# Patient Record
Sex: Female | Born: 1935 | Race: White | Hispanic: No | Smoking: Former smoker
Health system: Southern US, Community
[De-identification: ages and names within clinical notes are randomized; demographics above are authoritative.]

## PROBLEM LIST (undated history)

## (undated) DIAGNOSIS — F324 Major depressive disorder, single episode, in partial remission: Secondary | ICD-10-CM

## (undated) DIAGNOSIS — E785 Hyperlipidemia, unspecified: Secondary | ICD-10-CM

## (undated) DIAGNOSIS — F419 Anxiety disorder, unspecified: Secondary | ICD-10-CM

## (undated) DIAGNOSIS — E559 Vitamin D deficiency, unspecified: Secondary | ICD-10-CM

## (undated) DIAGNOSIS — A419 Sepsis, unspecified organism: Secondary | ICD-10-CM

## (undated) DIAGNOSIS — E1100 Type 2 diabetes mellitus with hyperosmolarity without nonketotic hyperglycemic-hyperosmolar coma (NKHHC): Secondary | ICD-10-CM

## (undated) DIAGNOSIS — J3089 Other allergic rhinitis: Secondary | ICD-10-CM

## (undated) DIAGNOSIS — B029 Zoster without complications: Secondary | ICD-10-CM

## (undated) DIAGNOSIS — L409 Psoriasis, unspecified: Secondary | ICD-10-CM

## (undated) DIAGNOSIS — T7840XA Allergy, unspecified, initial encounter: Secondary | ICD-10-CM

## (undated) HISTORY — DX: Other allergic rhinitis: J30.89

## (undated) HISTORY — PX: ABDOMINAL HYSTERECTOMY: SHX81

## (undated) HISTORY — PX: PARTIAL HYSTERECTOMY: SHX80

## (undated) HISTORY — DX: Vitamin D deficiency, unspecified: E55.9

## (undated) HISTORY — PX: EYE SURGERY: SHX253

## (undated) HISTORY — PX: BREAST BIOPSY: SHX20

## (undated) HISTORY — DX: Major depressive disorder, single episode, in partial remission: F32.4

## (undated) HISTORY — DX: Anxiety disorder, unspecified: F41.9

## (undated) HISTORY — DX: Hyperlipidemia, unspecified: E78.5

## (undated) HISTORY — PX: BREAST CYST EXCISION: SHX579

## (undated) HISTORY — DX: Type 2 diabetes mellitus with hyperosmolarity without nonketotic hyperglycemic-hyperosmolar coma (NKHHC): E11.00

## (undated) HISTORY — DX: Allergy, unspecified, initial encounter: T78.40XA

## (undated) HISTORY — DX: Psoriasis, unspecified: L40.9

## (undated) NOTE — *Deleted (*Deleted)
05/19/2020 5:00 PM   Jordan Montoya 23-Jan-1934 [id number]  Referring provider: Reubin Milan, MD [address] [place],  [place] [address] No chief complaint on file.   HPI: Jordan Montoya is a 29 y.o. female who returns for a 2 month follow up of UTI.   She was diagnosed with E. coli UTIs in March and June 2021 that presented with symptoms of dysuria, pelvic pain, urgency, and incontinence.   The patient last saw Dr. Richardo Hanks on 03/18/2020. Most recently she was started on Premarin cream, cranberry tablet prophylaxis, and Myrbetriq.  She reported 4 to 5 days of worsening urinary symptoms including urgency, incontinence, and dysuria consistent with prior UTI symptoms. Urinalysis was concerning for infection with 0 squamous cells, greater than 50 WBCs, 6-10 RBCs, no bacteria, small leukocytes, nitrite negative.  Sent for culture. He recommended Cipro 500 mg twice daily x5 days for suspected UTI.  Today, ***  PMH: Past Medical History:  Diagnosis Date  . Allergy   . Anxiety   . Avitaminosis D 11/25/2014  . Environmental and seasonal allergies 05/30/2015  . HLD (hyperlipidemia) 08/20/2011   Overview:  LDL goal <100 given DM2   . 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 Psoriasis 11/25/2014   followed by Dermatology   . Shingles 11/2018  . Type 2 diabetes mellitus with hyperosmolarity without coma, without long-term current use of insulin (HCC) 09/30/2015  . Unspecified septicemia(038.9) (HCC) 02/28/2019    Surgical History: Past Surgical History:  Procedure Laterality Date  . BREAST BIOPSY Left    neg-bx/clip  . BREAST CYST EXCISION Left    neg  . EYE SURGERY Bilateral    cataracts  . PARTIAL HYSTERECTOMY     one ovary remains    Home Medications:  Allergies as of 05/19/2020      Reactions   Metformin Nausea Only      Medication List       Accurate as of May 18, 2020  5:00 PM. If you have  any questions, ask your nurse or doctor.        acetaminophen 650 MG CR tablet Commonly known as: TYLENOL Take 650 mg by mouth every 8 (eight) hours as needed for pain.   ALPRAZolam 0.25 MG tablet Commonly known as: XANAX Take 1 tablet (0.25 mg total) by mouth daily as needed. for anxiety   aspirin 325 MG EC tablet Take 325 mg by mouth daily. As needed   Cosentyx Sensoready (300 MG) 150 MG/ML Soaj Generic drug: Secukinumab (300 MG Dose)   CRANBERRY PO Take 2 tablets by mouth daily.   docusate sodium 100 MG capsule Commonly known as: COLACE Take 200 mg by mouth 2 (two) times daily as needed for mild constipation.   ibuprofen 200 MG tablet Commonly known as: ADVIL Take 400 mg by mouth every 8 (eight) hours as needed.   Januvia 100 MG tablet Generic drug: sitaGLIPtin TAKE 1 TABLET (100 MG TOTAL) BY MOUTH DAILY.   metoprolol tartrate 25 MG tablet Commonly known as: LOPRESSOR Take 0.5 tablets by mouth daily.   mirabegron ER 25 MG Tb24 tablet Commonly known as: MYRBETRIQ Take 1 tablet (25 mg total) by mouth daily.   naproxen sodium 220 MG tablet Commonly known as: ALEVE Take 220 mg by mouth.   nitrofurantoin 50 MG capsule Commonly known as: MACRODANTIN Take 1 capsule (50 mg total) by mouth daily. Take daily for UTI prevention after completing course of Cipro  pregabalin 100 MG capsule Commonly known as: LYRICA Take 1 capsule (100 mg total) by mouth 2 (two) times daily.   Premarin vaginal cream Generic drug: conjugated estrogens Apply 0.5mg  (pea-sized amount)  just inside the vaginal introitus with a finger-tip on  Monday, Wednesday and Friday nights.   PROBIOTIC-10 PO Take by mouth.   traMADol-acetaminophen 37.5-325 MG tablet Commonly known as: ULTRACET Take 1 tablet by mouth 2 (two) times daily as needed.       Allergies:  Allergies  Allergen Reactions  . Metformin Nausea Only    Family History: Family History  Problem Relation Age of Onset  .  Cancer Mother   . Heart disease Father   . Diabetes Son   . Breast cancer Neg Hx     Social History:  reports that she quit smoking about 40 years ago. Her smoking use included cigarettes. She has a 5.00 pack-year smoking history. She has never used smokeless tobacco. She reports that she does not drink alcohol and does not use drugs.   Physical Exam: There were no vitals taken for this visit.  Constitutional:  Alert and oriented, No acute distress. HEENT: [place] AT, moist mucus membranes.  Trachea midline, no masses. Cardiovascular: No clubbing, cyanosis, or edema. Respiratory: Normal respiratory effort, no increased work of breathing. GI: Abdomen is soft, nontender, nondistended, no abdominal masses GU: No CVA tenderness Lymph: No cervical or inguinal lymphadenopathy. Skin: No rashes, bruises or suspicious lesions. Neurologic: Grossly intact, no focal deficits, moving all 4 extremities. Psychiatric: Normal mood and affect.  Laboratory Data:  Lab Results  Component Value Date   CREATININE 0.90 04/14/2020    No results found for: PSA  No results found for: TESTOSTERONE  Lab Results  Component Value Date   HGBA1C 6.5 (H) 04/02/2020    Urinalysis   Pertinent Imaging: *** No results found for this or any previous visit.  Results for orders placed during the hospital encounter of 02/28/19  US Venous Img Lower Bilateral  Narrative CLINICAL DATA:  Bilateral leg edema  EXAM: BILATERAL LOWER EXTREMITY VENOUS DOPPLER ULTRASOUND  TECHNIQUE: Gray-scale sonography with graded compression, as well as color Doppler and duplex ultrasound were performed to evaluate the lower extremity deep venous systems from the level of the common femoral vein and including the common femoral, femoral, profunda femoral, popliteal and calf veins including the posterior tibial, peroneal and gastrocnemius veins when visible. The superficial great saphenous vein was also interrogated. Spectral  Doppler was utilized to evaluate flow at rest and with distal augmentation maneuvers in the common femoral, femoral and popliteal veins.  COMPARISON:  None.  FINDINGS: RIGHT LOWER EXTREMITY  Common Femoral Vein: No evidence of thrombus. Normal compressibility, respiratory phasicity and response to augmentation.  Saphenofemoral Junction: No evidence of thrombus. Normal compressibility and flow on color Doppler imaging.  Profunda Femoral Vein: No evidence of thrombus. Normal compressibility and flow on color Doppler imaging.  Femoral Vein: No evidence of thrombus. Normal compressibility, respiratory phasicity and response to augmentation.  Popliteal Vein: No evidence of thrombus. Normal compressibility, respiratory phasicity and response to augmentation.  Calf Veins: No evidence of thrombus. Normal compressibility and flow on color Doppler imaging.  LEFT LOWER EXTREMITY  Common Femoral Vein: No evidence of thrombus. Normal compressibility, respiratory phasicity and response to augmentation.  Saphenofemoral Junction: No evidence of thrombus. Normal compressibility and flow on color Doppler imaging.  Profunda Femoral Vein: No evidence of thrombus. Normal compressibility and flow on color Doppler imaging.  Femoral Vein: No  evidence of thrombus. Normal compressibility, respiratory phasicity and response to augmentation.  Popliteal Vein: No evidence of thrombus. Normal compressibility, respiratory phasicity and response to augmentation.  Calf Veins: No evidence of thrombus. Normal compressibility and flow on color Doppler imaging.  IMPRESSION: No evidence of deep venous thrombosis in either lower extremity.   Electronically Signed By: Jasmine Pang M.D. On: 02/28/2019 03:27  No results found for this or any previous visit.  No results found for this or any previous visit.  No results found for this or any previous visit.  No results found for this or any previous  visit.  No results found for this or any previous visit.  No results found for this or any previous visit.   Assessment & Plan:     No follow-ups on file.  [facility] [address], [address] [place], [place] [address] [phone]  I, Theador Hawthorne, am acting as a Neurosurgeon for [employer],  {Add [employer] Statement}

---
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
## 2002-08-02 LAB — HM COLONOSCOPY

## 2010-08-02 LAB — HM DEXA SCAN: HM DEXA SCAN: NORMAL

## 2011-08-20 DIAGNOSIS — E1169 Type 2 diabetes mellitus with other specified complication: Secondary | ICD-10-CM | POA: Insufficient documentation

## 2011-08-20 DIAGNOSIS — E785 Hyperlipidemia, unspecified: Secondary | ICD-10-CM

## 2011-08-20 HISTORY — DX: Hyperlipidemia, unspecified: E78.5

## 2012-01-04 ENCOUNTER — Ambulatory Visit: Payer: Self-pay | Admitting: Family Medicine

## 2012-04-12 ENCOUNTER — Ambulatory Visit: Payer: Self-pay | Admitting: Ophthalmology

## 2012-05-29 ENCOUNTER — Ambulatory Visit: Payer: Self-pay | Admitting: Medical

## 2013-01-25 ENCOUNTER — Ambulatory Visit: Payer: Self-pay | Admitting: Internal Medicine

## 2013-12-31 LAB — HM MAMMOGRAPHY: HM MAMMO: NORMAL

## 2014-01-28 ENCOUNTER — Ambulatory Visit: Payer: Self-pay | Admitting: Internal Medicine

## 2014-01-29 ENCOUNTER — Ambulatory Visit: Payer: Self-pay | Admitting: Internal Medicine

## 2014-04-29 ENCOUNTER — Ambulatory Visit: Payer: Self-pay | Admitting: Anesthesiology

## 2014-04-29 LAB — CBC WITH DIFFERENTIAL/PLATELET
Basophil #: 0 10*3/uL (ref 0.0–0.1)
Basophil %: 0.6 %
Eosinophil #: 0.1 10*3/uL (ref 0.0–0.7)
Eosinophil %: 2.4 %
HCT: 36.3 % (ref 35.0–47.0)
HGB: 11.3 g/dL — AB (ref 12.0–16.0)
LYMPHS ABS: 2 10*3/uL (ref 1.0–3.6)
LYMPHS PCT: 32 %
MCH: 28.1 pg (ref 26.0–34.0)
MCHC: 31.2 g/dL — AB (ref 32.0–36.0)
MCV: 90 fL (ref 80–100)
MONO ABS: 0.6 x10 3/mm (ref 0.2–0.9)
MONOS PCT: 9.1 %
Neutrophil #: 3.5 10*3/uL (ref 1.4–6.5)
Neutrophil %: 55.9 %
Platelet: 245 10*3/uL (ref 150–440)
RBC: 4.03 10*6/uL (ref 3.80–5.20)
RDW: 13.9 % (ref 11.5–14.5)
WBC: 6.2 10*3/uL (ref 3.6–11.0)

## 2014-04-29 LAB — BASIC METABOLIC PANEL
ANION GAP: 10 (ref 7–16)
BUN: 15 mg/dL (ref 7–18)
CO2: 25 mmol/L (ref 21–32)
Calcium, Total: 8.9 mg/dL (ref 8.5–10.1)
Chloride: 106 mmol/L (ref 98–107)
Creatinine: 0.91 mg/dL (ref 0.60–1.30)
EGFR (Non-African Amer.): >60
GLUCOSE: 163 mg/dL — AB (ref 65–99)
Osmolality: 286 (ref 275–301)
POTASSIUM: 4.2 mmol/L (ref 3.5–5.1)
Sodium: 141 mmol/L (ref 136–145)

## 2014-05-02 ENCOUNTER — Ambulatory Visit: Payer: Self-pay | Admitting: Surgery

## 2014-05-03 LAB — PATHOLOGY REPORT

## 2014-05-07 ENCOUNTER — Ambulatory Visit: Payer: Self-pay | Admitting: Family Medicine

## 2014-10-04 LAB — HEMOGLOBIN A1C: HEMOGLOBIN A1C: 7.3 % — AB (ref 4.0–6.0)

## 2014-11-15 ENCOUNTER — Other Ambulatory Visit: Payer: Self-pay | Admitting: Internal Medicine

## 2014-11-15 DIAGNOSIS — Z1231 Encounter for screening mammogram for malignant neoplasm of breast: Secondary | ICD-10-CM

## 2014-11-23 NOTE — Op Note (Signed)
PATIENT NAME:  Jordan Montoya, Jordan Montoya MR#:  [id number] DATE OF BIRTH:  January 30, 1934  DATE OF PROCEDURE:  05/02/2014  PREOPERATIVE DIAGNOSIS: Left breast mass.   POSTOPERATIVE DIAGNOSIS: Left breast mass.   OPERATION: Excision of left breast mass.   ANESTHESIA: General.   SURGEON: Tiney Rougealph Ely III, MD.   OPERATIVE PROCEDURE: With the patient in the supine position after induction of appropriate general anesthesia the patient's left breast was prepped with ChloraPrep and draped with sterile towels. An elliptical incision was made around the palpable mass. The incision was carried down through the subcutaneous tissue with Bovie electrocautery down to the chest wall. The lesion was swept off the chest without difficulty. It was marked with margin markers. It was closed with a combination of 3 and 4-0 nylon. Sterile dressings were applied. The patient was returned to the recovery room, having tolerated the procedure well. Sponge and instrument counts were correct x 2 in the operating room.     ____________________________ Carmie Endalph L. Ely III, MD rle:bu D: 05/02/2014 10:31:00 ET T: 05/02/2014 12:58:21 ET JOB#: [id number]  cc: Bari EdwardLaura Berglund, MD Quentin Orealph L. Ely III, MD, <Dictator>  Quentin OreALPH L ELY MD ELECTRONICALLY SIGNED 05/03/2014 17:52

## 2014-11-25 ENCOUNTER — Encounter: Payer: Self-pay | Admitting: Internal Medicine

## 2014-11-25 DIAGNOSIS — L409 Psoriasis, unspecified: Secondary | ICD-10-CM

## 2014-11-25 DIAGNOSIS — E119 Type 2 diabetes mellitus without complications: Secondary | ICD-10-CM | POA: Insufficient documentation

## 2014-11-25 DIAGNOSIS — E559 Vitamin D deficiency, unspecified: Secondary | ICD-10-CM

## 2014-11-25 DIAGNOSIS — F324 Major depressive disorder, single episode, in partial remission: Secondary | ICD-10-CM

## 2014-11-25 DIAGNOSIS — Z1239 Encounter for other screening for malignant neoplasm of breast: Secondary | ICD-10-CM | POA: Insufficient documentation

## 2014-11-25 HISTORY — DX: Major depressive disorder, single episode, in partial remission: F32.4

## 2014-11-25 HISTORY — DX: Vitamin D deficiency, unspecified: E55.9

## 2014-11-25 HISTORY — DX: Psoriasis, unspecified: L40.9

## 2015-01-13 DIAGNOSIS — L400 Psoriasis vulgaris: Secondary | ICD-10-CM | POA: Diagnosis not present

## 2015-01-16 DIAGNOSIS — L400 Psoriasis vulgaris: Secondary | ICD-10-CM | POA: Diagnosis not present

## 2015-01-24 ENCOUNTER — Other Ambulatory Visit: Payer: Self-pay | Admitting: Internal Medicine

## 2015-01-27 ENCOUNTER — Other Ambulatory Visit: Payer: Self-pay | Admitting: Internal Medicine

## 2015-01-27 ENCOUNTER — Encounter: Payer: Self-pay | Admitting: Internal Medicine

## 2015-01-27 ENCOUNTER — Ambulatory Visit (INDEPENDENT_AMBULATORY_CARE_PROVIDER_SITE_OTHER): Payer: Medicare PPO | Admitting: Internal Medicine

## 2015-01-27 ENCOUNTER — Encounter (INDEPENDENT_AMBULATORY_CARE_PROVIDER_SITE_OTHER): Payer: Self-pay

## 2015-01-27 VITALS — BP 140/60 | HR 84 | Ht 66.5 in | Wt 165.2 lb

## 2015-01-27 DIAGNOSIS — F419 Anxiety disorder, unspecified: Secondary | ICD-10-CM

## 2015-01-27 DIAGNOSIS — M25551 Pain in right hip: Secondary | ICD-10-CM

## 2015-01-27 DIAGNOSIS — E119 Type 2 diabetes mellitus without complications: Secondary | ICD-10-CM

## 2015-01-27 DIAGNOSIS — F329 Major depressive disorder, single episode, unspecified: Secondary | ICD-10-CM

## 2015-01-27 DIAGNOSIS — G8929 Other chronic pain: Secondary | ICD-10-CM | POA: Insufficient documentation

## 2015-01-27 DIAGNOSIS — F32A Depression, unspecified: Secondary | ICD-10-CM

## 2015-01-27 DIAGNOSIS — Z79899 Other long term (current) drug therapy: Secondary | ICD-10-CM | POA: Diagnosis not present

## 2015-01-27 DIAGNOSIS — E559 Vitamin D deficiency, unspecified: Secondary | ICD-10-CM | POA: Diagnosis not present

## 2015-01-27 DIAGNOSIS — E785 Hyperlipidemia, unspecified: Secondary | ICD-10-CM

## 2015-01-27 DIAGNOSIS — M25559 Pain in unspecified hip: Secondary | ICD-10-CM

## 2015-01-27 LAB — POCT URINALYSIS DIPSTICK
BILIRUBIN UA: NEGATIVE
GLUCOSE UA: NEGATIVE
Ketones, UA: NEGATIVE
LEUKOCYTES UA: NEGATIVE
Nitrite, UA: NEGATIVE
Protein, UA: NEGATIVE
RBC UA: NEGATIVE
Spec Grav, UA: 1.02
UROBILINOGEN UA: 0.2
pH, UA: 5

## 2015-01-27 MED ORDER — CLORAZEPATE DIPOTASSIUM 3.75 MG PO TABS: 3.7500 mg | ORAL_TABLET | Freq: Two times a day (BID) | ORAL | Status: DC | PRN

## 2015-01-27 NOTE — Progress Notes (Signed)
Patient: Jordan Montoya, Female    DOB: 1934-01-04, 81 y.o.   MRN: [id number] Visit Date: 01/27/2015  Today's Provider: Bari Edward, MD   Chief Complaint  Patient presents with  . Medicare Wellness   Subjective:    Annual wellness visit Jordan Montoya is a 81 y.o. female who presents today for her Subsequent Annual Wellness Visit. She feels fairly well. She reports exercising -none due to low energy. She reports she is sleeping fairly well.   ----------------------------------------------------------- Diabetes She presents for her follow-up diabetic visit. She has type 2 diabetes mellitus. Her disease course has been stable. Hypoglycemia symptoms include nervousness/anxiousness. Pertinent negatives for hypoglycemia include no headaches or speech difficulty. Associated symptoms include fatigue. Pertinent negatives for diabetes include no blurred vision, no chest pain, no foot paresthesias, no polydipsia and no polyuria. Symptoms are stable. Current diabetic treatment includes oral agent (monotherapy). She is compliant with treatment all of the time. She is following a generally healthy, diabetic and low fat/cholesterol diet. She never participates in exercise. There is no change in her home blood glucose trend. Her breakfast blood glucose range is generally 110-130 mg/dl. An ACE inhibitor/angiotensin II receptor blocker is not being taken. Eye exam is not current.  Anxiety Presents for follow-up visit. The problem has been waxing and waning. Symptoms include nervous/anxious behavior. Patient reports no chest pain, decreased concentration, palpitations or shortness of breath. Symptoms occur most days. The severity of symptoms is moderate. The symptoms are aggravated by family issues and social activities. The quality of sleep is fair.   Her past medical history is significant for depression. There is no history of anxiety/panic attacks. Past treatments include benzodiazephines (took  medication for a while when her husband was very sick - she is not sure what is was.).  Hip Pain  There was no injury mechanism. The pain is present in the right hip. The quality of the pain is described as aching and cramping. The pain is moderate. The pain has been fluctuating since onset. Associated symptoms include a loss of motion. Pertinent negatives include no muscle weakness or numbness. The symptoms are aggravated by movement. She has tried NSAIDs for the symptoms. The treatment provided moderate relief.  Psoriasis She is now back on Enbrel.  Rash is widespread but improving.  She also complains of some low back pain and right hip pain.  She has not been evaluated but believes that she has some psoriatic arthritis.  She uses Advil as needed.  Tylenol is of no benefit.  Review of Systems  Constitutional: Positive for fatigue. Negative for fever, appetite change and unexpected weight change.  HENT: Negative for hearing loss, trouble swallowing and voice change.   Eyes: Negative for blurred vision and visual disturbance.  Respiratory: Negative for cough, shortness of breath and stridor.   Cardiovascular: Negative for chest pain, palpitations and leg swelling.  Gastrointestinal: Negative for abdominal pain.  Endocrine: Negative for polydipsia and polyuria.  Genitourinary: Negative for dysuria and pelvic pain.  Musculoskeletal: Positive for arthralgias (right hip). Negative for myalgias, back pain and joint swelling.  Skin: Positive for rash.  Neurological: Negative for speech difficulty, light-headedness, numbness and headaches.  Psychiatric/Behavioral: Negative for hallucinations, sleep disturbance and decreased concentration. The patient is nervous/anxious.     History   Social History  . Marital Status: Widowed    Spouse Name: N/A  . Number of Children: N/A  . Years of Education: N/A   Occupational History  . Not on file.  Social History Main Topics  . Smoking status: Never  Smoker   . Smokeless tobacco: Not on file  . Alcohol Use: No  . Drug Use: No  . Sexual Activity: Not on file   Other Topics Concern  . Not on file   Social History Narrative    Patient Active Problem List   Diagnosis Date Noted  . Hip pain, chronic 01/27/2015  . Breast screening 11/25/2014  . Clinical depression 11/25/2014  . Avitaminosis D 11/25/2014  . Psoriasis 11/25/2014  . Diabetes mellitus, type 2 11/25/2014  . 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 HLD (hyperlipidemia) 08/20/2011    Past Surgical History  Procedure Laterality Date  . Partial hysterectomy      one ovary remains  . Eye surgery Bilateral     cataracts    Her family history includes Cancer in her mother; Diabetes in her son; Heart disease in her father.    Previous Medications   ASPIRIN 81 MG CHEWABLE TABLET    Chew 1 tablet by mouth daily.   CHOLECALCIFEROL (VITAMIN D) 1000 UNITS TABLET    Take 1 tablet by mouth daily.   CLOBETASOL PROPIONATE EMULSION 0.05 % TOPICAL FOAM    Apply 1 application topically daily.   CYANOCOBALAMIN 1000 MCG TABLET    Take 1 tablet by mouth daily.   ENBREL 50 MG/ML INJECTION       MECLIZINE (ANTIVERT) 25 MG TABLET    Take 1 tablet by mouth 3 (three) times daily as needed.   PAROXETINE (PAXIL) 30 MG TABLET    Take 0.5 tablets by mouth daily.    SITAGLIPTIN (JANUVIA) 100 MG TABLET    Take 1 tablet by mouth daily.    Patient Care Team: Reubin Milan, MD as PCP - General (Family Medicine)     Objective:   Vitals: BP 140/60 mmHg  Pulse 84  Ht 5' 6.5" (1.689 m)  Wt 165 lb 3.2 oz (74.934 kg)  BMI 26.27 kg/m2  Physical Exam  Constitutional: She is oriented to person, place, and time. She appears well-developed. No distress.  HENT:  Head: Normocephalic and atraumatic.  Eyes: Conjunctivae are normal. Right eye exhibits no discharge. Left eye exhibits no discharge. No scleral icterus.  Neck: Normal range of motion. Neck supple. No thyromegaly  present.  Cardiovascular: Normal rate, regular rhythm and normal heart sounds.   Pulmonary/Chest: Effort normal. No respiratory distress. She has no wheezes.  Abdominal: Soft. Bowel sounds are normal. She exhibits no mass. There is no tenderness. There is no guarding.  Genitourinary: No breast swelling, tenderness, discharge or bleeding.  Musculoskeletal:       Right hip: She exhibits decreased range of motion and tenderness. She exhibits no swelling and no crepitus.  Lymphadenopathy:    She has no cervical adenopathy.    She has no axillary adenopathy.  Neurological: She is alert and oriented to person, place, and time. She has normal strength. No sensory deficit.  Reflex Scores:      Patellar reflexes are 2+ on the right side and 2+ on the left side. Skin: Skin is warm and dry. No rash noted.  Scattered scaly patches over back and legs c/w psoriasis  Psychiatric: Her speech is normal and behavior is normal. Thought content normal. Her mood appears anxious. Cognition and memory are normal. She does not exhibit a depressed mood.    Activities of Daily Living In your present state of health, do you have any difficulty performing the following activities:  01/27/2015  Hearing? N  Vision? N  Difficulty concentrating or making decisions? N  Walking or climbing stairs? N  Dressing or bathing? N  Doing errands, shopping? N    Fall Risk Assessment Fall Risk  01/27/2015  Falls in the past year? No     Patient reports there are safety devices in place in shower at home.   Depression Screen PHQ 2/9 Scores 01/27/2015  PHQ - 2 Score 0    Cognitive Testing - 6-CIT   Correct? Score   What year is it? yes 0 Yes = 0    No = 4  What month is it? yes 0 Yes = 0    No = 3  Remember:     [place]     What time is it? yes 0 Yes = 0    No = 3  Count backwards from 20 to 1 yes 0 Correct = 0    1 error = 2   More than 1 error = 4  Say the months of the year in reverse.  yes 0 Correct = 0    1 error = 2   More than 1 error = 4  What address did I ask you to remember? yes 2 Correct = 0  1 error = 2    2 error = 4    3 error = 6    4 error = 8    All wrong = 10       TOTAL SCORE  2/28   Interpretation:  Normal  Normal (0-7) Abnormal (8-28)        Assessment & Plan:     Annual Wellness Visit  Reviewed patient's Family Medical History Reviewed and updated list of patient's medical providers Assessment of cognitive impairment was done Assessed patient's functional ability Established a written schedule for health screening services Health Risk Assessent Completed and Reviewed  Exercise Activities and Dietary recommendations Goals    None      Immunization History  Administered Date(s) Administered  . Pneumococcal Conjugate-13 06/06/2014  . Tdap 08/03/2011    Health Maintenance  Topic Date Due  . FOOT EXAM  09/25/1945  . URINE MICROALBUMIN  09/25/1945  . ZOSTAVAX  09/26/1995  . COLONOSCOPY  08/02/2012  . OPHTHALMOLOGY EXAM  11/30/2013  . INFLUENZA VACCINE  03/03/2015  . HEMOGLOBIN A1C  04/06/2015  . PNA vac Low Risk Adult (2 of 2 - PPSV23) 06/07/2015  . TETANUS/TDAP  08/02/2021  . DEXA SCAN  Completed     Discussed health benefits of physical activity, and encouraged her to engage in regular exercise appropriate for her age and condition.    ------------------------------------------------------------------------------------------------------------  1. Type 2 diabetes mellitus without complication - Comprehensive metabolic panel - Hemoglobin A1c - TSH - POCT urinalysis dipstick - Microalbumin / creatinine urine ratio Pt encouraged to schedule Eye exam  2. Clinical depression Continue paxil  3. Avitaminosis D Continue vitamin D supplement - Vitamin D 1,25 dihydroxy  4. HLD (hyperlipidemia) Continue low fat diet - Lipid panel  5. Acute anxiety Will begin tranxene bid prn - clorazepate (TRANXENE) 3.75 MG tablet; Take 1  tablet (3.75 mg total) by mouth 2 (two) times daily as needed for anxiety.  Dispense: 60 tablet; Refill: 5  6. Long term use of drug Enbrel for psoriasis  Right hip pain may be OA or psoriatic - continue Advil 200 mg bid prn - avoid high doses daily - CBC with Differential/Platelet  Bari EdwardLaura Husam Hohn, MD [facility]  01/27/2015

## 2015-01-27 NOTE — Patient Instructions (Addendum)
Health Maintenance  Topic Date Due  . FOOT EXAM  Done today  . URINE MICROALBUMIN  Done today  . ZOSTAVAX  09/26/1995  . COLONOSCOPY  08/02/2012  . OPHTHALMOLOGY EXAM  due  . INFLUENZA VACCINE  03/03/2015  . HEMOGLOBIN A1C  04/06/2015  . PNA vac Low Risk Adult (2 of 2 - PPSV23) done  . TETANUS/TDAP  08/02/2021  . DEXA SCAN  Completed     For hip/leg pain - take advil 200 mg up to twice per day.  Avoid taking high doses regularly.

## 2015-01-28 LAB — LIPID PANEL
CHOLESTEROL TOTAL: 209 mg/dL — AB (ref 100–199)
Chol/HDL Ratio: 3 ratio units (ref 0.0–4.4)
HDL: 70 mg/dL (ref >39)
LDL CALC: 112 mg/dL — AB (ref 0–99)
Triglycerides: 136 mg/dL (ref 0–149)
VLDL CHOLESTEROL CAL: 27 mg/dL (ref 5–40)

## 2015-01-28 LAB — COMPREHENSIVE METABOLIC PANEL
ALBUMIN: 4.5 g/dL (ref 3.5–4.8)
ALT: 14 IU/L (ref 0–32)
AST: 17 IU/L (ref 0–40)
Albumin/Globulin Ratio: 1.6 (ref 1.1–2.5)
Alkaline Phosphatase: 77 IU/L (ref 39–117)
BUN/Creatinine Ratio: 28 — ABNORMAL HIGH (ref 11–26)
BUN: 19 mg/dL (ref 8–27)
Bilirubin Total: 0.4 mg/dL (ref 0.0–1.2)
CHLORIDE: 103 mmol/L (ref 97–108)
CO2: 24 mmol/L (ref 18–29)
Calcium: 9.3 mg/dL (ref 8.7–10.3)
Creatinine, Ser: 0.68 mg/dL (ref 0.57–1.00)
GFR calc Af Amer: 96 mL/min/{1.73_m2} (ref >59)
GFR, EST NON AFRICAN AMERICAN: 83 mL/min/{1.73_m2} (ref >59)
GLOBULIN, TOTAL: 2.9 g/dL (ref 1.5–4.5)
GLUCOSE: 136 mg/dL — AB (ref 65–99)
Potassium: 4.6 mmol/L (ref 3.5–5.2)
Sodium: 144 mmol/L (ref 134–144)
TOTAL PROTEIN: 7.4 g/dL (ref 6.0–8.5)

## 2015-01-28 LAB — CBC WITH DIFFERENTIAL/PLATELET
BASOS: 0 %
Basophils Absolute: 0 10*3/uL (ref 0.0–0.2)
EOS (ABSOLUTE): 0.1 10*3/uL (ref 0.0–0.4)
Eos: 1 %
HEMOGLOBIN: 12.3 g/dL (ref 11.1–15.9)
Hematocrit: 37.9 % (ref 34.0–46.6)
IMMATURE GRANS (ABS): 0 10*3/uL (ref 0.0–0.1)
Immature Granulocytes: 0 %
Lymphocytes Absolute: 2 10*3/uL (ref 0.7–3.1)
Lymphs: 41 %
MCH: 29 pg (ref 26.6–33.0)
MCHC: 32.5 g/dL (ref 31.5–35.7)
MCV: 89 fL (ref 79–97)
MONOS ABS: 0.4 10*3/uL (ref 0.1–0.9)
Monocytes: 9 %
NEUTROS ABS: 2.4 10*3/uL (ref 1.4–7.0)
Neutrophils: 49 %
Platelets: 258 10*3/uL (ref 150–379)
RBC: 4.24 x10E6/uL (ref 3.77–5.28)
RDW: 14.7 % (ref 12.3–15.4)
WBC: 4.9 10*3/uL (ref 3.4–10.8)

## 2015-01-28 LAB — HEMOGLOBIN A1C
Est. average glucose Bld gHb Est-mCnc: 157 mg/dL
HEMOGLOBIN A1C: 7.1 % — AB (ref 4.8–5.6)

## 2015-01-28 LAB — MICROALBUMIN / CREATININE URINE RATIO
Creatinine, Urine: 198.8 mg/dL
MICROALB/CREAT RATIO: 7.1 mg/g creat (ref 0.0–30.0)
MICROALBUM., U, RANDOM: 14.2 ug/mL

## 2015-01-28 LAB — TSH: TSH: 2.94 u[IU]/mL (ref 0.450–4.500)

## 2015-01-29 ENCOUNTER — Other Ambulatory Visit: Payer: Self-pay | Admitting: Internal Medicine

## 2015-02-01 LAB — VITAMIN D 1,25 DIHYDROXY
VITAMIN D3 1, 25 (OH): 61 pg/mL
Vitamin D 1, 25 (OH)2 Total: 63 pg/mL
Vitamin D2 1, 25 (OH)2: <10 pg/mL

## 2015-02-04 ENCOUNTER — Ambulatory Visit
Admission: RE | Admit: 2015-02-04 | Discharge: 2015-02-04 | Disposition: A | Discharge status: Home / Self Care | Payer: Medicare PPO | Source: Ambulatory Visit | Attending: Internal Medicine | Admitting: Internal Medicine

## 2015-02-04 DIAGNOSIS — Z1231 Encounter for screening mammogram for malignant neoplasm of breast: Secondary | ICD-10-CM | POA: Insufficient documentation

## 2015-03-07 DIAGNOSIS — E119 Type 2 diabetes mellitus without complications: Secondary | ICD-10-CM | POA: Diagnosis not present

## 2015-03-10 LAB — HM DIABETES EYE EXAM

## 2015-04-03 DIAGNOSIS — Z79899 Other long term (current) drug therapy: Secondary | ICD-10-CM | POA: Diagnosis not present

## 2015-04-03 DIAGNOSIS — L400 Psoriasis vulgaris: Secondary | ICD-10-CM | POA: Diagnosis not present

## 2015-05-01 ENCOUNTER — Other Ambulatory Visit: Payer: Self-pay | Admitting: Internal Medicine

## 2015-05-01 ENCOUNTER — Other Ambulatory Visit: Payer: Self-pay

## 2015-05-01 MED ORDER — SITAGLIPTIN PHOSPHATE 100 MG PO TABS: 100.0000 mg | ORAL_TABLET | Freq: Every day | ORAL | Status: DC

## 2015-05-01 MED ORDER — PAROXETINE HCL 30 MG PO TABS: 15.0000 mg | ORAL_TABLET | Freq: Every day | ORAL | Status: DC

## 2015-05-30 ENCOUNTER — Encounter: Payer: Self-pay | Admitting: Internal Medicine

## 2015-05-30 ENCOUNTER — Ambulatory Visit (INDEPENDENT_AMBULATORY_CARE_PROVIDER_SITE_OTHER): Payer: Medicare PPO | Admitting: Internal Medicine

## 2015-05-30 VITALS — BP 140/62 | HR 76 | Ht 66.0 in | Wt 169.0 lb

## 2015-05-30 DIAGNOSIS — F32A Depression, unspecified: Secondary | ICD-10-CM

## 2015-05-30 DIAGNOSIS — J3089 Other allergic rhinitis: Secondary | ICD-10-CM | POA: Diagnosis not present

## 2015-05-30 DIAGNOSIS — F329 Major depressive disorder, single episode, unspecified: Secondary | ICD-10-CM

## 2015-05-30 DIAGNOSIS — E119 Type 2 diabetes mellitus without complications: Secondary | ICD-10-CM | POA: Diagnosis not present

## 2015-05-30 HISTORY — DX: Other allergic rhinitis: J30.89

## 2015-05-30 MED ORDER — FLUTICASONE PROPIONATE 50 MCG/ACT NA SUSP: 2.0000 | Freq: Every day | NASAL | Status: DC

## 2015-05-30 NOTE — Progress Notes (Signed)
Date:  05/30/2015   Name:  Jordan Montoya   DOB:  01-Jul-1934   MRN:  [id number]   Chief Complaint: Diabetes and Depression Diabetes She presents for her follow-up diabetic visit. She has type 2 diabetes mellitus. Her disease course has been stable (He feels that her blood sugars are slightly higher sometimes up to 220 with no change in her diet). Hypoglycemia symptoms include dizziness. Pertinent negatives for hypoglycemia include no headaches. Pertinent negatives for diabetes include no chest pain, no fatigue, no polydipsia and no polyuria. Symptoms are stable. Current diabetic treatment includes oral agent (monotherapy). She is compliant with treatment most of the time. Her breakfast blood glucose is taken between 7-8 am. Her breakfast blood glucose range is generally 140-180 mg/dl. (Testing twice a day)  Depression        This is a chronic problem.  The current episode started more than 1 year ago.   The problem occurs rarely.  Associated symptoms include no fatigue, does not have insomnia, no decreased interest, no appetite change and no headaches.  Past treatments include SSRIs - Selective serotonin reuptake inhibitors and TCAs - Tricyclic antidepressants.  Compliance with treatment is good.  sinus congestion - Patient complains of ongoing sinus congestion and clear postnasal drip. She also has intermittent vertigo. The vertigo has been fully investigated by ENT.  She uses meclizine as needed.  She is wondering if her congestion might be contributing to her symptoms.     Review of Systems  Constitutional: Negative for chills, appetite change and fatigue.  HENT: Positive for congestion, postnasal drip and rhinorrhea. Negative for ear pain, sore throat, trouble swallowing and voice change.   Eyes: Negative for visual disturbance.  Respiratory: Negative for cough and shortness of breath.   Cardiovascular: Negative for chest pain, palpitations and leg swelling.  Endocrine: Negative for  polydipsia and polyuria.  Genitourinary: Negative for urgency.  Skin: Negative for rash.  Neurological: Positive for dizziness. Negative for syncope, numbness and headaches.  Psychiatric/Behavioral: Positive for depression. The patient does not have insomnia.     Patient Active Problem List   Diagnosis Date Noted  . Hip pain, chronic 01/27/2015  . Breast screening 11/25/2014  . Clinical depression 11/25/2014  . Avitaminosis D 11/25/2014  . Psoriasis 11/25/2014  . Diabetes mellitus, type 2 (HCC) 11/25/2014  . 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 HLD (hyperlipidemia) 08/20/2011    Prior to Admission medications   Medication Sig Start Date End Date Taking? Authorizing Provider  aspirin 81 MG chewable tablet Chew 1 tablet by mouth daily.   Yes Historical Provider, MD  clorazepate (TRANXENE) 3.75 MG tablet Take 1 tablet (3.75 mg total) by mouth 2 (two) times daily as needed for anxiety. 01/27/15  Yes Reubin Milan, MD  ENBREL 50 MG/ML injection  01/21/15  Yes Historical Provider, MD  meclizine (ANTIVERT) 25 MG tablet Take 1 tablet by mouth 3 (three) times daily as needed. 01/23/14  Yes Historical Provider, MD  PARoxetine (PAXIL) 30 MG tablet Take 0.5 tablets (15 mg total) by mouth daily. 05/01/15  Yes Reubin Milan, MD  sitaGLIPtin (JANUVIA) 100 MG tablet Take 1 tablet (100 mg total) by mouth daily. 05/01/15  Yes Reubin Milan, MD  cholecalciferol (VITAMIN D) 1000 UNITS tablet Take 1 tablet by mouth daily.    Historical Provider, MD  Clobetasol Propionate Emulsion 0.05 % topical foam Apply 1 application topically daily. 11/25/14   Historical Provider, MD  cyanocobalamin 1000 MCG tablet Take 1 tablet  by mouth daily.    Historical Provider, MD    Allergies  Allergen Reactions  . Metformin Nausea Only    Past Surgical History  Procedure Laterality Date  . Partial hysterectomy      one ovary remains  . Eye surgery Bilateral     cataracts  . Abdominal hysterectomy     . Breast biopsy Left     neg-bx/clip    Social History  Substance Use Topics  . Smoking status: Never Smoker   . Smokeless tobacco: None  . Alcohol Use: No     Medication list has been reviewed and updated.   Physical Exam  Constitutional: She is oriented to person, place, and time. She appears well-developed and well-nourished.  HENT:  Right Ear: Tympanic membrane and ear canal normal.  Left Ear: Tympanic membrane and ear canal normal.  Nose: Right sinus exhibits no maxillary sinus tenderness and no frontal sinus tenderness. Left sinus exhibits no maxillary sinus tenderness and no frontal sinus tenderness.  Mouth/Throat: Uvula is midline and oropharynx is clear and moist. No posterior oropharyngeal erythema.  Eyes: Pupils are equal, round, and reactive to light.  Neck: Neck supple. No thyromegaly present.  Cardiovascular: Normal rate and regular rhythm.   Pulmonary/Chest: Effort normal and breath sounds normal. She has no wheezes.  Musculoskeletal: She exhibits no edema or tenderness.  Neurological: She is alert and oriented to person, place, and time.  Psychiatric: She has a normal mood and affect. Her behavior is normal.  Nursing note and vitals reviewed.   BP 140/62 mmHg  Pulse 76  Ht 5\' 6"  (1.676 m)  Wt 169 lb (76.658 kg)  BMI 27.29 kg/m2  Assessment and Plan: 1. Type 2 diabetes mellitus without complication, without long-term current use of insulin (HCC) Will advise if medication adjustment is needed after labs return - Hemoglobin A1c - Basic metabolic panel  2. Clinical depression Doing well on current medications  3. Environmental and seasonal allergies Begin Flonase daily to determine if this will help relieve mild vertigo symptoms - fluticasone (FLONASE) 50 MCG/ACT nasal spray; Place 2 sprays into both nostrils daily.  Dispense: 16 g; Refill: 6   Bari EdwardLaura Shandra Szymborski, MD [facility]  05/30/2015

## 2015-05-31 ENCOUNTER — Other Ambulatory Visit: Payer: Self-pay | Admitting: Internal Medicine

## 2015-05-31 LAB — HEMOGLOBIN A1C
ESTIMATED AVERAGE GLUCOSE: 180 mg/dL
Hgb A1c MFr Bld: 7.9 % — ABNORMAL HIGH (ref 4.8–5.6)

## 2015-05-31 LAB — BASIC METABOLIC PANEL
BUN / CREAT RATIO: 23 (ref 11–26)
BUN: 17 mg/dL (ref 8–27)
CO2: 25 mmol/L (ref 18–29)
CREATININE: 0.74 mg/dL (ref 0.57–1.00)
Calcium: 9.4 mg/dL (ref 8.7–10.3)
Chloride: 100 mmol/L (ref 97–106)
GFR, EST AFRICAN AMERICAN: 89 mL/min/{1.73_m2} (ref >59)
GFR, EST NON AFRICAN AMERICAN: 77 mL/min/{1.73_m2} (ref >59)
Glucose: 143 mg/dL — ABNORMAL HIGH (ref 65–99)
Potassium: 4.6 mmol/L (ref 3.5–5.2)
Sodium: 140 mmol/L (ref 136–144)

## 2015-05-31 MED ORDER — GLIMEPIRIDE 2 MG PO TABS: 2.0000 mg | ORAL_TABLET | Freq: Every day | ORAL | Status: DC

## 2015-07-08 DIAGNOSIS — Z79899 Other long term (current) drug therapy: Secondary | ICD-10-CM | POA: Diagnosis not present

## 2015-07-08 DIAGNOSIS — L400 Psoriasis vulgaris: Secondary | ICD-10-CM | POA: Diagnosis not present

## 2015-09-01 ENCOUNTER — Encounter: Payer: Self-pay | Admitting: Internal Medicine

## 2015-09-01 ENCOUNTER — Ambulatory Visit (INDEPENDENT_AMBULATORY_CARE_PROVIDER_SITE_OTHER): Payer: Medicare Other | Admitting: Internal Medicine

## 2015-09-01 VITALS — BP 156/68 | HR 76 | Temp 97.5°F | Ht 66.0 in | Wt 171.4 lb

## 2015-09-01 DIAGNOSIS — J019 Acute sinusitis, unspecified: Secondary | ICD-10-CM

## 2015-09-01 DIAGNOSIS — E119 Type 2 diabetes mellitus without complications: Secondary | ICD-10-CM | POA: Insufficient documentation

## 2015-09-01 MED ORDER — PSEUDOEPHEDRINE HCL 30 MG PO TABS: 30.0000 mg | ORAL_TABLET | Freq: Three times a day (TID) | ORAL | Status: DC | PRN

## 2015-09-01 MED ORDER — METHYLPREDNISOLONE 4 MG PO TBPK: ORAL_TABLET | ORAL | Status: DC

## 2015-09-01 MED ORDER — AMOXICILLIN-POT CLAVULANATE 875-125 MG PO TABS: 1.0000 | ORAL_TABLET | Freq: Two times a day (BID) | ORAL | Status: DC

## 2015-09-01 NOTE — Progress Notes (Signed)
Date:  09/01/2015   Name:  Jordan Montoya   DOB:  1933/10/09   MRN:  [id number]   Chief Complaint: Sinusitis Sinusitis This is a new problem. The current episode started 1 to 4 weeks ago. The problem is unchanged. There has been no fever. The pain is mild. Associated symptoms include congestion, ear pain, headaches, a hoarse voice and sinus pressure. Pertinent negatives include no chills, coughing, neck pain, shortness of breath, sneezing or sore throat. Past treatments include acetaminophen and spray decongestants. The treatment provided mild relief.     Review of Systems  Constitutional: Negative for fever and chills.  HENT: Positive for congestion, ear pain, hoarse voice and sinus pressure. Negative for sneezing and sore throat.   Respiratory: Negative for cough, shortness of breath and wheezing.   Cardiovascular: Negative for chest pain.  Musculoskeletal: Negative for neck pain and neck stiffness.  Neurological: Positive for dizziness and headaches. Negative for syncope and numbness.    Patient Active Problem List   Diagnosis Date Noted  . Environmental and seasonal allergies 05/30/2015  . Hip pain, chronic 01/27/2015  . Breast screening 11/25/2014  . Clinical depression 11/25/2014  . Avitaminosis D 11/25/2014  . Psoriasis 11/25/2014  . Diabetes mellitus, type 2 (HCC) 11/25/2014  . 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 HLD (hyperlipidemia) 08/20/2011    Prior to Admission medications   Medication Sig Start Date End Date Taking? Authorizing Provider  aspirin 81 MG chewable tablet Chew 1 tablet by mouth daily.   Yes Historical Provider, MD  cholecalciferol (VITAMIN D) 1000 UNITS tablet Take 1 tablet by mouth daily.   Yes Historical Provider, MD  Clobetasol Propionate Emulsion 0.05 % topical foam Apply 1 application topically daily. 11/25/14  Yes Historical Provider, MD  clorazepate (TRANXENE) 3.75 MG tablet Take 1 tablet (3.75 mg total) by mouth 2 (two)  times daily as needed for anxiety. 01/27/15  Yes Reubin Milan, MD  cyanocobalamin 1000 MCG tablet Take 1 tablet by mouth daily.   Yes Historical Provider, MD  ENBREL 50 MG/ML injection  01/21/15  Yes Historical Provider, MD  fluticasone (FLONASE) 50 MCG/ACT nasal spray Place 2 sprays into both nostrils daily. 05/30/15  Yes Reubin Milan, MD  glimepiride (AMARYL) 2 MG tablet Take 1 tablet (2 mg total) by mouth daily before breakfast. 05/31/15  Yes Reubin Milan, MD  meclizine (ANTIVERT) 25 MG tablet Take 1 tablet by mouth 3 (three) times daily as needed. 01/23/14  Yes Historical Provider, MD  PARoxetine (PAXIL) 30 MG tablet Take 0.5 tablets (15 mg total) by mouth daily. 05/01/15  Yes Reubin Milan, MD  sitaGLIPtin (JANUVIA) 100 MG tablet Take 1 tablet (100 mg total) by mouth daily. 05/01/15  Yes Reubin Milan, MD    Allergies  Allergen Reactions  . Metformin Nausea Only    Past Surgical History  Procedure Laterality Date  . Partial hysterectomy      one ovary remains  . Eye surgery Bilateral     cataracts  . Abdominal hysterectomy    . Breast biopsy Left     neg-bx/clip    Social History  Substance Use Topics  . Smoking status: Never Smoker   . Smokeless tobacco: None  . Alcohol Use: No     Medication list has been reviewed and updated.   Physical Exam  Constitutional: She is oriented to person, place, and time. She appears well-developed and well-nourished.  HENT:  Right Ear: External ear and ear  canal normal. Tympanic membrane is retracted. Tympanic membrane is not erythematous.  Left Ear: External ear and ear canal normal. Tympanic membrane is retracted. Tympanic membrane is not erythematous.  Nose: Right sinus exhibits maxillary sinus tenderness and frontal sinus tenderness. Left sinus exhibits maxillary sinus tenderness and frontal sinus tenderness.  Mouth/Throat: Uvula is midline and mucous membranes are normal. No oral lesions. No oropharyngeal exudate,  posterior oropharyngeal edema or posterior oropharyngeal erythema.  Cardiovascular: Normal rate, regular rhythm and normal heart sounds.   Pulmonary/Chest: Breath sounds normal. She has no wheezes. She has no rales.  Lymphadenopathy:    She has no cervical adenopathy.  Neurological: She is alert and oriented to person, place, and time.    BP 156/68 mmHg  Pulse 76  Temp(Src) 97.5 F (36.4 C) (Oral)  Ht  (1.676 m)  Wt 171 lb 6.4 oz (77.747 kg)  BMI 27.68 kg/m2  SpO2 96%  Assessment and Plan: 1. Acute sinusitis, recurrence not specified, unspecified location Continue flonase - methylPREDNISolone (MEDROL DOSEPAK) 4 MG TBPK tablet; Take 6 pills on day 1 the 5 pills day 2 then 4 pills day 3 then 3 pills day 4 then 2 pills day 5 then one pills day 6 then stop  Dispense: 21 tablet; Refill: 0 - amoxicillin-clavulanate (AUGMENTIN) 875-125 MG tablet; Take 1 tablet by mouth 2 (two) times daily.  Dispense: 20 tablet; Refill: 0 - pseudoephedrine (SUDAFED) 30 MG tablet; Take 1 tablet (30 mg total) by mouth every 8 (eight) hours as needed for congestion.  Dispense: 30 tablet; Refill: 0   Bari Edward, MD [facility]  09/01/2015

## 2015-09-23 ENCOUNTER — Other Ambulatory Visit: Payer: Self-pay | Admitting: Internal Medicine

## 2015-09-30 ENCOUNTER — Encounter: Payer: Self-pay | Admitting: Internal Medicine

## 2015-09-30 ENCOUNTER — Ambulatory Visit (INDEPENDENT_AMBULATORY_CARE_PROVIDER_SITE_OTHER): Payer: Medicare Other | Admitting: Internal Medicine

## 2015-09-30 VITALS — BP 114/58 | HR 80 | Ht 66.0 in | Wt 173.1 lb

## 2015-09-30 DIAGNOSIS — J019 Acute sinusitis, unspecified: Secondary | ICD-10-CM

## 2015-09-30 DIAGNOSIS — F32A Depression, unspecified: Secondary | ICD-10-CM

## 2015-09-30 DIAGNOSIS — F419 Anxiety disorder, unspecified: Secondary | ICD-10-CM

## 2015-09-30 DIAGNOSIS — F329 Major depressive disorder, single episode, unspecified: Secondary | ICD-10-CM | POA: Diagnosis not present

## 2015-09-30 DIAGNOSIS — E1100 Type 2 diabetes mellitus with hyperosmolarity without nonketotic hyperglycemic-hyperosmolar coma (NKHHC): Secondary | ICD-10-CM

## 2015-09-30 DIAGNOSIS — R252 Cramp and spasm: Secondary | ICD-10-CM | POA: Diagnosis not present

## 2015-09-30 DIAGNOSIS — E113299 Type 2 diabetes mellitus with mild nonproliferative diabetic retinopathy without macular edema, unspecified eye: Secondary | ICD-10-CM

## 2015-09-30 DIAGNOSIS — E538 Deficiency of other specified B group vitamins: Secondary | ICD-10-CM

## 2015-09-30 DIAGNOSIS — E118 Type 2 diabetes mellitus with unspecified complications: Secondary | ICD-10-CM | POA: Insufficient documentation

## 2015-09-30 HISTORY — DX: Type 2 diabetes mellitus with hyperosmolarity without nonketotic hyperglycemic-hyperosmolar coma (NKHHC): E11.00

## 2015-09-30 MED ORDER — PAROXETINE HCL 30 MG PO TABS: 15.0000 mg | ORAL_TABLET | Freq: Every day | ORAL | Status: DC

## 2015-09-30 MED ORDER — CLORAZEPATE DIPOTASSIUM 3.75 MG PO TABS: 3.7500 mg | ORAL_TABLET | Freq: Two times a day (BID) | ORAL | Status: DC | PRN

## 2015-09-30 MED ORDER — PSEUDOEPHEDRINE HCL 30 MG PO TABS: 30.0000 mg | ORAL_TABLET | Freq: Three times a day (TID) | ORAL | Status: DC | PRN

## 2015-09-30 NOTE — Progress Notes (Signed)
Date:  09/30/2015   Name:  Jordan Montoya   DOB:  09/18/1933   MRN:  [id number]   Chief Complaint: Follow-up and Diabetes Diabetes She presents for her follow-up diabetic visit. She has type 2 diabetes mellitus. Her disease course has been stable. Pertinent negatives for hypoglycemia include no confusion, dizziness or tremors. Pertinent negatives for diabetes include no chest pain and no fatigue. Risk factors for coronary artery disease include diabetes mellitus. She is compliant with treatment most of the time. Her breakfast blood glucose is taken between 6-7 am. Her breakfast blood glucose range is generally 110-130 mg/dl.  Sinus congestion - doing better since taking antibiotics.  Using sudafed as needed - would like a refill. Depression/anxiety - doing well on Paxil.  She continues to use tranxene as needed for anxiety - uses it several times per week.  She has a good appetite and sleep is adequate. Muscle cramps - she has noticed discomfort in her upper leg muscles.  Not related to an increase in activity.  She believes that she has adequate fluid intake.  She has had low potassium in the past.  Also history of low B12 levels.  Lab Results  Component Value Date   HGBA1C 7.9* 05/30/2015     Review of Systems  Constitutional: Negative for fever, chills and fatigue.  HENT: Negative for tinnitus and trouble swallowing.   Respiratory: Negative for cough, chest tightness and shortness of breath.   Cardiovascular: Negative for chest pain, palpitations and leg swelling.  Musculoskeletal: Positive for myalgias. Negative for back pain and gait problem.  Neurological: Negative for dizziness, tremors and syncope.  Psychiatric/Behavioral: Negative for confusion.    Patient Active Problem List   Diagnosis Date Noted  . Type 2 diabetes mellitus (HCC) 09/01/2015  . Environmental and seasonal allergies 05/30/2015  . Hip pain, chronic 01/27/2015  . Breast screening 11/25/2014  .  Clinical depression 11/25/2014  . Avitaminosis D 11/25/2014  . Psoriasis 11/25/2014  . Diabetes mellitus, type 2 (HCC) 11/25/2014  . 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 HLD (hyperlipidemia) 08/20/2011    Prior to Admission medications   Medication Sig Start Date End Date Taking? Authorizing Provider  aspirin 81 MG chewable tablet Chew 1 tablet by mouth daily.   Yes Historical Provider, MD  clorazepate (TRANXENE) 3.75 MG tablet Take 1 tablet (3.75 mg total) by mouth 2 (two) times daily as needed for anxiety. 01/27/15  Yes Reubin Milan, MD  fluticasone (FLONASE) 50 MCG/ACT nasal spray Place 2 sprays into both nostrils daily. 05/30/15  Yes Reubin Milan, MD  glimepiride (AMARYL) 2 MG tablet TAKE 1 TABLET (2 MG TOTAL) BY MOUTH DAILY BEFORE BREAKFAST. 09/23/15  Yes Reubin Milan, MD  meclizine (ANTIVERT) 25 MG tablet Take 1 tablet by mouth 3 (three) times daily as needed. 01/23/14  Yes Historical Provider, MD  PARoxetine (PAXIL) 30 MG tablet Take 0.5 tablets (15 mg total) by mouth daily. 05/01/15  Yes Reubin Milan, MD  pseudoephedrine (SUDAFED) 30 MG tablet Take 1 tablet (30 mg total) by mouth every 8 (eight) hours as needed for congestion. 09/01/15  Yes Reubin Milan, MD  sitaGLIPtin (JANUVIA) 100 MG tablet Take 1 tablet (100 mg total) by mouth daily. 05/01/15  Yes Reubin Milan, MD    Allergies  Allergen Reactions  . Metformin Nausea Only    Past Surgical History  Procedure Laterality Date  . Partial hysterectomy      one ovary remains  .  Eye surgery Bilateral     cataracts  . Abdominal hysterectomy    . Breast biopsy Left     neg-bx/clip    Social History  Substance Use Topics  . Smoking status: Never Smoker   . Smokeless tobacco: None  . Alcohol Use: No     Medication list has been reviewed and updated.   Physical Exam  Constitutional: She is oriented to person, place, and time. She appears well-developed. No distress.  HENT:  Head:  Normocephalic and atraumatic.  Neck: Normal range of motion. Neck supple.  Cardiovascular: Normal rate, regular rhythm and normal heart sounds.   Pulmonary/Chest: Effort normal and breath sounds normal. No respiratory distress.  Musculoskeletal: She exhibits tenderness (proximal muscles both legs). She exhibits no edema.  Lymphadenopathy:    She has no cervical adenopathy.  Neurological: She is alert and oriented to person, place, and time. She has normal strength and normal reflexes. No sensory deficit. Coordination and gait normal.  Skin: Skin is warm and dry. No rash noted.  Psychiatric: She has a normal mood and affect. Her speech is normal and behavior is normal. Thought content normal.  Nursing note and vitals reviewed.   BP 114/58 mmHg  Pulse 80  Ht  (1.676 m)  Wt 173 lb 1.6 oz (78.518 kg)  BMI 27.95 kg/m2  Assessment and Plan: 1. Type 2 diabetes mellitus with hyperosmolarity without coma, without long-term current use of insulin (HCC) Stable on oral agents - Hemoglobin A1c  2. Acute sinusitis, recurrence not specified, unspecified location Improved - continue sudafed as needed - pseudoephedrine (SUDAFED) 30 MG tablet; Take 1 tablet (30 mg total) by mouth every 8 (eight) hours as needed for congestion.  Dispense: 24 tablet; Refill: 5  3. Cyanocobalamine deficiency (non anemic) Supplemented in the past - CBC with Differential/Platelet  4. Muscle cramps Will check electrolytes - Comprehensive metabolic panel - Magnesium  5. Acute anxiety Controlled with intermittent medication - clorazepate (TRANXENE) 3.75 MG tablet; Take 1 tablet (3.75 mg total) by mouth 2 (two) times daily as needed for anxiety.  Dispense: 60 tablet; Refill: 5  6. Clinical depression Doing well on paxil - PARoxetine (PAXIL) 30 MG tablet; Take 0.5 tablets (15 mg total) by mouth daily.  Dispense: 90 tablet; Refill: 1   Bari Edward, MD [facility]  09/30/2015

## 2015-10-01 ENCOUNTER — Telehealth: Payer: Self-pay

## 2015-10-01 LAB — COMPREHENSIVE METABOLIC PANEL
A/G RATIO: 1.6 (ref 1.1–2.5)
ALT: 18 IU/L (ref 0–32)
AST: 19 IU/L (ref 0–40)
Albumin: 4.4 g/dL (ref 3.5–4.7)
Alkaline Phosphatase: 85 IU/L (ref 39–117)
BILIRUBIN TOTAL: 0.2 mg/dL (ref 0.0–1.2)
BUN/Creatinine Ratio: 22 (ref 11–26)
BUN: 17 mg/dL (ref 8–27)
CHLORIDE: 103 mmol/L (ref 96–106)
CO2: 23 mmol/L (ref 18–29)
Calcium: 9.1 mg/dL (ref 8.7–10.3)
Creatinine, Ser: 0.79 mg/dL (ref 0.57–1.00)
GFR calc non Af Amer: 71 mL/min/{1.73_m2} (ref >59)
GFR, EST AFRICAN AMERICAN: 82 mL/min/{1.73_m2} (ref >59)
Globulin, Total: 2.8 g/dL (ref 1.5–4.5)
Glucose: 117 mg/dL — ABNORMAL HIGH (ref 65–99)
POTASSIUM: 4.6 mmol/L (ref 3.5–5.2)
SODIUM: 140 mmol/L (ref 134–144)
Total Protein: 7.2 g/dL (ref 6.0–8.5)

## 2015-10-01 LAB — CBC WITH DIFFERENTIAL/PLATELET
BASOS: 0 %
Basophils Absolute: 0 10*3/uL (ref 0.0–0.2)
EOS (ABSOLUTE): 0.1 10*3/uL (ref 0.0–0.4)
Eos: 3 %
Hematocrit: 34.9 % (ref 34.0–46.6)
Hemoglobin: 11.4 g/dL (ref 11.1–15.9)
Immature Grans (Abs): 0 10*3/uL (ref 0.0–0.1)
Immature Granulocytes: 0 %
LYMPHS ABS: 1.6 10*3/uL (ref 0.7–3.1)
Lymphs: 36 %
MCH: 27.5 pg (ref 26.6–33.0)
MCHC: 32.7 g/dL (ref 31.5–35.7)
MCV: 84 fL (ref 79–97)
MONOS ABS: 0.6 10*3/uL (ref 0.1–0.9)
Monocytes: 13 %
NEUTROS ABS: 2.2 10*3/uL (ref 1.4–7.0)
NEUTROS PCT: 48 %
PLATELETS: 261 10*3/uL (ref 150–379)
RBC: 4.14 x10E6/uL (ref 3.77–5.28)
RDW: 15.6 % — AB (ref 12.3–15.4)
WBC: 4.5 10*3/uL (ref 3.4–10.8)

## 2015-10-01 LAB — HEMOGLOBIN A1C
Est. average glucose Bld gHb Est-mCnc: 148 mg/dL
HEMOGLOBIN A1C: 6.8 % — AB (ref 4.8–5.6)

## 2015-10-01 LAB — MAGNESIUM: Magnesium: 2 mg/dL (ref 1.6–2.3)

## 2015-10-01 NOTE — Telephone Encounter (Signed)
-----   Message from Reubin Milan, MD sent at 10/01/2015  7:51 AM EST ----- DM is much better.  All other labs, electrolytes are normal.

## 2015-10-01 NOTE — Telephone Encounter (Signed)
Spoke with patient. Patient advised of all results and verbalized understanding. Will call back with any future questions or concerns. MAH  

## 2015-10-27 ENCOUNTER — Encounter: Payer: Self-pay | Admitting: Internal Medicine

## 2015-10-27 ENCOUNTER — Ambulatory Visit (INDEPENDENT_AMBULATORY_CARE_PROVIDER_SITE_OTHER): Payer: Medicare Other | Admitting: Internal Medicine

## 2015-10-27 VITALS — BP 142/56 | HR 84 | Temp 98.4°F | Ht 66.0 in | Wt 167.4 lb

## 2015-10-27 DIAGNOSIS — J40 Bronchitis, not specified as acute or chronic: Secondary | ICD-10-CM

## 2015-10-27 MED ORDER — LEVOFLOXACIN 500 MG PO TABS: 500.0000 mg | ORAL_TABLET | Freq: Every day | ORAL | Status: DC

## 2015-10-27 MED ORDER — HYDROCODONE-HOMATROPINE 5-1.5 MG/5ML PO SYRP: 5.0000 mL | ORAL_SOLUTION | Freq: Four times a day (QID) | ORAL | Status: DC | PRN

## 2015-10-27 NOTE — Progress Notes (Signed)
Date:  10/27/2015   Name:  Jordan Montoya   DOB:  01/14/1934   MRN:  [id number]   Chief Complaint: Fatigue and Cough HPI Started to feel sick 6 days ago.   She had a tooth pulled on Tuesday and thought that made her worse.  She then developed a dry cough and fever to 101 for 2 days.  She has not had any diarrhea but she has had some nausea. She took some over the counter cold medication and ibuprofen.  She says she felt so sick that she wasn't even able to go to the urgent care although her family encouraged her. Today she takes it feels a little bit better was able to go out and have lunch with her niece. She still very bothered by the cough productive of yellow phlegm. She has headache and feels weak. She may not be drinking sufficient fluids.  Lab Results  Component Value Date   HGBA1C 6.8* 09/30/2015     Review of Systems  Constitutional: Positive for fever, chills and fatigue. Negative for unexpected weight change.  HENT: Positive for dental problem and sinus pressure. Negative for rhinorrhea, tinnitus and trouble swallowing.   Respiratory: Positive for cough, shortness of breath and wheezing.   Cardiovascular: Negative for chest pain, palpitations and leg swelling.  Neurological: Positive for headaches. Negative for dizziness, seizures and syncope.    Patient Active Problem List   Diagnosis Date Noted  . Type 2 diabetes mellitus with hyperosmolarity without coma, without long-term current use of insulin (HCC) 09/30/2015  . Environmental and seasonal allergies 05/30/2015  . Hip pain, chronic 01/27/2015  . Breast screening 11/25/2014  . Clinical depression 11/25/2014  . Avitaminosis D 11/25/2014  . Psoriasis 11/25/2014  . 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 HLD (hyperlipidemia) 08/20/2011    Prior to Admission medications   Medication Sig Start Date End Date Taking? Authorizing Provider  aspirin 81 MG chewable tablet Chew 1 tablet by mouth daily.     Historical Provider, MD  clorazepate (TRANXENE) 3.75 MG tablet Take 1 tablet (3.75 mg total) by mouth 2 (two) times daily as needed for anxiety. 09/30/15   Reubin Milan, MD  fluticasone (FLONASE) 50 MCG/ACT nasal spray Place 2 sprays into both nostrils daily. 05/30/15   Reubin Milan, MD  glimepiride (AMARYL) 2 MG tablet TAKE 1 TABLET (2 MG TOTAL) BY MOUTH DAILY BEFORE BREAKFAST. 09/23/15   Reubin Milan, MD  meclizine (ANTIVERT) 25 MG tablet Take 1 tablet by mouth 3 (three) times daily as needed. 01/23/14   Historical Provider, MD  PARoxetine (PAXIL) 30 MG tablet Take 0.5 tablets (15 mg total) by mouth daily. 09/30/15   Reubin Milan, MD  pseudoephedrine (SUDAFED) 30 MG tablet Take 1 tablet (30 mg total) by mouth every 8 (eight) hours as needed for congestion. 09/30/15   Reubin Milan, MD  sitaGLIPtin (JANUVIA) 100 MG tablet Take 1 tablet (100 mg total) by mouth daily. 05/01/15   Reubin Milan, MD    Allergies  Allergen Reactions  . Metformin Nausea Only    Past Surgical History  Procedure Laterality Date  . Partial hysterectomy      one ovary remains  . Eye surgery Bilateral     cataracts  . Abdominal hysterectomy    . Breast biopsy Left     neg-bx/clip    Social History  Substance Use Topics  . Smoking status: Never Smoker   . Smokeless tobacco: None  .  Alcohol Use: No    Medication list has been reviewed and updated.   Physical Exam  Constitutional: She appears well-developed and well-nourished. She has a sickly appearance.  HENT:  Right Ear: Tympanic membrane is retracted. Tympanic membrane is not erythematous.  Left Ear: Tympanic membrane is retracted. Tympanic membrane is not erythematous.  Nose: Right sinus exhibits no maxillary sinus tenderness and no frontal sinus tenderness. Left sinus exhibits no maxillary sinus tenderness and no frontal sinus tenderness.  Mouth/Throat: Uvula is midline and oropharynx is clear and moist.  Cardiovascular: Normal  rate, regular rhythm and normal heart sounds.   Pulmonary/Chest: Effort normal. No respiratory distress. She has wheezes.  Abdominal: Soft. Bowel sounds are normal.  Lymphadenopathy:       Head (right side): No submandibular, no preauricular and no posterior auricular adenopathy present.       Head (left side): No submandibular, no preauricular and no posterior auricular adenopathy present.    She has no cervical adenopathy.  Psychiatric: She has a normal mood and affect.    BP 142/56 mmHg  Pulse 84  Temp(Src) 98.4 F (36.9 C) (Oral)  Ht 5\' 6"  (1.676 m)  Wt 167 lb 6.4 oz (75.932 kg)  BMI 27.03 kg/m2  SpO2 95%  Assessment and Plan: 1. Bronchitis Suspect this was Influenza now with bronchitis Increase fluids to 80 oz+ per day Follow up if worsening, fever, n/v, etc - levofloxacin (LEVAQUIN) 500 MG tablet; Take 1 tablet (500 mg total) by mouth daily.  Dispense: 7 tablet; Refill: 0 - HYDROcodone-homatropine (HYCODAN) 5-1.5 MG/5ML syrup; Take 5 mLs by mouth every 6 (six) hours as needed for cough.  Dispense: 120 mL; Refill: 0   Bari EdwardLaura Zain Bingman, MD [facility]  10/27/2015

## 2015-10-27 NOTE — Patient Instructions (Signed)

## 2016-01-29 ENCOUNTER — Encounter: Payer: Self-pay | Admitting: Internal Medicine

## 2016-01-29 ENCOUNTER — Other Ambulatory Visit: Payer: Self-pay | Admitting: Internal Medicine

## 2016-01-29 ENCOUNTER — Ambulatory Visit (INDEPENDENT_AMBULATORY_CARE_PROVIDER_SITE_OTHER): Payer: Medicare Other | Admitting: Internal Medicine

## 2016-01-29 VITALS — BP 120/68 | HR 82 | Resp 16 | Ht 66.0 in | Wt 168.2 lb

## 2016-01-29 DIAGNOSIS — E559 Vitamin D deficiency, unspecified: Secondary | ICD-10-CM

## 2016-01-29 DIAGNOSIS — E538 Deficiency of other specified B group vitamins: Secondary | ICD-10-CM

## 2016-01-29 DIAGNOSIS — L409 Psoriasis, unspecified: Secondary | ICD-10-CM

## 2016-01-29 DIAGNOSIS — E785 Hyperlipidemia, unspecified: Secondary | ICD-10-CM

## 2016-01-29 DIAGNOSIS — E1100 Type 2 diabetes mellitus with hyperosmolarity without nonketotic hyperglycemic-hyperosmolar coma (NKHHC): Secondary | ICD-10-CM | POA: Diagnosis not present

## 2016-01-29 DIAGNOSIS — F324 Major depressive disorder, single episode, in partial remission: Secondary | ICD-10-CM

## 2016-01-29 DIAGNOSIS — H6121 Impacted cerumen, right ear: Secondary | ICD-10-CM | POA: Diagnosis not present

## 2016-01-29 DIAGNOSIS — Z1231 Encounter for screening mammogram for malignant neoplasm of breast: Secondary | ICD-10-CM

## 2016-01-29 DIAGNOSIS — Z0000 Encounter for general adult medical examination without abnormal findings: Secondary | ICD-10-CM | POA: Diagnosis not present

## 2016-01-29 DIAGNOSIS — K644 Residual hemorrhoidal skin tags: Secondary | ICD-10-CM

## 2016-01-29 DIAGNOSIS — K648 Other hemorrhoids: Secondary | ICD-10-CM

## 2016-01-29 LAB — POCT URINALYSIS DIPSTICK
Bilirubin, UA: NEGATIVE
GLUCOSE UA: NEGATIVE
Ketones, UA: NEGATIVE
Leukocytes, UA: NEGATIVE
NITRITE UA: POSITIVE
Protein, UA: NEGATIVE
Spec Grav, UA: 1.025
UROBILINOGEN UA: 0.2
pH, UA: 6

## 2016-01-29 MED ORDER — GLIMEPIRIDE 1 MG PO TABS: 1.0000 mg | ORAL_TABLET | Freq: Every day | ORAL | Status: DC

## 2016-01-29 MED ORDER — ALPRAZOLAM 0.25 MG PO TABS: 0.2500 mg | ORAL_TABLET | Freq: Two times a day (BID) | ORAL | Status: DC | PRN

## 2016-01-29 MED ORDER — HYDROCORTISONE 2.5 % RE CREA: 1.0000 "application " | TOPICAL_CREAM | Freq: Two times a day (BID) | RECTAL | Status: DC

## 2016-01-29 NOTE — Progress Notes (Signed)
Patient: Jordan Montoya, Female    DOB: October 02, 1933, 82 y.o.   MRN: [id number] Visit Date: 01/29/2016  Today's Provider: Bari EdwardLaura Brycelynn Stampley, MD   Chief Complaint  Patient presents with  . Medicare Wellness    Aware may get copay if discussing chronic issues and is ok.   . Diabetes  . Hemorrhoids    Bleeding and has blood in underware after using bathroom.  Constipated.   . Depression    Patient wants to see about different meds for nerves   . Hyperlipidemia   Subjective:    Annual wellness visit Jordan Montoya is a 82 y.o. female who presents today for her Subsequent Annual Wellness Visit. She feels fairly well. She reports exercising none. She reports she is sleeping fairly well.  She denies breast problems.  She does have intermittent rectal bleeding after a bowel movement. ----------------------------------------------------------- Diabetes She presents for her follow-up diabetic visit. She has type 2 diabetes mellitus. Her disease course has been stable. Hypoglycemia symptoms include nervousness/anxiousness and sweats. Pertinent negatives for hypoglycemia include no confusion, dizziness, headaches or tremors. Pertinent negatives for diabetes include no chest pain, no fatigue, no polydipsia and no polyuria. Symptoms are stable. Current diabetic treatment includes oral agent (dual therapy). Her home blood glucose trend is decreasing steadily.  Hyperlipidemia This is a chronic problem. The current episode started more than 1 year ago. The problem is controlled. Recent lipid tests were reviewed and are normal. Pertinent negatives include no chest pain, myalgias or shortness of breath. Current antihyperlipidemic treatment includes statins.  Depression        This is a chronic problem.  The current episode started more than 1 year ago.   The problem has been waxing and waning (since learning that her sister is moving 2.5 hours away) since onset.  Associated symptoms include no  fatigue, no myalgias and no headaches.     The symptoms are aggravated by family issues.  Past treatments include SSRIs - Selective serotonin reuptake inhibitors.  Compliance with treatment is good.  Lab Results  Component Value Date   HGBA1C 6.8* 09/30/2015    Review of Systems  Constitutional: Negative for fever, chills and fatigue.  HENT: Negative for congestion, hearing loss, tinnitus, trouble swallowing and voice change.   Eyes: Negative for visual disturbance.  Respiratory: Negative for cough, chest tightness, shortness of breath and wheezing.   Cardiovascular: Negative for chest pain, palpitations and leg swelling.  Gastrointestinal: Positive for anal bleeding. Negative for vomiting, abdominal pain, diarrhea and constipation.  Endocrine: Negative for polydipsia and polyuria.  Genitourinary: Negative for dysuria, frequency, vaginal bleeding, vaginal discharge and genital sores.  Musculoskeletal: Positive for arthralgias. Negative for myalgias, back pain, joint swelling and gait problem.  Skin: Positive for rash. Negative for color change.  Neurological: Negative for dizziness, tremors, syncope, light-headedness and headaches.  Hematological: Negative for adenopathy. Does not bruise/bleed easily.  Psychiatric/Behavioral: Positive for depression and dysphoric mood. Negative for confusion and sleep disturbance. The patient is nervous/anxious.     Social History   Social History  . Marital Status: Widowed    Spouse Name: N/A  . Number of Children: N/A  . Years of Education: N/A   Occupational History  . Not on file.   Social History Main Topics  . Smoking status: Never Smoker   . Smokeless tobacco: Not on file  . Alcohol Use: No  . Drug Use: No  . Sexual Activity: Not on file   Other Topics Concern  .  Not on file   Social History Narrative    Patient Active Problem List   Diagnosis Date Noted  . Type 2 diabetes mellitus with hyperosmolarity without coma, without  long-term current use of insulin (HCC) 09/30/2015  . Environmental and seasonal allergies 05/30/2015  . Hip pain, chronic 01/27/2015  . Breast screening 11/25/2014  . Depression, major, in partial remission (HCC) 11/25/2014  . Avitaminosis D 11/25/2014  . Psoriasis 11/25/2014  . 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 HLD (hyperlipidemia) 08/20/2011    Past Surgical History  Procedure Laterality Date  . Partial hysterectomy      one ovary remains  . Eye surgery Bilateral     cataracts  . Breast biopsy Left     neg-bx/clip    Her family history includes Cancer in her mother; Diabetes in her son; Heart disease in her father.    Previous Medications   ASPIRIN 81 MG CHEWABLE TABLET    Chew 1 tablet by mouth daily.   COSENTYX SENSOREADY 300 DOSE 150 MG/ML SOAJ    Inject 600 mg into the skin every 30 (thirty) days.   GLIMEPIRIDE (AMARYL) 2 MG TABLET    TAKE 1 TABLET (2 MG TOTAL) BY MOUTH DAILY BEFORE BREAKFAST.   PAROXETINE (PAXIL) 30 MG TABLET    Take 0.5 tablets (15 mg total) by mouth daily.   SITAGLIPTIN (JANUVIA) 100 MG TABLET    Take 1 tablet (100 mg total) by mouth daily.    Patient Care Team: Reubin Milan, MD as PCP - General (Family Medicine)     Objective:   Vitals: BP 120/68 mmHg  Pulse 82  Resp 16  Ht 5\' 6"  (1.676 m)  Wt 168 lb 3.2 oz (76.295 kg)  BMI 27.16 kg/m2  SpO2 96%  Physical Exam  Constitutional: She is oriented to person, place, and time. She appears well-developed and well-nourished. No distress.  HENT:  Head: Normocephalic and atraumatic.  Right Ear: Tympanic membrane and ear canal normal. Decreased hearing is noted.  Left Ear: Tympanic membrane and ear canal normal.  Nose: Right sinus exhibits no maxillary sinus tenderness. Left sinus exhibits no maxillary sinus tenderness.  Mouth/Throat: Uvula is midline and oropharynx is clear and moist.  Impacted cerumen right ear with decreased hearing - removed with currette - hearing  improved, canal clear without lesions  Eyes: Conjunctivae and EOM are normal. Right eye exhibits no discharge. Left eye exhibits no discharge. No scleral icterus.  Neck: Normal range of motion. Carotid bruit is not present. No erythema present. No thyromegaly present.  Cardiovascular: Normal rate, regular rhythm, normal heart sounds and normal pulses.   Pulmonary/Chest: Effort normal. No respiratory distress. She has no wheezes. Right breast exhibits no mass, no nipple discharge, no skin change and no tenderness. Left breast exhibits no mass, no nipple discharge, no skin change and no tenderness.  Abdominal: Soft. Bowel sounds are normal. There is no hepatosplenomegaly. There is no tenderness. There is no CVA tenderness.  Genitourinary: Rectal exam shows external hemorrhoid and tenderness.     Musculoskeletal: Normal range of motion.  Lymphadenopathy:    She has no cervical adenopathy.    She has no axillary adenopathy.  Neurological: She is alert and oriented to person, place, and time. She has normal reflexes. She displays tremor (fine hand tremor with movement; no resting or pill rolling tremor). No cranial nerve deficit or sensory deficit. Coordination and gait normal.  Skin: Skin is warm, dry and intact. No rash noted.  Finger  nail changes c/w psoriasis  Psychiatric: She has a normal mood and affect. Her speech is normal and behavior is normal. Thought content normal.  Nursing note and vitals reviewed.   Activities of Daily Living In your present state of health, do you have any difficulty performing the following activities: 01/29/2016  Hearing? N  Vision? N  Difficulty concentrating or making decisions? N  Walking or climbing stairs? N  Dressing or bathing? N  Doing errands, shopping? N  Preparing Food and eating ? N  Using the Toilet? N  In the past six months, have you accidently leaked urine? Y  Do you have problems with loss of bowel control? N  Managing your Medications? N   Managing your Finances? N  Housekeeping or managing your Housekeeping? N    Fall Risk Assessment Fall Risk  01/29/2016 01/27/2015  Falls in the past year? No No      Depression Screen PHQ 2/9 Scores 01/29/2016 01/27/2015  PHQ - 2 Score 0 0    Cognitive Testing - 6-CIT   Correct? Score   What year is it? yes 0 Yes = 0    No = 4  What month is it? yes 0 Yes = 0    No = 3  Remember:     [place] [address], [address], [place]     What time is it? yes 0 Yes = 0    No = 3  Count backwards from 20 to 1 yes 0 Correct = 0    1 error = 2   More than 1 error = 4  Say the months of the year in reverse. yes 0 Correct = 0    1 error = 2   More than 1 error = 4  What address did I ask you to remember? yes 0 Correct = 0  1 error = 2    2 error = 4    3 error = 6    4 error = 8    All wrong = 10       TOTAL SCORE  0/28   Interpretation:  Normal  Normal (0-7) Abnormal (8-28)        Medicare Annual Wellness Visit Summary:  Reviewed patient's Family Medical History Reviewed and updated list of patient's medical providers Assessment of cognitive impairment was done Assessed patient's functional ability Established a written schedule for health screening services Health Risk Assessent Completed and Reviewed  Exercise Activities and Dietary recommendations Goals    None      Immunization History  Administered Date(s) Administered  . Influenza-Unspecified 05/08/2015  . Pneumococcal Conjugate-13 06/06/2014  . Tdap 08/03/2011    Health Maintenance  Topic Date Due  . ZOSTAVAX  09/29/2020 (Originally 09/26/1995)  . INFLUENZA VACCINE  03/02/2016  . OPHTHALMOLOGY EXAM  03/09/2016  . HEMOGLOBIN A1C  03/29/2016  . FOOT EXAM  01/28/2017  . URINE MICROALBUMIN  01/28/2017  . TETANUS/TDAP  08/02/2021  . DEXA SCAN  Completed  . PNA vac Low Risk Adult  Addressed     Discussed health benefits of physical activity, and encouraged her to engage in regular exercise appropriate for her age and  condition.    ------------------------------------------------------------------------------------------------------------   Assessment & Plan:  1. Medicare annual wellness visit, subsequent Measures satisfied  2. Type 2 diabetes mellitus with hyperosmolarity without coma, without long-term current use of insulin (HCC) Decrease amaryl to 1 mg - Comprehensive metabolic panel - Hemoglobin A1c - Microalbumin / creatinine urine ratio -  POCT urinalysis dipstick - glimepiride (AMARYL) 1 MG tablet; Take 1 tablet (1 mg total) by mouth daily with breakfast.  Dispense: 30 tablet; Refill: 5  3. Avitaminosis D - VITAMIN D 25 Hydroxy (Vit-D Deficiency, Fractures)  4. Cyanocobalamine deficiency (non anemic) - CBC with Differential/Platelet  5. HLD (hyperlipidemia) - Lipid panel  6. Psoriasis Improved on Cosentyx  7. Encounter for screening mammogram for breast cancer - MM DIGITAL SCREENING BILATERAL; Future  8. Major depressive disorder with single episode, in partial remission (HCC) With more anxiety since her sister is moving away - TSH - ALPRAZolam (XANAX) 0.25 MG tablet; Take 1 tablet (0.25 mg total) by mouth 2 (two) times daily as needed for anxiety.  Dispense: 20 tablet; Refill: 1  9. External hemorrhoid, bleeding - hydrocortisone (ANUSOL-HC) 2.5 % rectal cream; Place 1 application rectally 2 (two) times daily.  Dispense: 30 g; Refill: 0  10. Impacted cerumen of right ear Removed without difficulty   Bari EdwardLaura Porshea Janowski, MD [facility]  01/29/2016

## 2016-01-29 NOTE — Patient Instructions (Addendum)
Health Maintenance  Topic Date Due  . ZOSTAVAX  09/29/2020 (Originally 09/26/1995)  . INFLUENZA VACCINE  03/02/2016  . OPHTHALMOLOGY EXAM  03/09/2016  . HEMOGLOBIN A1C  03/29/2016  . FOOT EXAM  01/28/2017  . URINE MICROALBUMIN  01/28/2017  . TETANUS/TDAP  08/02/2021  . DEXA SCAN  Completed  . PNA vac Low Risk Adult  Addressed   Breast Self-Awareness Practicing breast self-awareness may pick up problems early, prevent significant medical complications, and possibly save your life. By practicing breast self-awareness, you can become familiar with how your breasts look and feel and if your breasts are changing. This allows you to notice changes early. It can also offer you some reassurance that your breast health is good. One way to learn what is normal for your breasts and whether your breasts are changing is to do a breast self-exam. If you find a lump or something that was not present in the past, it is best to contact your caregiver right away. Other findings that should be evaluated by your caregiver include nipple discharge, especially if it is bloody; skin changes or reddening; areas where the skin seems to be pulled in (retracted); or new lumps and bumps. Breast pain is seldom associated with cancer (malignancy), but should also be evaluated by a caregiver. HOW TO PERFORM A BREAST SELF-EXAM The best time to examine your breasts is 5-7 days after your menstrual period is over. During menstruation, the breasts are lumpier, and it may be more difficult to pick up changes. If you do not menstruate, have reached menopause, or had your uterus removed (hysterectomy), you should examine your breasts at regular intervals, such as monthly. If you are breastfeeding, examine your breasts after a feeding or after using a breast pump. Breast implants do not decrease the risk for lumps or tumors, so continue to perform breast self-exams as recommended. Talk to your caregiver about how to determine the  difference between the implant and breast tissue. Also, talk about the amount of pressure you should use during the exam. Over time, you will become more familiar with the variations of your breasts and more comfortable with the exam. A breast self-exam requires you to remove all your clothes above the waist. 1. Look at your breasts and nipples. Stand in front of a mirror in a room with good lighting. With your hands on your hips, push your hands firmly downward. Look for a difference in shape, contour, and size from one breast to the other (asymmetry). Asymmetry includes puckers, dips, or bumps. Also, look for skin changes, such as reddened or scaly areas on the breasts. Look for nipple changes, such as discharge, dimpling, repositioning, or redness. 2. Carefully feel your breasts. This is best done either in the shower or tub while using soapy water or when flat on your back. Place the arm (on the side of the breast you are examining) above your head. Use the pads (not the fingertips) of your three middle fingers on your opposite hand to feel your breasts. Start in the underarm area and use  inch (2 cm) overlapping circles to feel your breast. Use 3 different levels of pressure (light, medium, and firm pressure) at each circle before moving to the next circle. The light pressure is needed to feel the tissue closest to the skin. The medium pressure will help to feel breast tissue a little deeper, while the firm pressure is needed to feel the tissue close to the ribs. Continue the overlapping circles, moving downward  over the breast until you feel your ribs below your breast. Then, move one finger-width towards the center of the body. Continue to use the  inch (2 cm) overlapping circles to feel your breast as you move slowly up toward the collar bone (clavicle) near the base of the neck. Continue the up and down exam using all 3 pressures until you reach the middle of the chest. Do this with each breast,  carefully feeling for lumps or changes. 3.  Keep a written record with breast changes or normal findings for each breast. By writing this information down, you do not need to depend only on memory for size, tenderness, or location. Write down where you are in your menstrual cycle, if you are still menstruating. Breast tissue can have some lumps or thick tissue. However, see your caregiver if you find anything that concerns you.  SEEK MEDICAL CARE IF:  You see a change in shape, contour, or size of your breasts or nipples.   You see skin changes, such as reddened or scaly areas on the breasts or nipples.   You have an unusual discharge from your nipples.   You feel a new lump or unusually thick areas.    This information is not intended to replace advice given to you by your health care provider. Make sure you discuss any questions you have with your health care provider.   Document Released: 07/19/2005 Document Revised: 07/05/2012 Document Reviewed: 11/03/2011 Elsevier Interactive Patient Education [employer]

## 2016-01-30 LAB — MICROALBUMIN / CREATININE URINE RATIO
Creatinine, Urine: 198.9 mg/dL
MICROALB/CREAT RATIO: 8.2 mg/g{creat} (ref 0.0–30.0)
MICROALBUM., U, RANDOM: 16.3 ug/mL

## 2016-02-03 LAB — LIPID PANEL
Chol/HDL Ratio: 3.3 ratio units (ref 0.0–4.4)
Cholesterol, Total: 178 mg/dL (ref 100–199)
HDL: 54 mg/dL (ref >39)
LDL Calculated: 91 mg/dL (ref 0–99)
Triglycerides: 167 mg/dL — ABNORMAL HIGH (ref 0–149)
VLDL Cholesterol Cal: 33 mg/dL (ref 5–40)

## 2016-02-03 LAB — CBC WITH DIFFERENTIAL/PLATELET
BASOS ABS: 0 10*3/uL (ref 0.0–0.2)
Basos: 1 %
EOS (ABSOLUTE): 0.1 10*3/uL (ref 0.0–0.4)
Eos: 2 %
HEMOGLOBIN: 11.4 g/dL (ref 11.1–15.9)
Hematocrit: 35.1 % (ref 34.0–46.6)
Immature Grans (Abs): 0 10*3/uL (ref 0.0–0.1)
Immature Granulocytes: 0 %
LYMPHS ABS: 1.6 10*3/uL (ref 0.7–3.1)
Lymphs: 33 %
MCH: 27.8 pg (ref 26.6–33.0)
MCHC: 32.5 g/dL (ref 31.5–35.7)
MCV: 86 fL (ref 79–97)
MONOCYTES: 8 %
MONOS ABS: 0.4 10*3/uL (ref 0.1–0.9)
NEUTROS ABS: 2.7 10*3/uL (ref 1.4–7.0)
Neutrophils: 56 %
Platelets: 240 10*3/uL (ref 150–379)
RBC: 4.1 x10E6/uL (ref 3.77–5.28)
RDW: 15.2 % (ref 12.3–15.4)
WBC: 4.9 10*3/uL (ref 3.4–10.8)

## 2016-02-03 LAB — COMPREHENSIVE METABOLIC PANEL
ALBUMIN: 4 g/dL (ref 3.5–4.7)
ALK PHOS: 86 IU/L (ref 39–117)
ALT: 16 IU/L (ref 0–32)
AST: 18 IU/L (ref 0–40)
Albumin/Globulin Ratio: 1.3 (ref 1.2–2.2)
BILIRUBIN TOTAL: 0.2 mg/dL (ref 0.0–1.2)
BUN / CREAT RATIO: 20 (ref 12–28)
BUN: 17 mg/dL (ref 8–27)
CHLORIDE: 104 mmol/L (ref 96–106)
CO2: 21 mmol/L (ref 18–29)
Calcium: 9.1 mg/dL (ref 8.7–10.3)
Creatinine, Ser: 0.87 mg/dL (ref 0.57–1.00)
GFR calc Af Amer: 73 mL/min/{1.73_m2} (ref >59)
GFR calc non Af Amer: 63 mL/min/{1.73_m2} (ref >59)
GLUCOSE: 144 mg/dL — AB (ref 65–99)
Globulin, Total: 3 g/dL (ref 1.5–4.5)
Potassium: 4.7 mmol/L (ref 3.5–5.2)
SODIUM: 142 mmol/L (ref 134–144)
Total Protein: 7 g/dL (ref 6.0–8.5)

## 2016-02-03 LAB — VITAMIN D 25 HYDROXY (VIT D DEFICIENCY, FRACTURES): VIT D 25 HYDROXY: 19 ng/mL — AB (ref 30.0–100.0)

## 2016-02-03 LAB — TSH: TSH: 2.45 u[IU]/mL (ref 0.450–4.500)

## 2016-02-03 LAB — HEMOGLOBIN A1C
Est. average glucose Bld gHb Est-mCnc: 140 mg/dL
HEMOGLOBIN A1C: 6.5 % — AB (ref 4.8–5.6)

## 2016-02-18 ENCOUNTER — Ambulatory Visit
Admission: RE | Admit: 2016-02-18 | Discharge: 2016-02-18 | Disposition: A | Discharge status: Home / Self Care | Payer: Medicare Other | Source: Ambulatory Visit | Attending: Internal Medicine | Admitting: Internal Medicine

## 2016-02-18 DIAGNOSIS — Z1231 Encounter for screening mammogram for malignant neoplasm of breast: Secondary | ICD-10-CM

## 2016-05-20 ENCOUNTER — Other Ambulatory Visit: Payer: Self-pay | Admitting: Internal Medicine

## 2016-05-20 DIAGNOSIS — F329 Major depressive disorder, single episode, unspecified: Secondary | ICD-10-CM

## 2016-05-20 DIAGNOSIS — F32A Depression, unspecified: Secondary | ICD-10-CM

## 2016-05-23 ENCOUNTER — Other Ambulatory Visit: Payer: Self-pay | Admitting: Internal Medicine

## 2016-05-31 ENCOUNTER — Encounter: Payer: Self-pay | Admitting: Internal Medicine

## 2016-05-31 ENCOUNTER — Ambulatory Visit (INDEPENDENT_AMBULATORY_CARE_PROVIDER_SITE_OTHER): Payer: Medicare Other | Admitting: Internal Medicine

## 2016-05-31 VITALS — BP 140/68 | HR 84 | Resp 16 | Ht 66.0 in | Wt 166.4 lb

## 2016-05-31 DIAGNOSIS — F324 Major depressive disorder, single episode, in partial remission: Secondary | ICD-10-CM

## 2016-05-31 DIAGNOSIS — E559 Vitamin D deficiency, unspecified: Secondary | ICD-10-CM

## 2016-05-31 DIAGNOSIS — E1100 Type 2 diabetes mellitus with hyperosmolarity without nonketotic hyperglycemic-hyperosmolar coma (NKHHC): Secondary | ICD-10-CM | POA: Diagnosis not present

## 2016-05-31 DIAGNOSIS — B373 Candidiasis of vulva and vagina: Secondary | ICD-10-CM

## 2016-05-31 DIAGNOSIS — B3731 Acute candidiasis of vulva and vagina: Secondary | ICD-10-CM

## 2016-05-31 MED ORDER — FLUCONAZOLE 100 MG PO TABS: 100.0000 mg | ORAL_TABLET | ORAL | 0 refills | Status: DC

## 2016-05-31 NOTE — Progress Notes (Signed)
Date:  05/31/2016   Name:  Jordan Montoya   DOB:  [birth year]   MRN:  [id number]   Chief Complaint: Diabetes (BS 100-130) Diabetes  She presents for her follow-up diabetic visit. She has type 2 diabetes mellitus. Her disease course has been stable. Hypoglycemia symptoms include nervousness/anxiousness (about terminally ill cat). Pertinent negatives for hypoglycemia include no headaches or tremors. Pertinent negatives for diabetes include no chest pain, no fatigue, no foot paresthesias, no polydipsia, no polyphagia, no polyuria and no visual change. Symptoms are stable. She monitors blood glucose at home 1-2 x per week. There is no change in her home blood glucose trend.   Lab Results  Component Value Date   HGBA1C 6.5 (H) 02/02/2016      Review of Systems  Constitutional: Negative for appetite change, fatigue, fever and unexpected weight change.  HENT: Negative for tinnitus and trouble swallowing.   Eyes: Negative for visual disturbance.  Respiratory: Negative for cough, chest tightness and shortness of breath.   Cardiovascular: Negative for chest pain, palpitations and leg swelling.  Gastrointestinal: Negative for abdominal pain.  Endocrine: Negative for polydipsia, polyphagia and polyuria.  Genitourinary: Positive for genital sores (vaginal itching). Negative for dysuria and hematuria.  Musculoskeletal: Positive for arthralgias.  Skin: Positive for rash.  Neurological: Negative for tremors, numbness and headaches.  Psychiatric/Behavioral: Negative for dysphoric mood. The patient is nervous/anxious (about terminally ill cat).     Patient Active Problem List   Diagnosis Date Noted  . Type 2 diabetes mellitus with hyperosmolarity without coma, without long-term current use of insulin (HCC) 09/30/2015  . Environmental and seasonal allergies 05/30/2015  . Hip pain, chronic 01/27/2015  . 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  Avitaminosis D 11/25/2014  . Psoriasis 11/25/2014  . 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 HLD (hyperlipidemia) 08/20/2011    Prior to Admission medications   Medication Sig Start Date End Date Taking? Authorizing Provider  ALPRAZolam (XANAX) 0.25 MG tablet Take 1 tablet (0.25 mg total) by mouth 2 (two) times daily as needed for anxiety. 01/29/16  Yes Reubin MilanLaura H Harrington Jobe, MD  aspirin 81 MG chewable tablet Chew 1 tablet by mouth daily.   Yes Historical Provider, MD  COSENTYX SENSOREADY 300 DOSE 150 MG/ML SOAJ Inject 600 mg into the skin every 30 (thirty) days. 01/26/16  Yes Historical Provider, MD  glimepiride (AMARYL) 1 MG tablet Take 1 tablet (1 mg total) by mouth daily with breakfast. 01/29/16  Yes Reubin MilanLaura H Meera Vasco, MD  hydrocortisone (ANUSOL-HC) 2.5 % rectal cream Place 1 application rectally 2 (two) times daily. 01/29/16  Yes Reubin MilanLaura H Tahari Clabaugh, MD  JANUVIA 100 MG tablet TAKE 1 TABLET (100 MG TOTAL) BY MOUTH DAILY. 05/23/16  Yes Reubin MilanLaura H Nefertari Rebman, MD  PARoxetine (PAXIL) 30 MG tablet TAKE 1/2 TABLET BY MOUTH EVERY DAY 05/20/16  Yes Reubin MilanLaura H Marketta Valadez, MD    Allergies  Allergen Reactions  . Metformin Nausea Only    Past Surgical History:  Procedure Laterality Date  . ABDOMINAL HYSTERECTOMY    . BREAST BIOPSY Left    neg-bx/clip  . BREAST CYST EXCISION Left    neg  . EYE SURGERY Bilateral    cataracts  . PARTIAL HYSTERECTOMY     one ovary remains    Social History  Substance Use Topics  . Smoking status: Never Smoker  . Smokeless tobacco: Never Used  . Alcohol use No     Medication list has been reviewed  and updated.   Physical Exam  Constitutional: She is oriented to person, place, and time. She appears well-developed. No distress.  HENT:  Head: Normocephalic and atraumatic.  Cardiovascular: Normal rate, regular rhythm and normal heart sounds.   Pulmonary/Chest: Effort normal and breath sounds normal. No respiratory distress.  Musculoskeletal: Normal range of  motion.  Neurological: She is alert and oriented to person, place, and time.  Skin: Skin is warm and dry. No rash noted.  Psychiatric: She has a normal mood and affect. Her behavior is normal. Thought content normal.  Nursing note and vitals reviewed.   BP 140/68   Pulse 84   Resp 16   Ht 5\' 6"  (1.676 m)   Wt 166 lb 6.4 oz (75.5 kg)   SpO2 100%   BMI 26.86 kg/m   Assessment and Plan: 1. Type 2 diabetes mellitus with hyperosmolarity without coma, without long-term current use of insulin (HCC) Controlled with less hypoglycemic episodes - Hemoglobin A1c  2. Major depressive disorder with single episode, in partial remission (HCC) Doing well  3. Avitaminosis D  now on supplement  4. Yeast vaginitis diflucan   Jordan EdwardLaura Norberto Wishon, MD [facility]  05/31/2016

## 2016-05-31 NOTE — Patient Instructions (Signed)
Diabetic Retinopathy Diabetic retinopathy is a disease of the light-sensitive membrane at the back of the eye (retina). It is a complication of diabetes and a common cause of blindness. Early detection of the disease is key to keeping your eyes healthy.  CAUSES  Diabetic retinopathy is caused by blood sugar (glucose) levels that are too high over an extended period of time. High blood sugars cause damage to the small blood vessels of the retina, allowing blood to leak through the vessel walls. This causes visual impairment and eventually blindness. RISK FACTORS  High blood pressure.  Having diabetes for a long time.  Having poorly controlled blood sugars. SIGNS AND SYMPTOMS  In the early stages of diabetic retinopathy, there are often no symptoms. As the condition advances, symptoms may include:  Blurred vision. This is usually caused by a swelling due to abnormal blood glucose levels. The blurriness may go away when blood glucose levels return to normal.  Moving specks or dark spots (floaters) in your vision. These can be caused by a small retinal hemorrhage. A hemorrhage is bleeding from blood vessels.  Missing parts of your field of vision, such as things at the side. This can be caused by larger retinal hemorrhages.  Difficulty reading books or signs.  Double vision.  Pain in one or both eyes.  Feeling pressure in one or both eyes.  Trouble seeing straight lines. Straight lines do not look straight.  Redness of the eyes that does not go away. DIAGNOSIS  Your eye care specialist can detect changes in the blood vessels of your eye by putting drops in your eyes that enlarge (dilate) your pupils. This allows your eye care specialist to get a good look at your retina to see if there are any changes that have occurred as a result of your diabetes. You should have your eyes examined once a year. TREATMENT  Your eye care specialist may use a special laser beam to seal the blood vessels  of the retina and stop them from leaking. Early detection and treatment are important so that further damage to your eyes can be prevented. In addition, managing your blood sugars and keeping them in the target range can slow the progress of the disease. HOME CARE INSTRUCTIONS   Keep your blood pressure within your target range.  Keep your blood glucose levels within your target range.  Follow your health care provider's instructions regarding diet and other means for controlling your blood glucose levels.  Check your blood levels for glucose as recommended by your health care provider.  Keep regular appointments with your eye specialist. An eye specialist can usually see diabetic retinopathy developing long before it starts causing problems. In many cases, it can be treated to prevent complications from occurring. If you have diabetes, you should have your eyes checked at least every year. Your risk of retinopathy increases the longer you have the disease.  If you smoke, quit. Ask your health care provider for help if needed. Smoking can make retinopathy worse. SEEK MEDICAL CARE IF:   You notice gradual blurring or other changes in your vision over time.  You notice that your glasses or contact lenses do not make things look as sharp as they once did.  You have trouble reading or seeing details at a distance with either eye.  You notice a sudden change in your vision or notice that parts of your field of vision appear missing or hazy.  You suddenly see moving specks or dark spots   in the field of vision of either eye.  You have sudden partial loss of vision in either eye.   This information is not intended to replace advice given to you by your health care provider. Make sure you discuss any questions you have with your health care provider.   Document Released: 07/16/2000 Document Revised: 05/09/2013 Document Reviewed: 01/08/2013 Elsevier Interactive Patient Education 2016 Elsevier  Inc.  

## 2016-06-01 LAB — HEMOGLOBIN A1C
ESTIMATED AVERAGE GLUCOSE: 143 mg/dL
HEMOGLOBIN A1C: 6.6 % — AB (ref 4.8–5.6)

## 2016-07-22 ENCOUNTER — Other Ambulatory Visit: Payer: Self-pay | Admitting: Internal Medicine

## 2016-07-22 DIAGNOSIS — E1100 Type 2 diabetes mellitus with hyperosmolarity without nonketotic hyperglycemic-hyperosmolar coma (NKHHC): Secondary | ICD-10-CM

## 2016-09-29 ENCOUNTER — Encounter: Payer: Self-pay | Admitting: Internal Medicine

## 2016-09-29 ENCOUNTER — Ambulatory Visit (INDEPENDENT_AMBULATORY_CARE_PROVIDER_SITE_OTHER): Payer: Medicare Other | Admitting: Internal Medicine

## 2016-09-29 VITALS — BP 136/82 | HR 86 | Ht 66.0 in | Wt 170.0 lb

## 2016-09-29 DIAGNOSIS — F324 Major depressive disorder, single episode, in partial remission: Secondary | ICD-10-CM

## 2016-09-29 DIAGNOSIS — G8929 Other chronic pain: Secondary | ICD-10-CM

## 2016-09-29 DIAGNOSIS — L409 Psoriasis, unspecified: Secondary | ICD-10-CM

## 2016-09-29 DIAGNOSIS — M25551 Pain in right hip: Secondary | ICD-10-CM

## 2016-09-29 DIAGNOSIS — E1100 Type 2 diabetes mellitus with hyperosmolarity without nonketotic hyperglycemic-hyperosmolar coma (NKHHC): Secondary | ICD-10-CM

## 2016-09-29 DIAGNOSIS — N761 Subacute and chronic vaginitis: Secondary | ICD-10-CM | POA: Diagnosis not present

## 2016-09-29 LAB — POCT WET PREP WITH KOH
CLUE CELLS WET PREP PER HPF POC: NEGATIVE
KOH Prep POC: NEGATIVE
RBC Wet Prep HPF POC: 2
TRICHOMONAS UA: NEGATIVE
WBC Wet Prep HPF POC: 0

## 2016-09-29 MED ORDER — ESTRADIOL 0.1 MG/GM VA CREA: 1.0000 | TOPICAL_CREAM | Freq: Every day | VAGINAL | 12 refills | Status: DC

## 2016-09-29 NOTE — Progress Notes (Signed)
Date:  09/29/2016   Name:  Jordan SpittleGretchen Johns Montoya   DOB:  05-10-1934   MRN:  [id number]   Chief Complaint: Diabetes (143 this morning.) and Urinary Tract Infection (Itching/ burning for a couple months. Not getting better.) Diabetes  She presents for her follow-up diabetic visit. She has type 2 diabetes mellitus. Her disease course has been stable. Hypoglycemia symptoms include dizziness. Pertinent negatives for hypoglycemia include no headaches or tremors. Pertinent negatives for diabetes include no chest pain, no fatigue, no polydipsia and no polyuria. Symptoms are stable. Current diabetic treatment includes oral agent (dual therapy). She is following a generally healthy diet. Her breakfast blood glucose is taken between 6-7 am. Her breakfast blood glucose range is generally 110-130 mg/dl. (Highest 200 and lowest 80)  Urinary Tract Infection   This is a chronic (external burning with urination) problem. Pertinent negatives include no chills, discharge or hematuria. Treatments tried: treated in the past with diflucan with no benefit.  Vaginal irritation - as above.  No itching or discharge.  No bleeding noted.  Mostly discomfort after urination and with hygiene. Hip pain - chronic hip pain limiting distance ambulation.  She remains on medications for psoriasis and may have psoriatic arthritis.  She requests handicapped parking placard.   Review of Systems  Constitutional: Negative for appetite change, chills, fatigue, fever and unexpected weight change.  HENT: Negative for tinnitus and trouble swallowing.   Eyes: Negative for visual disturbance.  Respiratory: Negative for cough, chest tightness and shortness of breath.   Cardiovascular: Negative for chest pain, palpitations and leg swelling.  Gastrointestinal: Negative for abdominal pain.  Endocrine: Negative for polydipsia and polyuria.  Genitourinary: Positive for genital sores. Negative for difficulty urinating, dysuria and hematuria.    Musculoskeletal: Positive for arthralgias and myalgias.  Skin: Negative for rash.  Neurological: Positive for dizziness. Negative for tremors, light-headedness, numbness and headaches.  Psychiatric/Behavioral: Negative for dysphoric mood and sleep disturbance.    Patient Active Problem List   Diagnosis Date Noted  . Type 2 diabetes mellitus with hyperosmolarity without coma, without long-term current use of insulin (HCC) 09/30/2015  . Environmental and seasonal allergies 05/30/2015  . Hip pain, chronic 01/27/2015  . 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 Avitaminosis D 11/25/2014  . Psoriasis 11/25/2014  . 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 HLD (hyperlipidemia) 08/20/2011    Prior to Admission medications   Medication Sig Start Date End Date Taking? Authorizing Provider  aspirin 81 MG chewable tablet Chew 1 tablet by mouth daily.   Yes Historical Provider, MD  COSENTYX SENSOREADY 300 DOSE 150 MG/ML SOAJ Inject 600 mg into the skin every 30 (thirty) days. 01/26/16  Yes Historical Provider, MD  Ergocalciferol (VITAMIN D2) 2000 units TABS Take 1 tablet by mouth daily.   Yes Historical Provider, MD  glimepiride (AMARYL) 1 MG tablet TAKE 1 TABLET (1 MG TOTAL) BY MOUTH DAILY WITH BREAKFAST. 07/22/16  Yes Reubin MilanLaura H Fidencio Duddy, MD  hydrocortisone (ANUSOL-HC) 2.5 % rectal cream Place 1 application rectally 2 (two) times daily. 01/29/16  Yes Reubin MilanLaura H Azaliyah Kennard, MD  hydrOXYzine (ATARAX/VISTARIL) 10 MG tablet Take 10 mg by mouth daily. 09/20/16  Yes Historical Provider, MD  JANUVIA 100 MG tablet TAKE 1 TABLET (100 MG TOTAL) BY MOUTH DAILY. 05/23/16  Yes Reubin MilanLaura H Saraphina Lauderbaugh, MD  PARoxetine (PAXIL) 30 MG tablet TAKE 1/2 TABLET BY MOUTH EVERY DAY 05/20/16  Yes Reubin MilanLaura H Floria Brandau, MD  ALPRAZolam Prudy Feeler(XANAX) 0.25 MG tablet  Take 1 tablet (0.25 mg total) by mouth 2 (two) times daily as needed for anxiety. Patient not taking: Reported on 09/29/2016 01/29/16    Reubin Milan, MD    Allergies  Allergen Reactions  . Metformin Nausea Only    Past Surgical History:  Procedure Laterality Date  . ABDOMINAL HYSTERECTOMY    . BREAST BIOPSY Left    neg-bx/clip  . BREAST CYST EXCISION Left    neg  . EYE SURGERY Bilateral    cataracts  . PARTIAL HYSTERECTOMY     one ovary remains    Social History  Substance Use Topics  . Smoking status: Never Smoker  . Smokeless tobacco: Never Used  . Alcohol use No     Medication list has been reviewed and updated.   Physical Exam  Constitutional: She is oriented to person, place, and time. She appears well-developed. No distress.  HENT:  Head: Normocephalic and atraumatic.  Neck: Normal range of motion. Neck supple.  Cardiovascular: Normal rate, regular rhythm and normal heart sounds.   Pulmonary/Chest: Effort normal and breath sounds normal. No respiratory distress. She has no wheezes.  Abdominal: Soft. Normal appearance.  Genitourinary: Vagina normal.    There is tenderness on the right labia. There is tenderness on the left labia. No vaginal discharge found.  Genitourinary Comments: Thin mucosa, shallow ulceration anterior left - no bleeding.  Tender  Musculoskeletal: She exhibits no edema.  Neurological: She is alert and oriented to person, place, and time.  Skin: Skin is warm and dry. No rash noted.  Psychiatric: Her behavior is normal. Thought content normal. Her mood appears anxious.  Nursing note and vitals reviewed.  Microscopic wet-mount exam shows negative for pathogens, normal epithelial cells.  BP 136/82   Pulse 86   Ht 5\' 6"  (1.676 m)   Wt 170 lb (77.1 kg)   SpO2 96%   BMI 27.44 kg/m   Assessment and Plan: 1. Type 2 diabetes mellitus with hyperosmolarity without coma, without long-term current use of insulin (HCC) Continue oral agents, healthy diet - Hemoglobin A1c  2. Psoriasis Continue treatment by specialist  3. Major depressive disorder with single episode,  in partial remission (HCC) stable  4. Subacute vaginitis Estrogen cream nightly  5. Chronic pain of right hip Handicapped parking application given  Meds ordered this encounter  Medications  . estradiol (ESTRACE) 0.1 MG/GM vaginal cream    Sig: Place 1 Applicatorful vaginally at bedtime. Pea sized amount to internal labia nightly    Dispense:  42.5 g    Refill:  12     Bari Edward, MD [facility]  09/29/2016

## 2016-09-30 LAB — HEMOGLOBIN A1C
Est. average glucose Bld gHb Est-mCnc: 137 mg/dL
Hgb A1c MFr Bld: 6.4 % — ABNORMAL HIGH (ref 4.8–5.6)

## 2016-10-12 ENCOUNTER — Telehealth: Payer: Self-pay

## 2016-10-12 NOTE — Telephone Encounter (Signed)
error 

## 2016-10-13 ENCOUNTER — Encounter: Payer: Self-pay | Admitting: Emergency Medicine

## 2016-10-13 ENCOUNTER — Ambulatory Visit
Admission: EM | Admit: 2016-10-13 | Discharge: 2016-10-13 | Disposition: A | Discharge status: Home / Self Care | Payer: Medicare Other | Attending: Family Medicine | Admitting: Family Medicine

## 2016-10-13 ENCOUNTER — Ambulatory Visit: Payer: Medicare Other | Admitting: Internal Medicine

## 2016-10-13 DIAGNOSIS — J0120 Acute ethmoidal sinusitis, unspecified: Secondary | ICD-10-CM | POA: Diagnosis not present

## 2016-10-13 MED ORDER — HYDROCODONE-HOMATROPINE 5-1.5 MG/5ML PO SYRP: 5.0000 mL | ORAL_SOLUTION | Freq: Four times a day (QID) | ORAL | 0 refills | Status: DC | PRN

## 2016-10-13 MED ORDER — AMOXICILLIN-POT CLAVULANATE 875-125 MG PO TABS: 1.0000 | ORAL_TABLET | Freq: Two times a day (BID) | ORAL | 0 refills | Status: AC

## 2016-10-13 NOTE — ED Triage Notes (Signed)
Patient c/o sinus congestion and pressure, and nasal congestion and HAs that started a week ago.

## 2016-10-13 NOTE — ED Provider Notes (Signed)
CSN: [id number]     Arrival date & time 10/13/16  4098 History   First MD Initiated Contact with Patient 10/13/16 0935     Chief Complaint  Patient presents with  . Sinus Problem   (Consider location/radiation/quality/duration/timing/severity/associated sxs/prior Treatment) 83 year old female presents with sinus pressure, nasal congestion, headache, hoarse voice and cough for over 1 week. Denies any fever, sore throat or diarrhea. She has been nauseous and did vomit once this morning due to mucus. She has been taking Sudafed, Advil and Rx cough medication with codeine (left over from previous illness) with some relief but has run out of her cough medication. She has a history of diabetes and blood glucose levels have been elevated (around 240-250 today due to illness).     The history is provided by the patient.    History reviewed. No pertinent past medical history. Past Surgical History:  Procedure Laterality Date  . ABDOMINAL HYSTERECTOMY    . BREAST BIOPSY Left    neg-bx/clip  . BREAST CYST EXCISION Left    neg  . EYE SURGERY Bilateral    cataracts  . PARTIAL HYSTERECTOMY     one ovary remains   Family History  Problem Relation Age of Onset  . Cancer Mother   . Heart disease Father   . Diabetes Son   . Breast cancer Neg Hx    Social History  Substance Use Topics  . Smoking status: Never Smoker  . Smokeless tobacco: Never Used  . Alcohol use No   OB History    No data available     Review of Systems  Constitutional: Positive for appetite change and fatigue. Negative for chills and fever.  HENT: Positive for congestion, ear pain (ear fullness bilaterally), postnasal drip, rhinorrhea, sinus pressure and voice change. Negative for ear discharge, facial swelling, nosebleeds, sinus pain, sneezing and sore throat.   Eyes: Negative for discharge.  Respiratory: Positive for cough. Negative for chest tightness, shortness of breath and wheezing.   Cardiovascular: Negative  for chest pain.  Gastrointestinal: Positive for nausea and vomiting. Negative for abdominal pain and diarrhea.  Musculoskeletal: Negative for arthralgias, back pain, myalgias, neck pain and neck stiffness.  Skin: Negative for rash and wound.  Neurological: Positive for light-headedness and headaches. Negative for dizziness, syncope, weakness and numbness.  Hematological: Negative for adenopathy.    Allergies  Metformin  Home Medications   Prior to Admission medications   Medication Sig Start Date End Date Taking? Authorizing Provider  amoxicillin-clavulanate (AUGMENTIN) 875-125 MG tablet Take 1 tablet by mouth every 12 (twelve) hours. 10/13/16 10/20/16  Sudie Grumbling, NP  aspirin 81 MG chewable tablet Chew 1 tablet by mouth daily.    Historical Provider, MD  COSENTYX SENSOREADY 300 DOSE 150 MG/ML SOAJ Inject 600 mg into the skin every 30 (thirty) days. 01/26/16   Historical Provider, MD  Ergocalciferol (VITAMIN D2) 2000 units TABS Take 1 tablet by mouth daily.    Historical Provider, MD  estradiol (ESTRACE) 0.1 MG/GM vaginal cream Place 1 Applicatorful vaginally at bedtime. Pea sized amount to internal labia nightly 09/29/16   Reubin Milan, MD  glimepiride (AMARYL) 1 MG tablet TAKE 1 TABLET (1 MG TOTAL) BY MOUTH DAILY WITH BREAKFAST. 07/22/16   Reubin Milan, MD  HYDROcodone-homatropine [facility]) 5-1.5 MG/5ML syrup Take 5 mLs by mouth every 6 (six) hours as needed for cough. 10/13/16   Sudie Grumbling, NP  hydrocortisone (ANUSOL-HC) 2.5 % rectal cream Place 1 application rectally 2 (  two) times daily. 01/29/16   Reubin MilanLaura H Berglund, MD  hydrOXYzine (ATARAX/VISTARIL) 10 MG tablet Take 10 mg by mouth daily. 09/20/16   Historical Provider, MD  JANUVIA 100 MG tablet TAKE 1 TABLET (100 MG TOTAL) BY MOUTH DAILY. 05/23/16   Reubin MilanLaura H Berglund, MD  PARoxetine (PAXIL) 30 MG tablet TAKE 1/2 TABLET BY MOUTH EVERY DAY 05/20/16   Reubin MilanLaura H Berglund, MD   Meds Ordered and Administered this Visit  Medications  - No data to display  BP (!) 156/62 (BP Location: Left Arm)   Pulse 93   Temp 98.2 F (36.8 C) (Oral)   Resp 16   Ht 5\' 6"  (1.676 m)   Wt 168 lb (76.2 kg)   SpO2 96%   BMI 27.12 kg/m  No data found.   Physical Exam  Constitutional: She is oriented to person, place, and time. She appears well-developed and well-nourished. No distress.  HENT:  Head: Normocephalic and atraumatic.  Right Ear: Hearing, external ear and ear canal normal. Tympanic membrane is bulging. Tympanic membrane is not injected and not erythematous.  Left Ear: Hearing, external ear and ear canal normal. Tympanic membrane is bulging. Tympanic membrane is not injected and not erythematous.  Nose: Rhinorrhea present. Right sinus exhibits maxillary sinus tenderness. Right sinus exhibits no frontal sinus tenderness. Left sinus exhibits maxillary sinus tenderness. Left sinus exhibits no frontal sinus tenderness.  Mouth/Throat: Uvula is midline and mucous membranes are normal. Posterior oropharyngeal erythema present.  Neck: Normal range of motion. Neck supple.  Cardiovascular: Normal rate, regular rhythm and normal heart sounds.   Pulmonary/Chest: Effort normal. No accessory muscle usage. No respiratory distress. She has no decreased breath sounds. She has no wheezes. She has rhonchi (slight coarse breath sounds in upper lobes).  Lymphadenopathy:    She has no cervical adenopathy.  Neurological: She is alert and oriented to person, place, and time.  Skin: Skin is warm and dry. Capillary refill takes less than 2 seconds.  Psychiatric: She has a normal mood and affect. Her behavior is normal. Judgment and thought content normal.    Urgent Care Course     Procedures (including critical care time)  Labs Review Labs Reviewed - No data to display  Imaging Review No results found.   Visual Acuity Review  Right Eye Distance:   Left Eye Distance:   Bilateral Distance:    Right Eye Near:   Left Eye Near:     Bilateral Near:         MDM   1. Acute non-recurrent ethmoidal sinusitis    Recommend start Augmentin 875mg  twice a day as directed. May continue Sudafed every 8 hours as needed- discussed that it may raise her blood pressure. May continue Hydrocodone cough syrup 1 teaspoon every 6 hours as needed. [place] Controlled Substance Registry Reviewed- Rx for Xanax in October 2017- otherwise no other active Rx in the past 6 months. Encouraged to increase fluid intake to help loosen mucus. Recommend follow-up with her primary care provider in 3 to 4 days if not improving.      Sudie GrumblingAnn Berry Emerlyn Mehlhoff, NP 10/13/16 [phone]

## 2016-10-13 NOTE — Discharge Instructions (Signed)
Recommend start Augmentin 875mg  twice a day as directed. May continue Sudafed every 8 hours as needed. May also take Hydrocodone cough syrup 1 teaspoon every 6 hours as needed for cough. Increase fluid intake to help loosen mucus. Follow-up with your primary care provider in 3 to 4 days if not improving.

## 2016-10-25 ENCOUNTER — Encounter: Payer: Self-pay | Admitting: Internal Medicine

## 2016-10-25 ENCOUNTER — Ambulatory Visit (INDEPENDENT_AMBULATORY_CARE_PROVIDER_SITE_OTHER): Payer: Medicare Other | Admitting: Internal Medicine

## 2016-10-25 VITALS — BP 164/62 | HR 78 | Temp 97.7°F | Ht 66.0 in | Wt 166.8 lb

## 2016-10-25 DIAGNOSIS — J40 Bronchitis, not specified as acute or chronic: Secondary | ICD-10-CM | POA: Diagnosis not present

## 2016-10-25 MED ORDER — PREDNISONE 10 MG PO TABS: ORAL_TABLET | ORAL | 0 refills | Status: DC

## 2016-10-25 MED ORDER — LEVOFLOXACIN 500 MG PO TABS: 500.0000 mg | ORAL_TABLET | Freq: Every day | ORAL | 0 refills | Status: DC

## 2016-10-25 NOTE — Patient Instructions (Signed)
Increase fluids - need 80 oz per day  Start eating small amounts every 2-3 hours

## 2016-10-25 NOTE — Progress Notes (Signed)
Date:  10/25/2016   Name:  Jordan Montoya   DOB:  1933/10/31   MRN:  [id number]   Chief Complaint: Cough (Green production. Been on cough syrup and not helping. Not eating or drinking. Vomited a lot of the day Saturday. Dizziness comes and goes. ) Cough  This is a chronic problem. The current episode started 1 to 4 weeks ago. The problem has been gradually improving. The problem occurs every few minutes. The cough is non-productive. Associated symptoms include shortness of breath and wheezing. Pertinent negatives include no chest pain, chills, fever or headaches.  She was seen 2 weeks ago in UC after having a high fever and developing green sputum and cough.  She took one week of Augmentin and had hydrocodone cough syrup.  She was feeling improved then 5 days ago began to have more cough - this time mostly dry but occasionally productive.  She has wheezing.  Her appetite is significantly down.    Review of Systems  Constitutional: Positive for fatigue. Negative for appetite change, chills and fever.  Eyes: Negative for visual disturbance.  Respiratory: Positive for cough, shortness of breath and wheezing. Negative for chest tightness.   Cardiovascular: Negative for chest pain, palpitations and leg swelling.  Gastrointestinal: Negative for diarrhea and vomiting.  Genitourinary: Negative for difficulty urinating and dysuria.  Musculoskeletal: Positive for arthralgias.  Neurological: Positive for weakness. Negative for dizziness and headaches.    Patient Active Problem List   Diagnosis Date Noted  . Type 2 diabetes mellitus with hyperosmolarity without coma, without long-term current use of insulin (HCC) 09/30/2015  . Environmental and seasonal allergies 05/30/2015  . Hip pain, chronic 01/27/2015  . 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 Avitaminosis D 11/25/2014  . Psoriasis 11/25/2014  . Cyanocobalamine deficiency (non anemic)  11/25/2014  . HLD (hyperlipidemia) 08/20/2011    Prior to Admission medications   Medication Sig Start Date End Date Taking? Authorizing Provider  aspirin 81 MG chewable tablet Chew 1 tablet by mouth daily.   Yes Historical Provider, MD  COSENTYX SENSOREADY 300 DOSE 150 MG/ML SOAJ Inject 600 mg into the skin every 30 (thirty) days. 01/26/16  Yes Historical Provider, MD  Ergocalciferol (VITAMIN D2) 2000 units TABS Take 1 tablet by mouth daily.   Yes Historical Provider, MD  estradiol (ESTRACE) 0.1 MG/GM vaginal cream Place 1 Applicatorful vaginally at bedtime. Pea sized amount to internal labia nightly 09/29/16  Yes Reubin Milan, MD  glimepiride (AMARYL) 1 MG tablet TAKE 1 TABLET (1 MG TOTAL) BY MOUTH DAILY WITH BREAKFAST. 07/22/16  Yes Reubin Milan, MD  HYDROcodone-homatropine [facility]) 5-1.5 MG/5ML syrup Take 5 mLs by mouth every 6 (six) hours as needed for cough. 10/13/16  Yes Sudie Grumbling, NP  hydrocortisone (ANUSOL-HC) 2.5 % rectal cream Place 1 application rectally 2 (two) times daily. 01/29/16  Yes Reubin Milan, MD  hydrOXYzine (ATARAX/VISTARIL) 10 MG tablet Take 10 mg by mouth daily. 09/20/16  Yes Historical Provider, MD  JANUVIA 100 MG tablet TAKE 1 TABLET (100 MG TOTAL) BY MOUTH DAILY. 05/23/16  Yes Reubin Milan, MD  PARoxetine (PAXIL) 30 MG tablet TAKE 1/2 TABLET BY MOUTH EVERY DAY 05/20/16  Yes Reubin Milan, MD    Allergies  Allergen Reactions  . Metformin Nausea Only    Past Surgical History:  Procedure Laterality Date  . ABDOMINAL HYSTERECTOMY    . BREAST BIOPSY Left    neg-bx/clip  . BREAST  CYST EXCISION Left    neg  . EYE SURGERY Bilateral    cataracts  . PARTIAL HYSTERECTOMY     one ovary remains    Social History  Substance Use Topics  . Smoking status: Never Smoker  . Smokeless tobacco: Never Used  . Alcohol use No     Medication list has been reviewed and updated.   Physical Exam  Constitutional: She is oriented to person, place,  and time. She appears well-developed. She has a sickly appearance (appears fatigued). No distress.  HENT:  Head: Normocephalic and atraumatic.  Cardiovascular: Normal rate, regular rhythm and normal heart sounds.   Pulmonary/Chest: Effort normal. No respiratory distress. She has no decreased breath sounds. She has wheezes in the left upper field. She has no rhonchi. She has no rales.  Abdominal: Soft. Bowel sounds are normal.  Musculoskeletal: Normal range of motion. She exhibits no edema.  Neurological: She is alert and oriented to person, place, and time.  Skin: Skin is warm, dry and intact. No rash noted.  Psychiatric: She has a normal mood and affect. Her speech is normal and behavior is normal. Thought content normal.  Nursing note and vitals reviewed.   BP (!) 164/62 (BP Location: Right Arm, Patient Position: Sitting, Cuff Size: Normal)   Pulse 78   Ht 5\' 6"  (1.676 m)   Wt 166 lb 12.8 oz (75.7 kg)   SpO2 93%   BMI 26.92 kg/m   Assessment and Plan: 1. Bronchitis Increase fluids and food intake Measure hydrocodone syrup 1 tsp at hs Continue Delsym during the day Follow up in one week if not improving   Meds ordered this encounter  Medications  . levofloxacin (LEVAQUIN) 500 MG tablet    Sig: Take 1 tablet (500 mg total) by mouth daily.    Dispense:  7 tablet    Refill:  0  . predniSONE (DELTASONE) 10 MG tablet    Sig: Take 6 on day 1, 5 on day 2, 4 on day 3, 3 on day 4, 2 on day 5 and 1 on day 1 then stop.    Dispense:  21 tablet    Refill:  0    Bari EdwardLaura Daielle Melcher, MD [facility]  10/25/2016

## 2017-01-14 LAB — HEPATIC FUNCTION PANEL
ALT: 11 (ref 7–35)
AST: 18 (ref 13–35)
Alkaline Phosphatase: 87 (ref 25–125)
BILIRUBIN, TOTAL: 0.2

## 2017-01-14 LAB — CBC AND DIFFERENTIAL
HEMATOCRIT: 34 — AB (ref 36–46)
Hemoglobin: 11.2 — AB (ref 12.0–16.0)
Platelets: 259 (ref 150–399)
WBC: 4.3

## 2017-01-14 LAB — HM HEPATITIS C SCREENING LAB: HM HEPATITIS C SCREENING: NEGATIVE

## 2017-01-18 ENCOUNTER — Other Ambulatory Visit: Payer: Self-pay | Admitting: Internal Medicine

## 2017-01-18 DIAGNOSIS — Z1231 Encounter for screening mammogram for malignant neoplasm of breast: Secondary | ICD-10-CM

## 2017-01-19 ENCOUNTER — Other Ambulatory Visit: Payer: Self-pay | Admitting: Internal Medicine

## 2017-01-19 DIAGNOSIS — E1100 Type 2 diabetes mellitus with hyperosmolarity without nonketotic hyperglycemic-hyperosmolar coma (NKHHC): Secondary | ICD-10-CM

## 2017-01-27 ENCOUNTER — Encounter: Payer: Self-pay | Admitting: Internal Medicine

## 2017-01-27 ENCOUNTER — Ambulatory Visit (INDEPENDENT_AMBULATORY_CARE_PROVIDER_SITE_OTHER): Payer: Medicare Other | Admitting: Internal Medicine

## 2017-01-27 VITALS — BP 138/62 | HR 84 | Ht 66.0 in | Wt 170.0 lb

## 2017-01-27 DIAGNOSIS — E1100 Type 2 diabetes mellitus with hyperosmolarity without nonketotic hyperglycemic-hyperosmolar coma (NKHHC): Secondary | ICD-10-CM | POA: Diagnosis not present

## 2017-01-27 DIAGNOSIS — E782 Mixed hyperlipidemia: Secondary | ICD-10-CM

## 2017-01-27 NOTE — Progress Notes (Signed)
Date:  01/27/2017   Name:  Jordan Montoya   DOB:  08-13-1933   MRN:  [id number]   Chief Complaint: Diabetes (BS this morning 130. ) Diabetes  She presents for her follow-up diabetic visit. She has type 2 diabetes mellitus. Pertinent negatives for hypoglycemia include no headaches or tremors. Pertinent negatives for diabetes include no chest pain, no fatigue, no polydipsia and no polyuria. Current diabetic treatment includes oral agent (dual therapy). She is compliant with treatment most of the time.    Lab Results  Component Value Date   HGBA1C 6.4 (H) 09/29/2016     Review of Systems  Constitutional: Negative for appetite change, fatigue, fever and unexpected weight change.  HENT: Negative for tinnitus and trouble swallowing.   Eyes: Negative for visual disturbance.  Respiratory: Negative for cough, chest tightness and shortness of breath.   Cardiovascular: Negative for chest pain, palpitations and leg swelling.  Gastrointestinal: Negative for abdominal pain.  Endocrine: Negative for polydipsia and polyuria.  Genitourinary: Negative for dysuria and hematuria.  Musculoskeletal: Negative for arthralgias.  Neurological: Negative for tremors, numbness and headaches.  Psychiatric/Behavioral: Negative for dysphoric mood.    Patient Active Problem List   Diagnosis Date Noted  . Type 2 diabetes mellitus with hyperosmolarity without coma, without long-term current use of insulin (HCC) 09/30/2015  . Environmental and seasonal allergies 05/30/2015  . Hip pain, chronic 01/27/2015  . 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 Avitaminosis D 11/25/2014  . Psoriasis 11/25/2014  . 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 HLD (hyperlipidemia) 08/20/2011    Prior to Admission medications   Medication Sig Start Date End Date Taking? Authorizing Provider  aspirin 81 MG chewable tablet Chew 1 tablet by mouth daily.     [provider]  COSENTYX SENSOREADY 300 DOSE 150 MG/ML SOAJ Inject 600 mg into the skin every 30 (thirty) days. 01/26/16   [provider]  Ergocalciferol (VITAMIN D2) 2000 units TABS Take 1 tablet by mouth daily.    [provider]  estradiol (ESTRACE) 0.1 MG/GM vaginal cream Place 1 Applicatorful vaginally at bedtime. Pea sized amount to internal labia nightly 09/29/16   Reubin Milan, MD  glimepiride (AMARYL) 1 MG tablet TAKE 1 TABLET (1 MG TOTAL) BY MOUTH DAILY WITH BREAKFAST. 01/19/17   Reubin Milan, MD  hydrocortisone (ANUSOL-HC) 2.5 % rectal cream Place 1 application rectally 2 (two) times daily. 01/29/16   Reubin Milan, MD  hydrOXYzine (ATARAX/VISTARIL) 10 MG tablet Take 10 mg by mouth daily. 09/20/16   [provider]  JANUVIA 100 MG tablet TAKE 1 TABLET (100 MG TOTAL) BY MOUTH DAILY. 05/23/16   Reubin Milan, MD  PARoxetine (PAXIL) 30 MG tablet TAKE 1/2 TABLET BY MOUTH EVERY DAY 05/20/16   Reubin Milan, MD  predniSONE (DELTASONE) 10 MG tablet Take 6 on day 1, 5 on day 2, 4 on day 3, 3 on day 4, 2 on day 5 and 1 on day 1 then stop. 10/25/16   Reubin Milan, MD    Allergies  Allergen Reactions  . Metformin Nausea Only    Past Surgical History:  Procedure Laterality Date  . ABDOMINAL HYSTERECTOMY    . BREAST BIOPSY Left    neg-bx/clip  . BREAST CYST EXCISION Left    neg  . EYE SURGERY Bilateral    cataracts  . PARTIAL HYSTERECTOMY     one ovary remains    Social History  Substance Use Topics  . Smoking status: Never Smoker  . Smokeless tobacco: Never Used  . Alcohol use No     Medication list has been reviewed and updated.   Physical Exam  Constitutional: She is oriented to person, place, and time. She appears well-developed. No distress.  HENT:  Head: Normocephalic and atraumatic.  Pulmonary/Chest: Effort normal. No respiratory distress.  Musculoskeletal: Normal range of motion.  Neurological: She is  alert and oriented to person, place, and time.  Skin: Skin is warm and dry. No rash noted.  Psychiatric: She has a normal mood and affect. Her behavior is normal. Thought content normal.  Nursing note and vitals reviewed.   BP 138/62   Pulse 84   Ht 5\' 6"  (1.676 m)   Wt 170 lb (77.1 kg)   SpO2 96%   BMI 27.44 kg/m   Assessment and Plan: 1. Type 2 diabetes mellitus with hyperosmolarity without coma, without long-term current use of insulin (HCC) Continue medications and diet - Hemoglobin A1c - Basic metabolic panel  2. Mixed hyperlipidemia Check fasting next visit   No orders of the defined types were placed in this encounter.   Bari EdwardLaura Zeynab Klett, MD [facility]  01/27/2017

## 2017-01-28 ENCOUNTER — Encounter: Payer: Self-pay | Admitting: Internal Medicine

## 2017-01-28 LAB — BASIC METABOLIC PANEL
BUN/Creatinine Ratio: 24 (ref 12–28)
BUN: 17 mg/dL (ref 8–27)
CALCIUM: 9.3 mg/dL (ref 8.7–10.3)
CO2: 23 mmol/L (ref 20–29)
CREATININE: 0.71 mg/dL (ref 0.57–1.00)
Chloride: 103 mmol/L (ref 96–106)
GFR calc Af Amer: 92 mL/min/{1.73_m2} (ref >59)
GFR, EST NON AFRICAN AMERICAN: 80 mL/min/{1.73_m2} (ref >59)
GLUCOSE: 101 mg/dL — AB (ref 65–99)
POTASSIUM: 4.3 mmol/L (ref 3.5–5.2)
SODIUM: 140 mmol/L (ref 134–144)

## 2017-01-28 LAB — HEMOGLOBIN A1C
Est. average glucose Bld gHb Est-mCnc: 151 mg/dL
HEMOGLOBIN A1C: 6.9 % — AB (ref 4.8–5.6)

## 2017-02-21 ENCOUNTER — Ambulatory Visit
Admission: RE | Admit: 2017-02-21 | Discharge: 2017-02-21 | Disposition: A | Discharge status: Home / Self Care | Payer: Medicare Other | Source: Ambulatory Visit | Attending: Internal Medicine | Admitting: Internal Medicine

## 2017-02-21 DIAGNOSIS — Z1231 Encounter for screening mammogram for malignant neoplasm of breast: Secondary | ICD-10-CM | POA: Insufficient documentation

## 2017-03-02 ENCOUNTER — Other Ambulatory Visit: Payer: Self-pay | Admitting: Internal Medicine

## 2017-03-02 DIAGNOSIS — J3089 Other allergic rhinitis: Secondary | ICD-10-CM

## 2017-03-21 ENCOUNTER — Other Ambulatory Visit: Payer: Self-pay | Admitting: Internal Medicine

## 2017-03-21 DIAGNOSIS — J019 Acute sinusitis, unspecified: Secondary | ICD-10-CM

## 2017-04-11 ENCOUNTER — Ambulatory Visit: Payer: Self-pay

## 2017-05-25 ENCOUNTER — Other Ambulatory Visit: Payer: Self-pay | Admitting: Internal Medicine

## 2017-05-25 ENCOUNTER — Ambulatory Visit (INDEPENDENT_AMBULATORY_CARE_PROVIDER_SITE_OTHER): Payer: Medicare Other | Admitting: Internal Medicine

## 2017-05-25 ENCOUNTER — Encounter: Payer: Self-pay | Admitting: Internal Medicine

## 2017-05-25 VITALS — BP 138/80 | HR 88 | Ht 66.0 in | Wt 175.0 lb

## 2017-05-25 DIAGNOSIS — E1100 Type 2 diabetes mellitus with hyperosmolarity without nonketotic hyperglycemic-hyperosmolar coma (NKHHC): Secondary | ICD-10-CM

## 2017-05-25 DIAGNOSIS — R300 Dysuria: Secondary | ICD-10-CM | POA: Diagnosis not present

## 2017-05-25 DIAGNOSIS — N949 Unspecified condition associated with female genital organs and menstrual cycle: Secondary | ICD-10-CM

## 2017-05-25 LAB — POCT URINALYSIS DIPSTICK
BILIRUBIN UA: NEGATIVE
GLUCOSE UA: NEGATIVE
KETONES UA: NEGATIVE
LEUKOCYTES UA: NEGATIVE
Nitrite, UA: NEGATIVE
Protein, UA: NEGATIVE
Spec Grav, UA: 1.02 (ref 1.010–1.025)
Urobilinogen, UA: 0.2 E.U./dL
pH, UA: 5 (ref 5.0–8.0)

## 2017-05-25 NOTE — Progress Notes (Signed)
Date:  05/25/2017   Name:  Jordan Montoya   DOB:  [birth year]   MRN:  [id number]   Chief Complaint: Urinary Tract Infection (Had for months. hurting when urinating. Pain hurts so bad sometimes feels like razor cutting her. Burning and itching. Back pain.) Urinary Tract Infection   This is a chronic problem. The current episode started more than 1 month ago. The problem occurs every urination. The problem has been unchanged. The quality of the pain is described as burning. The pain is moderate. There has been no fever. Pertinent negatives include no chills, flank pain, hesitancy or urgency.    Review of Systems  Constitutional: Negative for chills, fatigue and fever.  Respiratory: Negative for chest tightness and shortness of breath.   Cardiovascular: Negative for chest pain and palpitations.  Genitourinary: Positive for genital sores. Negative for flank pain, hesitancy, urgency and vaginal bleeding.    Patient Active Problem List   Diagnosis Date Noted  . Type 2 diabetes mellitus with hyperosmolarity without coma, without long-term current use of insulin (HCC) 09/30/2015  . Environmental and seasonal allergies 05/30/2015  . Hip pain, chronic 01/27/2015  . 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 Avitaminosis D 11/25/2014  . Psoriasis 11/25/2014  . 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 HLD (hyperlipidemia) 08/20/2011    Prior to Admission medications   Medication Sig Start Date End Date Taking? Authorizing Provider  COSENTYX SENSOREADY 300 DOSE 150 MG/ML SOAJ Inject 600 mg into the skin every 30 (thirty) days. 01/26/16  Yes [provider]  [facility] NASAL DECONGESTANT 30 MG tablet TAKE 1 TABLET (30 MG TOTAL) BY MOUTH EVERY 8 (EIGHT) HOURS AS NEEDED FOR CONGESTION. 03/21/17  Yes Reubin MilanBerglund, Dixie Coppa H, MD  docusate sodium (STOOL SOFTENER) 250 MG capsule Take 250 mg by mouth daily.   Yes [provider]    Ergocalciferol (VITAMIN D2) 2000 units TABS Take 1 tablet by mouth daily.   Yes [provider]  fluticasone (FLONASE) 50 MCG/ACT nasal spray PLACE 2 SPRAYS INTO BOTH NOSTRILS DAILY. 03/02/17  Yes Reubin MilanBerglund, Aanyah Loa H, MD  glimepiride (AMARYL) 1 MG tablet TAKE 1 TABLET (1 MG TOTAL) BY MOUTH DAILY WITH BREAKFAST. 01/19/17  Yes Reubin MilanBerglund, Hyland Mollenkopf H, MD  hydrOXYzine (ATARAX/VISTARIL) 10 MG tablet Take 10 mg by mouth daily. 09/20/16  Yes [provider]  JANUVIA 100 MG tablet TAKE 1 TABLET (100 MG TOTAL) BY MOUTH DAILY. 05/23/16  Yes Reubin MilanBerglund, Folashade Gamboa H, MD  PARoxetine (PAXIL) 30 MG tablet TAKE 1/2 TABLET BY MOUTH EVERY DAY 05/20/16  Yes Reubin MilanBerglund, Margurite Duffy H, MD    Allergies  Allergen Reactions  . Metformin Nausea Only    Past Surgical History:  Procedure Laterality Date  . ABDOMINAL HYSTERECTOMY    . BREAST BIOPSY Left    neg-bx/clip  . BREAST CYST EXCISION Left    neg  . EYE SURGERY Bilateral    cataracts  . PARTIAL HYSTERECTOMY     one ovary remains    Social History  Substance Use Topics  . Smoking status: Never Smoker  . Smokeless tobacco: Never Used  . Alcohol use No     Medication list has been reviewed and updated.  PHQ 2/9 Scores 05/25/2017 01/29/2016 01/27/2015  PHQ - 2 Score 0 0 0  PHQ- 9 Score 0 - -    Physical Exam  Constitutional: She is oriented to person, place, and time. She appears well-developed. No distress.  HENT:  Head: Normocephalic  and atraumatic.  Cardiovascular: Normal rate, regular rhythm and normal heart sounds.   Pulmonary/Chest: Effort normal and breath sounds normal. No respiratory distress. She has no wheezes.  Genitourinary:  Genitourinary Comments: Deferred - sx unchanged from last visit  Musculoskeletal: Normal range of motion.  Neurological: She is alert and oriented to person, place, and time.  Skin: Skin is warm and dry. No rash noted.  Psychiatric: She has a normal mood and affect. Her behavior is normal. Thought content  normal.  Nursing note and vitals reviewed.  Urine dipstick shows negative for all components.  Micro exam: not done.  BP 138/80   Pulse 88   Ht 5\' 6"  (1.676 m)   Wt 175 lb (79.4 kg)   SpO2 95%   BMI 28.25 kg/m   Assessment and Plan: 1. Pain of female genitalia Did not respond to estrogen topically Use diaper rash ointment until seen by GYN No evidence of UTI - Ambulatory referral to Obstetrics / Gynecology  2. Dysuria - POCT urinalysis dipstick - negative  3. Type 2 diabetes mellitus with hyperosmolarity without coma, without long-term current use of insulin (HCC) Will check labs at visit in December - Microalbumin / creatinine urine ratio   No orders of the defined types were placed in this encounter.   Partially dictated using Animal nutritionist. Any errors are unintentional.  Bari Edward, MD [facility]  05/25/2017

## 2017-05-25 NOTE — Patient Instructions (Signed)
Use diaper rash ointment to external labia as needed for comfort.

## 2017-05-26 ENCOUNTER — Other Ambulatory Visit: Payer: Self-pay | Admitting: Internal Medicine

## 2017-05-28 LAB — MICROALBUMIN / CREATININE URINE RATIO
CREATININE, UR: 106.8 mg/dL
MICROALBUM., U, RANDOM: 16.3 ug/mL
Microalb/Creat Ratio: 15.3 mg/g creat (ref 0.0–30.0)

## 2017-06-16 ENCOUNTER — Ambulatory Visit (INDEPENDENT_AMBULATORY_CARE_PROVIDER_SITE_OTHER): Payer: Medicare Other

## 2017-06-16 VITALS — BP 128/70 | HR 90 | Temp 97.5°F | Resp 16 | Ht 66.0 in | Wt 172.0 lb

## 2017-06-16 DIAGNOSIS — Z23 Encounter for immunization: Secondary | ICD-10-CM | POA: Diagnosis not present

## 2017-06-16 DIAGNOSIS — Z0000 Encounter for general adult medical examination without abnormal findings: Secondary | ICD-10-CM

## 2017-06-16 NOTE — Progress Notes (Signed)
Subjective:   Jordan Montoya is a 83 y.o. female who presents for Medicare Annual (Subsequent) preventive examination.  Review of Systems:  N/A Cardiac Risk Factors include: advanced age (>655men, 6>65 women);diabetes mellitus;dyslipidemia;sedentary lifestyle     Objective:     Vitals: BP 128/70 (BP Location: Right Arm, Patient Position: Sitting, Cuff Size: Normal)   Pulse 90   Temp (!) 97.5 F (36.4 C) (Oral)   Resp 16   Ht 5\' 6"  (1.676 m)   Wt 172 lb (78 kg)   BMI 27.76 kg/m   Body mass index is 27.76 kg/m.   Tobacco Social History   Tobacco Use  Smoking Status Former Smoker  . Packs/day: 0.25  . Years: 20.00  . Pack years: 5.00  . Types: Cigarettes  Smokeless Tobacco Never Used     Counseling given: No   History reviewed. No pertinent past medical history. Past Surgical History:  Procedure Laterality Date  . ABDOMINAL HYSTERECTOMY    . BREAST BIOPSY Left    neg-bx/clip  . BREAST CYST EXCISION Left    neg  . EYE SURGERY Bilateral    cataracts  . PARTIAL HYSTERECTOMY     one ovary remains   Family History  Problem Relation Age of Onset  . Cancer Mother   . Heart disease Father   . Diabetes Son   . Breast cancer Neg Hx    Social History   Substance and Sexual Activity  Sexual Activity No    Outpatient Encounter Medications as of 06/16/2017  Medication Sig  . COSENTYX SENSOREADY 300 DOSE 150 MG/ML SOAJ Inject 600 mg into the skin every 30 (thirty) days.  . [facility] NASAL DECONGESTANT 30 MG tablet TAKE 1 TABLET (30 MG TOTAL) BY MOUTH EVERY 8 (EIGHT) HOURS AS NEEDED FOR CONGESTION.  Marland Kitchen. docusate sodium (STOOL SOFTENER) 250 MG capsule Take 250 mg by mouth daily.  . Ergocalciferol (VITAMIN D2) 2000 units TABS Take 1 tablet by mouth daily.  . fluticasone (FLONASE) 50 MCG/ACT nasal spray PLACE 2 SPRAYS INTO BOTH NOSTRILS DAILY.  Marland Kitchen. glimepiride (AMARYL) 1 MG tablet TAKE 1 TABLET (1 MG TOTAL) BY MOUTH DAILY WITH BREAKFAST.  . hydrOXYzine (ATARAX/VISTARIL)  10 MG tablet Take 10 mg by mouth daily.  Marland Kitchen. JANUVIA 100 MG tablet TAKE 1 TABLET (100 MG TOTAL) BY MOUTH DAILY.  Marland Kitchen. PARoxetine (PAXIL) 30 MG tablet TAKE 1/2 TABLET BY MOUTH EVERY DAY   No facility-administered encounter medications on file as of 06/16/2017.     Activities of Daily Living In your present state of health, do you have any difficulty performing the following activities: 06/16/2017 05/25/2017  Hearing? N N  Vision? N N  Difficulty concentrating or making decisions? N N  Walking or climbing stairs? Y N  Comment joint pain and shortness of breath -  Dressing or bathing? N N  Doing errands, shopping? N N  Preparing Food and eating ? N -  Using the Toilet? N -  In the past six months, have you accidently leaked urine? N -  Do you have problems with loss of bowel control? N -  Managing your Medications? N -  Managing your Finances? N -  Housekeeping or managing your Housekeeping? N -  Some recent data might be hidden    Patient Care Team: Reubin MilanBerglund, Laura H, MD as PCP - General (Internal Medicine) Wanita ChamberlainBenitez-Graham, Ana M, MD (Dermatology)    Assessment:     Exercise Activities and Dietary recommendations    Goals  None     Fall Risk Fall Risk  06/16/2017 05/25/2017 01/29/2016 01/27/2015  Falls in the past year? No No No No   Depression Screen PHQ 2/9 Scores 06/16/2017 05/25/2017 01/29/2016 01/27/2015  PHQ - 2 Score 0 0 0 0  PHQ- 9 Score 1 0 - -     Cognitive Function     6CIT Screen 06/16/2017  What Year? 0 points  What month? 0 points  What time? 0 points  Count back from 20 0 points  Months in reverse 0 points  Repeat phrase 4 points  Total Score 4    Immunization History  Administered Date(s) Administered  . Influenza-Unspecified 05/08/2015, 05/04/2016, 05/24/2017  . Pneumococcal Conjugate-13 06/06/2014  . Pneumococcal Polysaccharide-23 06/16/2017  . Tdap 08/03/2011   Screening Tests Health Maintenance  Topic Date Due  . HEMOGLOBIN A1C   07/29/2017  . OPHTHALMOLOGY EXAM  08/17/2017  . FOOT EXAM  05/25/2018  . URINE MICROALBUMIN  05/25/2018  . TETANUS/TDAP  08/02/2021  . INFLUENZA VACCINE  Completed  . DEXA SCAN  Completed  . PNA vac Low Risk Adult  Addressed      Plan:    I have personally reviewed and addressed the Medicare Annual Wellness questionnaire and have noted the following in the patient's chart:  A. Medical and social history B. Use of alcohol, tobacco or illicit drugs  C. Current medications and supplements D. Functional ability and status E.  Nutritional status F.  Physical activity G. Advance directives H. List of other physicians I.  Hospitalizations, surgeries, and ER visits in previous 12 months J.  Vitals K. Screenings such as hearing and vision if needed, cognitive and depression L. Referrals and appointments - none  In addition, I have reviewed and discussed with patient certain preventive protocols, quality metrics, and best practice recommendations. A written personalized care plan for preventive services as well as general preventive health recommendations were provided to patient.  See attached scanned questionnaire for additional information.   Signed,  Deon PillingAmmie Ayrton Mcvay, LPN Nurse Health Advisor  MD Recommendations: None

## 2017-06-16 NOTE — Patient Instructions (Signed)
[name] , Thank you for taking time to come for your Medicare Wellness Visit. I appreciate your ongoing commitment to your health goals. Please review the following plan we discussed and let me know if I can assist you in the future.   Screening recommendations/referrals: Colonoscopy: Colon cancer screenings no longer required Mammogram: Completed 02/21/17. Repeat breast cancer screenings every year Bone Density: Completed 08/02/10 Recommended yearly ophthalmology/optometry visit for glaucoma screening and checkup Recommended yearly dental visit for hygiene and checkup  Vaccinations: Influenza vaccine: Up to date Pneumococcal vaccine: Given today Tdap vaccine: Up to date Shingles vaccine: Contraindicated  Advanced directives: Advance directive discussed with you today. I have provided a copy for you to complete at home and have notarized. Once this is complete please bring a copy in to our office so we can scan it into your chart.  Conditions/risks identified: Recommended to attend [employer]  Next appointment: You are scheduled to see Dr. Judithann Montoya on 07/22/17 @ 9:30am.   Please schedule your annual wellness exam with your Nurse Health Advisor in one year.    Preventive Care 7065 Years and Older, Female Preventive care refers to lifestyle choices and visits with your health care provider that can promote health and wellness. What does preventive care include?  A yearly physical exam. This is also called an annual well check.  Dental exams once or twice a year.  Routine eye exams. Ask your health care provider how often you should have your eyes checked.  Personal lifestyle choices, including:  Daily care of your teeth and gums.  Regular physical activity.  Eating a healthy diet.  Avoiding tobacco and drug use.  Limiting alcohol use.  Practicing safe sex.  Taking low-dose aspirin every day.  Taking vitamin and mineral supplements as recommended by your health care  provider. What happens during an annual well check? The services and screenings done by your health care provider during your annual well check will depend on your age, overall health, lifestyle risk factors, and family history of disease. Counseling  Your health care provider may ask you questions about your:  Alcohol use.  Tobacco use.  Drug use.  Emotional well-being.  Home and relationship well-being.  Sexual activity.  Eating habits.  History of falls.  Memory and ability to understand (cognition).  Work and work Astronomerenvironment.  Reproductive health. Screening  You may have the following tests or measurements:  Height, weight, and BMI.  Blood pressure.  Lipid and cholesterol levels. These may be checked every 5 years, or more frequently if you are over 748 years old.  Skin check.  Lung cancer screening. You may have this screening every year starting at age 81 if you have a 30-pack-year history of smoking and currently smoke or have quit within the past 15 years.  Fecal occult blood test (FOBT) of the stool. You may have this test every year starting at age 81.  Flexible sigmoidoscopy or colonoscopy. You may have a sigmoidoscopy every 5 years or a colonoscopy every 10 years starting at age 81.  Hepatitis C blood test.  Hepatitis B blood test.  Sexually transmitted disease (STD) testing.  Diabetes screening. This is done by checking your blood sugar (glucose) after you have not eaten for a while (fasting). You may have this done every 1-3 years.  Bone density scan. This is done to screen for osteoporosis. You may have this done starting at age 81.  Mammogram. This may be done every 1-2 years. Talk to your  health care provider about how often you should have regular mammograms. Talk with your health care provider about your test results, treatment options, and if necessary, the need for more tests. Vaccines  Your health care provider may recommend certain  vaccines, such as:  Influenza vaccine. This is recommended every year.  Tetanus, diphtheria, and acellular pertussis (Tdap, Td) vaccine. You may need a Td booster every 10 years.  Zoster vaccine. You may need this after age 64.  Pneumococcal 13-valent conjugate (PCV13) vaccine. One dose is recommended after age 9.  Pneumococcal polysaccharide (PPSV23) vaccine. One dose is recommended after age 60. Talk to your health care provider about which screenings and vaccines you need and how often you need them. This information is not intended to replace advice given to you by your health care provider. Make sure you discuss any questions you have with your health care provider. Document Released: 08/15/2015 Document Revised: 04/07/2016 Document Reviewed: 05/20/2015 Elsevier Interactive Patient Education  2017 [place] Prevention in the Home Falls can cause injuries. They can happen to people of all ages. There are many things you can do to make your home safe and to help prevent falls. What can I do on the outside of my home?  Regularly fix the edges of walkways and driveways and fix any cracks.  Remove anything that might make you trip as you walk through a door, such as a raised step or threshold.  Trim any bushes or trees on the path to your home.  Use bright outdoor lighting.  Clear any walking paths of anything that might make someone trip, such as rocks or tools.  Regularly check to see if handrails are loose or broken. Make sure that both sides of any steps have handrails.  Any raised decks and porches should have guardrails on the edges.  Have any leaves, snow, or ice cleared regularly.  Use sand or salt on walking paths during winter.  Clean up any spills in your garage right away. This includes oil or grease spills. What can I do in the bathroom?  Use night lights.  Install grab bars by the toilet and in the tub and shower. Do not use towel bars as grab  bars.  Use non-skid mats or decals in the tub or shower.  If you need to sit down in the shower, use a plastic, non-slip stool.  Keep the floor dry. Clean up any water that spills on the floor as soon as it happens.  Remove soap buildup in the tub or shower regularly.  Attach bath mats securely with double-sided non-slip rug tape.  Do not have throw rugs and other things on the floor that can make you trip. What can I do in the bedroom?  Use night lights.  Make sure that you have a light by your bed that is easy to reach.  Do not use any sheets or blankets that are too big for your bed. They should not hang down onto the floor.  Have a firm chair that has side arms. You can use this for support while you get dressed.  Do not have throw rugs and other things on the floor that can make you trip. What can I do in the kitchen?  Clean up any spills right away.  Avoid walking on wet floors.  Keep items that you use a lot in easy-to-reach places.  If you need to reach something above you, use a strong step stool that has a  grab bar.  Keep electrical cords out of the way.  Do not use floor polish or wax that makes floors slippery. If you must use wax, use non-skid floor wax.  Do not have throw rugs and other things on the floor that can make you trip. What can I do with my stairs?  Do not leave any items on the stairs.  Make sure that there are handrails on both sides of the stairs and use them. Fix handrails that are broken or loose. Make sure that handrails are as long as the stairways.  Check any carpeting to make sure that it is firmly attached to the stairs. Fix any carpet that is loose or worn.  Avoid having throw rugs at the top or bottom of the stairs. If you do have throw rugs, attach them to the floor with carpet tape.  Make sure that you have a light switch at the top of the stairs and the bottom of the stairs. If you do not have them, ask someone to add them for  you. What else can I do to help prevent falls?  Wear shoes that:  Do not have high heels.  Have rubber bottoms.  Are comfortable and fit you well.  Are closed at the toe. Do not wear sandals.  If you use a stepladder:  Make sure that it is fully opened. Do not climb a closed stepladder.  Make sure that both sides of the stepladder are locked into place.  Ask someone to hold it for you, if possible.  Clearly mark and make sure that you can see:  Any grab bars or handrails.  First and last steps.  Where the edge of each step is.  Use tools that help you move around (mobility aids) if they are needed. These include:  Canes.  Walkers.  Scooters.  Crutches.  Turn on the lights when you go into a dark area. Replace any light bulbs as soon as they burn out.  Set up your furniture so you have a clear path. Avoid moving your furniture around.  If any of your floors are uneven, fix them.  If there are any pets around you, be aware of where they are.  Review your medicines with your doctor. Some medicines can make you feel dizzy. This can increase your chance of falling. Ask your doctor what other things that you can do to help prevent falls. This information is not intended to replace advice given to you by your health care provider. Make sure you discuss any questions you have with your health care provider. Document Released: 05/15/2009 Document Revised: 12/25/2015 Document Reviewed: 08/23/2014 Elsevier Interactive Patient Education  2017 [employer].

## 2017-06-20 ENCOUNTER — Other Ambulatory Visit: Payer: Self-pay | Admitting: Internal Medicine

## 2017-06-20 DIAGNOSIS — F329 Major depressive disorder, single episode, unspecified: Secondary | ICD-10-CM

## 2017-06-20 DIAGNOSIS — F32A Depression, unspecified: Secondary | ICD-10-CM

## 2017-06-21 ENCOUNTER — Telehealth: Payer: Self-pay

## 2017-06-21 NOTE — Telephone Encounter (Signed)
PA sent through covermymeds for Paroxetine [employer]. Response will be sent within 72 hours via FAX

## 2017-07-16 ENCOUNTER — Other Ambulatory Visit: Payer: Self-pay | Admitting: Internal Medicine

## 2017-07-16 DIAGNOSIS — E1100 Type 2 diabetes mellitus with hyperosmolarity without nonketotic hyperglycemic-hyperosmolar coma (NKHHC): Secondary | ICD-10-CM

## 2017-07-22 ENCOUNTER — Encounter: Payer: Self-pay | Admitting: Internal Medicine

## 2017-08-03 ENCOUNTER — Ambulatory Visit: Payer: Medicare Other | Admitting: Obstetrics and Gynecology

## 2017-08-03 ENCOUNTER — Encounter: Payer: Self-pay | Admitting: Obstetrics and Gynecology

## 2017-08-03 VITALS — BP 150/73 | HR 101 | Wt 162.3 lb

## 2017-08-03 DIAGNOSIS — N952 Postmenopausal atrophic vaginitis: Secondary | ICD-10-CM | POA: Diagnosis not present

## 2017-08-03 DIAGNOSIS — Q525 Fusion of labia: Secondary | ICD-10-CM | POA: Diagnosis not present

## 2017-08-03 DIAGNOSIS — K649 Unspecified hemorrhoids: Secondary | ICD-10-CM | POA: Diagnosis not present

## 2017-08-03 NOTE — Progress Notes (Signed)
Pt is here with c/o burning on urination but saw PCP and was told it was not a UTI. Pt also c/o itching, sores and some bleeding. States this comes and goes.

## 2017-08-03 NOTE — Patient Instructions (Addendum)
Atrophic Vaginitis Atrophic vaginitis is a condition in which the tissues that line the vagina become dry and thin. This condition is most common in women who have stopped having regular menstrual periods (menopause). This usually starts when a woman is 45-82 years old. Estrogen helps to keep the vagina moist. It stimulates the vagina to produce a clear fluid that lubricates the vagina for sexual intercourse. This fluid also protects the vagina from infection. Lack of estrogen can cause the lining of the vagina to get thinner and dryer. The vagina may also shrink in size. It may become less elastic. Atrophic vaginitis tends to get worse over time as a woman's estrogen level drops. What are the causes? This condition is caused by the normal drop in estrogen that happens around the time of menopause. What increases the risk? Certain conditions or situations may lower a woman's estrogen level, which increases her risk of atrophic vaginitis. These include:  Taking medicine that blocks estrogen.  Having ovaries removed surgically.  Being treated for cancer with X-ray treatment (radiation) or medicines (chemotherapy).  Exercising very hard and often.  Having an eating disorder (anorexia).  Giving birth or breastfeeding.  Being over the age of 50.  Smoking.  What are the signs or symptoms? Symptoms of this condition include:  Pain, soreness, or bleeding during sexual intercourse (dyspareunia).  Vaginal burning, irritation, or itching.  Pain or bleeding during a vaginal examination using a speculum (pelvic exam).  Loss of interest in sexual activity.  Having burning pain when passing urine.  Vaginal discharge that is brown or yellow.  In some cases, there are no symptoms. How is this diagnosed? This condition is diagnosed with a medical history and physical exam. This will include a pelvic exam that checks whether the inside of your vagina appears pale, thin, or dry. Rarely, you may  also have other tests, including:  A urine test.  A test that checks the acid balance in your vaginal fluid (acid balance test).  How is this treated? Treatment for this condition may depend on the severity of your symptoms. Treatment may include:  Using an over-the-counter vaginal lubricant before you have sexual intercourse.  Using a long-acting vaginal moisturizer.  Using low-dose vaginal estrogen for moderate to severe symptoms that do not respond to other treatments. Options include creams, tablets, and inserts (vaginal rings). Before using vaginal estrogen, tell your health care provider if you have a history of: ? Breast cancer. ? Endometrial cancer. ? Blood clots.  Taking medicines. You may be able to take a daily pill for dyspareunia. Discuss all of the risks of this medicine with your health care provider. It is usually not recommended for women who have a family history or personal history of breast cancer.  If your symptoms are very mild and you are not sexually active, you may not need treatment. Follow these instructions at home:  Take medicines only as directed by your health care provider. Do not use herbal or alternative medicines unless your health care provider says that you can.  Use over-the-counter creams, lubricants, or moisturizers for dryness only as directed by your health care provider.  If your atrophic vaginitis is caused by menopause, discuss all of your menopausal symptoms and treatment options with your health care provider.  Do not douche.  Do not use products that can make your vagina dry. These include: ? Scented feminine sprays. ? Scented tampons. ? Scented soaps.  If it hurts to have sex, talk with your sexual   partner. Contact a health care provider if:  Your discharge looks different than normal.  Your vagina has an unusual smell.  You have new symptoms.  Your symptoms do not improve with treatment.  Your symptoms get worse. This  information is not intended to replace advice given to you by your health care provider. Make sure you discuss any questions you have with your health care provider. Document Released: 12/03/2014 Document Revised: 12/25/2015 Document Reviewed: 07/10/2014 Elsevier Interactive Patient Education  2018 Elsevier Inc.  

## 2017-08-03 NOTE — Progress Notes (Signed)
GYNECOLOGY PROGRESS NOTE  Subjective:    Patient ID: Jordan Montoya, female    DOB: [birth year], 84 y.o.   MRN: [id number]  HPI  Patient is a 84 y.o. [id number] female who presents as a referral for complaints of vaginal pain, intermittent on and off for 2 years.  Notes that the pain feels like a burning sensation with urination and itching.  Has been seen by PCP at [facility] and ruled out for UTI. Notes that she was told that she did have some dryness in the vaginal area and was referred to a  GYN.    Past Medical History:  Diagnosis Date  . Avitaminosis D 11/25/2014  . Environmental and seasonal allergies 05/30/2015  . HLD (hyperlipidemia) 08/20/2011   Overview:  LDL goal <100 given DM2   . 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 Psoriasis 11/25/2014   followed by Dermatology   . Type 2 diabetes mellitus with hyperosmolarity without coma, without long-term current use of insulin (HCC) 09/30/2015    Past Surgical History:  Procedure Laterality Date  . ABDOMINAL HYSTERECTOMY    . BREAST BIOPSY Left    neg-bx/clip  . BREAST CYST EXCISION Left    neg  . EYE SURGERY Bilateral    cataracts  . PARTIAL HYSTERECTOMY     one ovary remains    Family History  Problem Relation Age of Onset  . Cancer Mother   . Heart disease Father   . Diabetes Son   . Breast cancer Neg Hx     Social History   Socioeconomic History  . Marital status: Widowed    Spouse name: Not on file  . Number of children: 2  . Years of education: Not on file  . Highest education level: Not on file  Social Needs  . Financial resource strain: Not hard at all  . Food insecurity - worry: Never true  . Food insecurity - inability: Never true  . Transportation needs - medical: No  . Transportation needs - non-medical: No  Occupational History  . Occupation: Retired  Tobacco Use  . Smoking status: Former Smoker    Packs/day: 0.25    Years:  20.00    Pack years: 5.00    Types: Cigarettes  . Smokeless tobacco: Never Used  Substance and Sexual Activity  . Alcohol use: No    Alcohol/week: 0.0 oz  . Drug use: No  . Sexual activity: No  Other Topics Concern  . Not on file  Social History Narrative  . Not on file    Allergies  Allergen Reactions  . Metformin Nausea Only    Current Outpatient Medications on File Prior to Visit  Medication Sig Dispense Refill  . COSENTYX SENSOREADY 300 DOSE 150 MG/ML SOAJ Inject 600 mg into the skin every 30 (thirty) days.    Marland Kitchen docusate sodium (STOOL SOFTENER) 250 MG capsule Take 250 mg by mouth daily.    . fluticasone (FLONASE) 50 MCG/ACT nasal spray PLACE 2 SPRAYS INTO BOTH NOSTRILS DAILY. 18 g 4  . glimepiride (AMARYL) 1 MG tablet TAKE 1 TABLET (1 MG TOTAL) BY MOUTH DAILY WITH BREAKFAST. 30 tablet 5  . hydrOXYzine (ATARAX/VISTARIL) 10 MG tablet Take 10 mg by mouth daily.  1  . JANUVIA 100 MG tablet TAKE 1 TABLET (100 MG TOTAL) BY MOUTH DAILY. 30 tablet 11  . PARoxetine (PAXIL) 30 MG tablet TAKE 1/2 TABLET BY MOUTH EVERY DAY 90 tablet 1  No current facility-administered medications on file prior to visit.      Review of Systems Pertinent items noted in HPI and remainder of comprehensive ROS otherwise negative.   Objective:   Blood pressure (!) 150/73, pulse (!) 101, weight 162 lb 5 oz (73.6 kg). General appearance: alert and no distress Abdomen: soft, non-tender; bowel sounds normal; no masses,  no organomegaly Pelvic: external genitalia with pallor and skin tears near the upper labia minora bilaterally and clitoral region with small area of possible agglutination.  Severe pallor of vaginal introitus and urethra.  Rectovaginal septum normal.  Vagina without discharge.  Uterus and cervix surgically absent. Bimanual exam not done.    Rectum: several large external hemorrhoids appreciated.  Extremities: extremities normal, atraumatic, no cyanosis or edema Neurologic: Grossly  normal   Assessment:   Vaginal atrophy (severe) Labial agglutination (mild) Hemorrhoids  Plan:   - Severe vaginal atrophy noted. Patient denies issues with internal vaginal region, is not sexually active. Advised on use of Premarin cream near labial tears and aggluttination, as well as urethral region daily x 2 weeks, then 3 times weekly after that.  - Hemorrhoids, patient reports self-treatment.  - RTC in 4-6 weeks to f/u vaginal atrophy.    Hildred Laserherry, Irvin Lizama, MD [facility]

## 2017-08-08 ENCOUNTER — Telehealth: Payer: Self-pay

## 2017-08-08 ENCOUNTER — Other Ambulatory Visit: Payer: Self-pay | Admitting: Internal Medicine

## 2017-08-08 DIAGNOSIS — F329 Major depressive disorder, single episode, unspecified: Secondary | ICD-10-CM

## 2017-08-08 DIAGNOSIS — F32A Depression, unspecified: Secondary | ICD-10-CM

## 2017-08-08 MED ORDER — PAROXETINE HCL 30 MG PO TABS: 30.0000 mg | ORAL_TABLET | Freq: Every day | ORAL | 1 refills | Status: DC

## 2017-08-08 NOTE — Telephone Encounter (Signed)
Patient called stating she needs refill on Paxil. She now takes a whole tablet and not half so she only has two days left of medication. Feels like works better for her as a whole tablet.  Please Advise.

## 2017-08-09 ENCOUNTER — Telehealth: Payer: Self-pay | Admitting: Obstetrics and Gynecology

## 2017-08-09 MED ORDER — ESTROGENS, CONJUGATED 0.625 MG/GM VA CREA: 0.2500 | TOPICAL_CREAM | VAGINAL | 12 refills | Status: DC

## 2017-08-09 NOTE — Telephone Encounter (Signed)
lmtrc

## 2017-08-09 NOTE — Telephone Encounter (Signed)
The patient called and stated that she was expecting Dr. Valentino Saxonherry to send in a prescription in (a Cream) the patient stated that it was noto called [place] to [place] [facility] [place] and also that she needs a nurse to call her as soon as possible. Please advise.

## 2017-08-09 NOTE — Telephone Encounter (Signed)
Patient called and is returning a missed called. Patient is requesting a call back. Please see below message. Thank you

## 2017-08-10 NOTE — Telephone Encounter (Signed)
Line busy

## 2017-08-12 NOTE — Telephone Encounter (Signed)
Pt has picked up her rx. She is aware of how to use it - per ac directions.

## 2017-08-31 ENCOUNTER — Ambulatory Visit: Payer: Medicare Other | Admitting: Internal Medicine

## 2017-08-31 ENCOUNTER — Encounter: Payer: Self-pay | Admitting: Internal Medicine

## 2017-08-31 VITALS — BP 142/78 | HR 100 | Ht 66.0 in | Wt 172.0 lb

## 2017-08-31 DIAGNOSIS — F324 Major depressive disorder, single episode, in partial remission: Secondary | ICD-10-CM | POA: Diagnosis not present

## 2017-08-31 DIAGNOSIS — E1100 Type 2 diabetes mellitus with hyperosmolarity without nonketotic hyperglycemic-hyperosmolar coma (NKHHC): Secondary | ICD-10-CM

## 2017-08-31 DIAGNOSIS — M25551 Pain in right hip: Secondary | ICD-10-CM

## 2017-08-31 DIAGNOSIS — L409 Psoriasis, unspecified: Secondary | ICD-10-CM

## 2017-08-31 DIAGNOSIS — E785 Hyperlipidemia, unspecified: Secondary | ICD-10-CM | POA: Diagnosis not present

## 2017-08-31 DIAGNOSIS — Z1231 Encounter for screening mammogram for malignant neoplasm of breast: Secondary | ICD-10-CM

## 2017-08-31 DIAGNOSIS — Z0001 Encounter for general adult medical examination with abnormal findings: Secondary | ICD-10-CM | POA: Diagnosis not present

## 2017-08-31 DIAGNOSIS — G8929 Other chronic pain: Secondary | ICD-10-CM

## 2017-08-31 DIAGNOSIS — Z0000 Encounter for general adult medical examination without abnormal findings: Secondary | ICD-10-CM

## 2017-08-31 DIAGNOSIS — E1169 Type 2 diabetes mellitus with other specified complication: Secondary | ICD-10-CM

## 2017-08-31 LAB — POCT URINALYSIS DIPSTICK
Bilirubin, UA: NEGATIVE
Blood, UA: 6
GLUCOSE UA: NEGATIVE
Ketones, UA: NEGATIVE
Leukocytes, UA: NEGATIVE
Nitrite, UA: NEGATIVE
Protein, UA: NEGATIVE
Urobilinogen, UA: 0.2 E.U./dL
pH, UA: 6 (ref 5.0–8.0)

## 2017-08-31 NOTE — Progress Notes (Signed)
Date:  08/31/2017   Name:  Jordan Montoya   DOB:  12-Jun-1934   MRN:  [id number]   Chief Complaint: Annual Exam (Breast Exam. ) Jordan Montoya is a 84 y.o. female who presents today for her Complete Annual Exam. She feels well. She reports exercising walking regularly. She reports she is sleeping fairly well. She sees GYN but needs a breast exam. Mammogram due this summer.  Diabetes  She presents for her follow-up diabetic visit. She has type 2 diabetes mellitus. Her disease course has been stable. Pertinent negatives for hypoglycemia include no dizziness or headaches. Pertinent negatives for diabetes include no chest pain, no fatigue and no polyuria. She is compliant with treatment all of the time. She monitors blood glucose at home 1-2 x per day. Her breakfast blood glucose is taken between 6-7 am. Her breakfast blood glucose range is generally 110-130 mg/dl. An ACE inhibitor/angiotensin II receptor blocker is being taken.  Depression         This is a chronic problem.  The problem occurs intermittently.  The problem has been gradually improving since onset.  Associated symptoms include restlessness.  Associated symptoms include no fatigue, no myalgias and no headaches.     The symptoms are aggravated by nothing.  Past treatments include SSRIs - Selective serotonin reuptake inhibitors and psychotherapy.  Compliance with treatment is good. Hip Pain   There was no injury mechanism. The pain is present in the right hip. The quality of the pain is described as aching. The pain is mild. The pain has been intermittent since onset. She has tried acetaminophen for the symptoms. The treatment provided moderate relief.  Psoriasis  This is a chronic problem. The problem is unchanged. The lesions are diffuse. Treatments tried: Cosentyx. The treatment provided significant relief. She has been using treatment for 1 to 2 years.      Review of Systems  Constitutional: Negative for chills,  fatigue, fever and unexpected weight change.  HENT: Positive for hearing loss. Negative for ear pain, sore throat and trouble swallowing.   Eyes: Negative for visual disturbance.  Respiratory: Positive for shortness of breath (with extreme exertion). Negative for cough, chest tightness and wheezing.   Cardiovascular: Negative for chest pain, palpitations and leg swelling.  Gastrointestinal: Negative for abdominal pain, diarrhea and nausea.  Endocrine: Negative for polyuria.  Genitourinary: Negative for dysuria, frequency and vaginal bleeding.       Started on premarin vaginal creme for atrophic vaginitis and dysuria - much improved  Musculoskeletal: Positive for arthralgias. Negative for myalgias and neck stiffness.  Skin: Negative for color change and rash.  Allergic/Immunologic: Negative for environmental allergies.  Neurological: Negative for dizziness and headaches.  Hematological: Negative for adenopathy. Does not bruise/bleed easily.  Psychiatric/Behavioral: Positive for depression and dysphoric mood (well controlled with paxil since increaseing to full dose). Negative for sleep disturbance.    Patient Active Problem List   Diagnosis Date Noted  . Type 2 diabetes mellitus with hyperosmolarity without coma, without long-term current use of insulin (HCC) 09/30/2015  . Environmental and seasonal allergies 05/30/2015  . Hip pain, chronic 01/27/2015  . 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 Avitaminosis D 11/25/2014  . Psoriasis 11/25/2014  . 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 Hyperlipidemia associated with type 2 diabetes mellitus (HCC) 08/20/2011    Prior to Admission medications   Medication Sig Start Date End Date Taking? Authorizing Provider  conjugated estrogens (PREMARIN) vaginal  cream Place 0.25 Applicatorfuls vaginally 3 (three) times a week. 08/10/17  Yes Hildred Laser, MD  COSENTYX SENSOREADY 300 DOSE 150  MG/ML SOAJ Inject 600 mg into the skin every 30 (thirty) days. 01/26/16  Yes [provider]  docusate sodium (STOOL SOFTENER) 250 MG capsule Take 250 mg by mouth daily.   Yes [provider]  fluticasone (FLONASE) 50 MCG/ACT nasal spray PLACE 2 SPRAYS INTO BOTH NOSTRILS DAILY. 03/02/17  Yes Reubin Milan, MD  glimepiride (AMARYL) 1 MG tablet TAKE 1 TABLET (1 MG TOTAL) BY MOUTH DAILY WITH BREAKFAST. 07/16/17  Yes Reubin Milan, MD  hydrOXYzine (ATARAX/VISTARIL) 10 MG tablet Take 10 mg by mouth daily. 09/20/16  Yes [provider]  JANUVIA 100 MG tablet TAKE 1 TABLET (100 MG TOTAL) BY MOUTH DAILY. 05/26/17  Yes Reubin Milan, MD  PARoxetine (PAXIL) 30 MG tablet Take 1 tablet (30 mg total) by mouth daily. 08/08/17  Yes Reubin Milan, MD    Allergies  Allergen Reactions  . Metformin Nausea Only    Past Surgical History:  Procedure Laterality Date  . ABDOMINAL HYSTERECTOMY    . BREAST BIOPSY Left    neg-bx/clip  . BREAST CYST EXCISION Left    neg  . EYE SURGERY Bilateral    cataracts  . PARTIAL HYSTERECTOMY     one ovary remains    Social History   Tobacco Use  . Smoking status: Former Smoker    Packs/day: 0.25    Years: 20.00    Pack years: 5.00    Types: Cigarettes  . Smokeless tobacco: Never Used  Substance Use Topics  . Alcohol use: No    Alcohol/week: 0.0 oz  . Drug use: No     Medication list has been reviewed and updated.  PHQ 2/9 Scores 06/16/2017 05/25/2017 01/29/2016 01/27/2015  PHQ - 2 Score 0 0 0 0  PHQ- 9 Score 1 0 - -    Physical Exam  Constitutional: She is oriented to person, place, and time. She appears well-developed and well-nourished. No distress.  HENT:  Head: Normocephalic and atraumatic.  Right Ear: Tympanic membrane and ear canal normal.  Left Ear: Tympanic membrane and ear canal normal.  Nose: Right sinus exhibits no maxillary sinus tenderness. Left sinus exhibits no maxillary sinus tenderness.    Mouth/Throat: Uvula is midline and oropharynx is clear and moist.  Eyes: Conjunctivae and EOM are normal. Right eye exhibits no discharge. Left eye exhibits no discharge. No scleral icterus.  Neck: Normal range of motion. Carotid bruit is not present. No erythema present. No thyromegaly present.  Cardiovascular: Normal rate, regular rhythm, normal heart sounds and normal pulses.  Pulmonary/Chest: Effort normal. No respiratory distress. She has no wheezes. Right breast exhibits no mass, no nipple discharge, no skin change and no tenderness. Left breast exhibits no mass, no nipple discharge, no skin change and no tenderness.  Abdominal: Soft. Bowel sounds are normal. There is no hepatosplenomegaly. There is no tenderness. There is no CVA tenderness.  Musculoskeletal: She exhibits no edema or tenderness.       Right hip: She exhibits decreased range of motion. She exhibits no bony tenderness and no swelling.  Lymphadenopathy:    She has no cervical adenopathy.    She has no axillary adenopathy.  Neurological: She is alert and oriented to person, place, and time. She has normal reflexes. No cranial nerve deficit or sensory deficit.  Skin: Skin is warm, dry and intact. No rash noted.  Psychiatric:  She has a normal mood and affect. Her speech is normal and behavior is normal. Thought content normal.  Nursing note and vitals reviewed.   BP (!) 142/78   Pulse 100   Ht 5\' 6"  (1.676 m)   Wt 172 lb (78 kg)   SpO2 95%   BMI 27.76 kg/m   Assessment and Plan: 1. Annual physical exam MAW completed Followed by GYN for gynecological issues  2. Encounter for screening mammogram for breast cancer Schedule in July - MM DIGITAL SCREENING BILATERAL; Future  3. Type 2 diabetes mellitus with hyperosmolarity without coma, without long-term current use of insulin (HCC) controlled - Comprehensive metabolic panel - Hemoglobin A1c - TSH - POCT urinalysis dipstick  4. Hyperlipidemia associated with type  2 diabetes mellitus (HCC) - Lipid panel  5. Psoriasis Doing well on cosentyx Followed by Dermatology - CBC with Differential/Platelet  6. Major depressive disorder with single episode, in partial remission (HCC) Controlled on Paxil - TSH  7. Chronic pain of right hip Continue walking as tolerated May need to consult Ortho   No orders of the defined types were placed in this encounter.   Partially dictated using Animal nutritionist. Any errors are unintentional.  Bari Edward, MD [facility]  08/31/2017

## 2017-08-31 NOTE — Patient Instructions (Addendum)
Complete vision exams are commended annually for all persons with diabetes.  Please make your eye doctor aware of your diabetes and request that your primary care provider be sent a copy of the office notes and findings after each visit.  Call to schedule your mammogram for this summer.  Breast Self-Awareness Breast self-awareness means being familiar with how your breasts look and feel. It involves checking your breasts regularly and reporting any changes to your health care provider. Practicing breast self-awareness is important. A change in your breasts can be a sign of a serious medical problem. Being familiar with how your breasts look and feel allows you to find any problems early, when treatment is more likely to be successful. All women should practice breast self-awareness, including women who have had breast implants. How to do a breast self-exam One way to learn what is normal for your breasts and whether your breasts are changing is to do a breast self-exam. To do a breast self-exam: Look for Changes  1. Remove all the clothing above your waist. 2. Stand in front of a mirror in a room with good lighting. 3. Put your hands on your hips. 4. Push your hands firmly downward. 5. Compare your breasts in the mirror. Look for differences between them (asymmetry), such as: ? Differences in shape. ? Differences in size. ? Puckers, dips, and bumps in one breast and not the other. 6. Look at each breast for changes in your skin, such as: ? Redness. ? Scaly areas. 7. Look for changes in your nipples, such as: ? Discharge. ? Bleeding. ? Dimpling. ? Redness. ? A change in position. Feel for Changes  Carefully feel your breasts for lumps and changes. It is best to do this while lying on your back on the floor and again while sitting or standing in the shower or tub with soapy water on your skin. Feel each breast in the following way:  Place the arm on the side of the breast you are  examining above your head.  Feel your breast with the other hand.  Start in the nipple area and make  inch (2 cm) overlapping circles to feel your breast. Use the pads of your three middle fingers to do this. Apply light pressure, then medium pressure, then firm pressure. The light pressure will allow you to feel the tissue closest to the skin. The medium pressure will allow you to feel the tissue that is a little deeper. The firm pressure will allow you to feel the tissue close to the ribs.  Continue the overlapping circles, moving downward over the breast until you feel your ribs below your breast.  Move one finger-width toward the center of the body. Continue to use the  inch (2 cm) overlapping circles to feel your breast as you move slowly up toward your collarbone.  Continue the up and down exam using all three pressures until you reach your armpit.  Write Down What You Find  Write down what is normal for each breast and any changes that you find. Keep a written record with breast changes or normal findings for each breast. By writing this information down, you do not need to depend only on memory for size, tenderness, or location. Write down where you are in your menstrual cycle, if you are still menstruating. If you are having trouble noticing differences in your breasts, do not get discouraged. With time you will become more familiar with the variations in your breasts and more comfortable  with the exam. How often should I examine my breasts? Examine your breasts every month. If you are breastfeeding, the best time to examine your breasts is after a feeding or after using a breast pump. If you menstruate, the best time to examine your breasts is 5-7 days after your period is over. During your period, your breasts are lumpier, and it may be more difficult to notice changes. When should I see my health care provider? See your health care provider if you notice:  A change in shape or size  of your breasts or nipples.  A change in the skin of your breast or nipples, such as a reddened or scaly area.  Unusual discharge from your nipples.  A lump or thick area that was not there before.  Pain in your breasts.  Anything that concerns you.  This information is not intended to replace advice given to you by your health care provider. Make sure you discuss any questions you have with your health care provider. Document Released: 07/19/2005 Document Revised: 12/25/2015 Document Reviewed: 06/08/2015 Elsevier Interactive Patient Education  [employer]2018 [employer] Inc[employer]

## 2017-09-01 LAB — COMPREHENSIVE METABOLIC PANEL
A/G RATIO: 1.2 (ref 1.2–2.2)
ALT: 11 IU/L (ref 0–32)
AST: 14 IU/L (ref 0–40)
Albumin: 4.1 g/dL (ref 3.5–4.7)
Alkaline Phosphatase: 93 IU/L (ref 39–117)
BUN/Creatinine Ratio: 21 (ref 12–28)
BUN: 16 mg/dL (ref 8–27)
Bilirubin Total: 0.2 mg/dL (ref 0.0–1.2)
CALCIUM: 9 mg/dL (ref 8.7–10.3)
CO2: 22 mmol/L (ref 20–29)
CREATININE: 0.77 mg/dL (ref 0.57–1.00)
Chloride: 103 mmol/L (ref 96–106)
GFR, EST AFRICAN AMERICAN: 84 mL/min/{1.73_m2} (ref >59)
GFR, EST NON AFRICAN AMERICAN: 73 mL/min/{1.73_m2} (ref >59)
GLOBULIN, TOTAL: 3.3 g/dL (ref 1.5–4.5)
Glucose: 210 mg/dL — ABNORMAL HIGH (ref 65–99)
POTASSIUM: 4.8 mmol/L (ref 3.5–5.2)
SODIUM: 139 mmol/L (ref 134–144)
TOTAL PROTEIN: 7.4 g/dL (ref 6.0–8.5)

## 2017-09-01 LAB — HEMOGLOBIN A1C
Est. average glucose Bld gHb Est-mCnc: 148 mg/dL
Hgb A1c MFr Bld: 6.8 % — ABNORMAL HIGH (ref 4.8–5.6)

## 2017-09-01 LAB — CBC WITH DIFFERENTIAL/PLATELET
BASOS: 0 %
Basophils Absolute: 0 10*3/uL (ref 0.0–0.2)
EOS (ABSOLUTE): 0.1 10*3/uL (ref 0.0–0.4)
EOS: 2 %
HEMATOCRIT: 33.9 % — AB (ref 34.0–46.6)
HEMOGLOBIN: 11 g/dL — AB (ref 11.1–15.9)
IMMATURE GRANULOCYTES: 0 %
Immature Grans (Abs): 0 10*3/uL (ref 0.0–0.1)
Lymphocytes Absolute: 1.7 10*3/uL (ref 0.7–3.1)
Lymphs: 34 %
MCH: 26.7 pg (ref 26.6–33.0)
MCHC: 32.4 g/dL (ref 31.5–35.7)
MCV: 82 fL (ref 79–97)
MONOCYTES: 8 %
MONOS ABS: 0.4 10*3/uL (ref 0.1–0.9)
NEUTROS PCT: 56 %
Neutrophils Absolute: 2.8 10*3/uL (ref 1.4–7.0)
Platelets: 287 10*3/uL (ref 150–379)
RBC: 4.12 x10E6/uL (ref 3.77–5.28)
RDW: 16.9 % — AB (ref 12.3–15.4)
WBC: 4.9 10*3/uL (ref 3.4–10.8)

## 2017-09-01 LAB — LIPID PANEL
Chol/HDL Ratio: 2.9 ratio (ref 0.0–4.4)
Cholesterol, Total: 166 mg/dL (ref 100–199)
HDL: 58 mg/dL (ref >39)
LDL Calculated: 64 mg/dL (ref 0–99)
Triglycerides: 218 mg/dL — ABNORMAL HIGH (ref 0–149)
VLDL CHOLESTEROL CAL: 44 mg/dL — AB (ref 5–40)

## 2017-09-01 LAB — TSH: TSH: 3.22 u[IU]/mL (ref 0.450–4.500)

## 2017-09-14 ENCOUNTER — Encounter: Payer: Self-pay | Admitting: Obstetrics and Gynecology

## 2017-09-14 ENCOUNTER — Ambulatory Visit (INDEPENDENT_AMBULATORY_CARE_PROVIDER_SITE_OTHER): Payer: Medicare Other | Admitting: Obstetrics and Gynecology

## 2017-09-14 VITALS — BP 151/65 | HR 85 | Wt 169.5 lb

## 2017-09-14 DIAGNOSIS — N952 Postmenopausal atrophic vaginitis: Secondary | ICD-10-CM

## 2017-09-14 NOTE — Progress Notes (Signed)
    GYNECOLOGY PROGRESS NOTE  Subjective:    Patient ID: Alben SpittleGretchen Johns Safranek, female    DOB: [birth year], 84 y.o.   MRN: [id number]  HPI  Patient is a 84 y.o. [id number] female who presents for 4-6 week f/u of vaginal atrophy and mild labial agglutination.  Patient was initiated on Premarin cream last visit, currently using 3 times weekly.  Patient notes that she is feeling a lot better.  Thinks the cream is working well.  Does note some occasional itching, but otherwise denies any major side effect.   The following portions of the patient's history were reviewed and updated as appropriate: allergies, current medications, past family history, past medical history, past social history and past surgical history.  Review of Systems Pertinent items noted in HPI and remainder of comprehensive ROS otherwise negative.   Objective:   Blood pressure (!) 151/65, pulse 85, weight 169 lb 8 oz (76.9 kg). General appearance: alert and no distress Pelvic: external genitalia with improvement in atrophy of labia minor with exception of near clitoral hood, there are still some patchy areas and leukoplakia. Speculum reveals improvement of pallor of vaginal mucosa.   Vagina without discharge.  Uterus and cervix surgically absent. Bimanual exam not done.     Assessment:   Vaginal atrophy  Plan:   Labial agglutination resolved. Vaginal atrophy improving with use of Premarin cream. Advised to continue use 3 x daily as prescribed. Patient inquires if she can use Vagisil for itching, can use, but if itching worsens, may need to change frequency cream or brand of cream. Can f/u yearly, unless symptoms worsen.    Hildred Laserherry, Shylie Polo, MD [facility]

## 2017-09-14 NOTE — Progress Notes (Signed)
Pt states that the Premarin is helping her.

## 2017-10-27 ENCOUNTER — Other Ambulatory Visit: Payer: Self-pay | Admitting: Internal Medicine

## 2017-11-03 LAB — HM DIABETES EYE EXAM

## 2017-11-05 ENCOUNTER — Encounter: Payer: Self-pay | Admitting: Internal Medicine

## 2017-12-29 ENCOUNTER — Ambulatory Visit: Payer: Medicare Other | Admitting: Internal Medicine

## 2017-12-29 ENCOUNTER — Encounter: Payer: Self-pay | Admitting: Internal Medicine

## 2017-12-29 VITALS — BP 134/78 | HR 89 | Temp 97.7°F | Resp 19 | Ht 66.0 in | Wt 168.0 lb

## 2017-12-29 DIAGNOSIS — E113299 Type 2 diabetes mellitus with mild nonproliferative diabetic retinopathy without macular edema, unspecified eye: Secondary | ICD-10-CM

## 2017-12-29 DIAGNOSIS — D510 Vitamin B12 deficiency anemia due to intrinsic factor deficiency: Secondary | ICD-10-CM

## 2017-12-29 DIAGNOSIS — E785 Hyperlipidemia, unspecified: Secondary | ICD-10-CM

## 2017-12-29 DIAGNOSIS — R0602 Shortness of breath: Secondary | ICD-10-CM | POA: Diagnosis not present

## 2017-12-29 DIAGNOSIS — E1169 Type 2 diabetes mellitus with other specified complication: Secondary | ICD-10-CM

## 2017-12-29 DIAGNOSIS — F324 Major depressive disorder, single episode, in partial remission: Secondary | ICD-10-CM

## 2017-12-29 LAB — POCT URINALYSIS DIPSTICK
Bilirubin, UA: NEGATIVE
Blood, UA: NEGATIVE
GLUCOSE UA: NEGATIVE
Ketones, UA: NEGATIVE
LEUKOCYTES UA: NEGATIVE
Nitrite, UA: NEGATIVE
Protein, UA: NEGATIVE
Urobilinogen, UA: 0.2 E.U./dL
pH, UA: 5 (ref 5.0–8.0)

## 2017-12-29 MED ORDER — ALPRAZOLAM 0.25 MG PO TABS: 0.2500 mg | ORAL_TABLET | Freq: Two times a day (BID) | ORAL | 0 refills | Status: DC | PRN

## 2017-12-29 NOTE — Progress Notes (Signed)
Date:  12/29/2017   Name:  Jordan Montoya   DOB:  1933-11-11   MRN:  [id number]   Chief Complaint: Diabetes; Shortness of Breath (1 month); and Fatigue Diabetes  She presents for her follow-up diabetic visit. She has type 2 diabetes mellitus. Her disease course has been fluctuating. Hypoglycemia symptoms include nervousness/anxiousness. Pertinent negatives for hypoglycemia include no dizziness or headaches. Associated symptoms include fatigue. Pertinent negatives for diabetes include no chest pain. Current diabetic treatment includes oral agent (dual therapy) (januvia and glimepiride). She is compliant with treatment all of the time. She participates in exercise three times a week. She monitors blood glucose at home 3-4 x per week. Her home blood glucose trend is fluctuating dramatically. An ACE inhibitor/angiotensin II receptor blocker is contraindicated.  Depression         This is a chronic problem.  The onset quality is undetermined. The problem is unchanged.  Associated symptoms include fatigue.  Associated symptoms include no headaches.  Past treatments include SSRIs - Selective serotonin reuptake inhibitors.  Compliance with treatment is good.  Previous treatment provided significant relief. Hyperlipidemia  This is a chronic problem. The problem is uncontrolled. Associated symptoms include shortness of breath. Pertinent negatives include no chest pain.  Shortness of Breath  This is a new problem. The current episode started 1 to 4 weeks ago. The problem occurs every several days. The problem has been unchanged. Pertinent negatives include no chest pain, fever, headaches, leg swelling, rash or wheezing. The symptoms are aggravated by exercise (and any prolonged activity).   Lab Results  Component Value Date   HGBA1C 6.8 (H) 08/31/2017   Lab Results  Component Value Date   CHOL 166 08/31/2017   HDL 58 08/31/2017   LDLCALC 64 08/31/2017   TRIG 218 (H) 08/31/2017   CHOLHDL 2.9  08/31/2017   Lab Results  Component Value Date   CREATININE 0.77 08/31/2017   BUN 16 08/31/2017   NA 139 08/31/2017   K 4.8 08/31/2017   CL 103 08/31/2017   CO2 22 08/31/2017      Review of Systems  Constitutional: Positive for fatigue. Negative for chills and fever.  Respiratory: Positive for shortness of breath. Negative for choking, chest tightness and wheezing.   Cardiovascular: Positive for palpitations. Negative for chest pain and leg swelling.  Genitourinary: Positive for frequency. Negative for dysuria.  Musculoskeletal: Positive for arthralgias and joint swelling.  Skin: Negative for rash.  Allergic/Immunologic: Negative for environmental allergies.  Neurological: Negative for dizziness and headaches.  Psychiatric/Behavioral: Positive for depression. Negative for sleep disturbance. The patient is nervous/anxious.     Patient Active Problem List   Diagnosis Date Noted  . Pernicious anemia 12/29/2017  . 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 Environmental and seasonal allergies 05/30/2015  . Hip pain, chronic 01/27/2015  . 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 Avitaminosis D 11/25/2014  . Psoriasis 11/25/2014  . Hyperlipidemia associated with type 2 diabetes mellitus (HCC) 08/20/2011    Prior to Admission medications   Medication Sig Start Date End Date Taking? Authorizing Provider  conjugated estrogens (PREMARIN) vaginal cream Place 0.25 Applicatorfuls vaginally 3 (three) times a week. 08/10/17   Hildred Laser, MD  COSENTYX SENSOREADY 300 DOSE 150 MG/ML SOAJ Inject 600 mg into the skin every 30 (thirty) days. 01/26/16   [provider]  docusate sodium (STOOL SOFTENER) 250 MG capsule Take 250 mg by mouth daily.  [provider]  fluticasone (FLONASE) 50 MCG/ACT nasal spray PLACE 2 SPRAYS INTO BOTH NOSTRILS DAILY. 03/02/17   Reubin Milan, MD  glimepiride (AMARYL) 1 MG  tablet TAKE 1 TABLET (1 MG TOTAL) BY MOUTH DAILY WITH BREAKFAST. 07/16/17   Reubin Milan, MD  hydrOXYzine (ATARAX/VISTARIL) 10 MG tablet Take 10 mg by mouth daily. 09/20/16   [provider]  JANUVIA 100 MG tablet TAKE 1 TABLET (100 MG TOTAL) BY MOUTH DAILY. 05/26/17   Reubin Milan, MD  PARoxetine (PAXIL) 30 MG tablet Take 1 tablet (30 mg total) by mouth daily. 08/08/17   Reubin Milan, MD    Allergies  Allergen Reactions  . Metformin Nausea Only    Past Surgical History:  Procedure Laterality Date  . ABDOMINAL HYSTERECTOMY    . BREAST BIOPSY Left    neg-bx/clip  . BREAST CYST EXCISION Left    neg  . EYE SURGERY Bilateral    cataracts  . PARTIAL HYSTERECTOMY     one ovary remains    Social History   Tobacco Use  . Smoking status: Former Smoker    Packs/day: 0.25    Years: 20.00    Pack years: 5.00    Types: Cigarettes  . Smokeless tobacco: Never Used  Substance Use Topics  . Alcohol use: No    Alcohol/week: 0.0 oz  . Drug use: No     Medication list has been reviewed and updated.  Current Meds  Medication Sig  . conjugated estrogens (PREMARIN) vaginal cream Place 0.25 Applicatorfuls vaginally 3 (three) times a week.  Lorenso Quarry SENSOREADY 300 DOSE 150 MG/ML SOAJ Inject 600 mg into the skin every 30 (thirty) days.  Marland Kitchen docusate sodium (STOOL SOFTENER) 250 MG capsule Take 250 mg by mouth daily.  . fluticasone (FLONASE) 50 MCG/ACT nasal spray PLACE 2 SPRAYS INTO BOTH NOSTRILS DAILY.  Marland Kitchen glimepiride (AMARYL) 1 MG tablet TAKE 1 TABLET (1 MG TOTAL) BY MOUTH DAILY WITH BREAKFAST.  . hydrOXYzine (ATARAX/VISTARIL) 10 MG tablet Take 10 mg by mouth daily.  Marland Kitchen JANUVIA 100 MG tablet TAKE 1 TABLET (100 MG TOTAL) BY MOUTH DAILY.  Marland Kitchen PARoxetine (PAXIL) 30 MG tablet Take 1 tablet (30 mg total) by mouth daily.    PHQ 2/9 Scores 12/29/2017 06/16/2017 05/25/2017 01/29/2016  PHQ - 2 Score 0 0 0 0  PHQ- 9 Score - 1 0 -    Physical Exam  Constitutional: She is  oriented to person, place, and time. She appears well-developed. No distress.  HENT:  Head: Normocephalic and atraumatic.  Eyes: Pupils are equal, round, and reactive to light.  Neck: Normal range of motion. Neck supple.  Cardiovascular: Normal rate, regular rhythm and normal pulses. Exam reveals no gallop and no S4.  Murmur heard.  Systolic murmur is present with a grade of 1/6. Pulmonary/Chest: Effort normal and breath sounds normal. No respiratory distress (appears fatigued). She has no wheezes. She has no rhonchi.  Musculoskeletal: Normal range of motion.  Neurological: She is alert and oriented to person, place, and time.  Skin: Skin is warm and dry. No rash noted.  Psychiatric: She has a normal mood and affect. Her behavior is normal. Thought content normal.  Nursing note and vitals reviewed.  After 5 min walk- O2 sat 98%, BP 162/58, Pulse 89 BP (!) 162/58 (BP Location: Right Arm, Patient Position: Sitting, Cuff Size: Normal)   Pulse 89   Temp 97.7 F (36.5 C) (Oral)   Resp 19   Ht 5'  6" (1.676 m)   Wt 168 lb (76.2 kg)   SpO2 98%   BMI 27.12 kg/m   Assessment and Plan: 1. Shortness of breath Will get labs and place ZIO monitor to rule out cardiac arrhythmia - EKG 12-Lead - NSR @ 81; snl - CBC with Differential/Platelet  2. 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 Hyperlipidemia associated with type 2 diabetes mellitus (HCC) Not on statin due to age and LDL < 70  4. Major depressive disorder with single episode, in partial remission (HCC) Doing fair on Paxil Refilled xanax #20 - ALPRAZolam (XANAX) 0.25 MG tablet; Take 1 tablet (0.25 mg total) by mouth 2 (two) times daily as needed for anxiety.  Dispense: 20 tablet; Refill: 0  5. Pernicious anemia Check labs - Vitamin B12   Meds ordered this encounter  Medications  . ALPRAZolam (XANAX) 0.25  MG tablet    Sig: Take 1 tablet (0.25 mg total) by mouth 2 (two) times daily as needed for anxiety.    Dispense:  20 tablet    Refill:  0    Partially dictated using Animal nutritionist. Any errors are unintentional.  Bari Edward, MD [facility]  12/29/2017

## 2017-12-30 LAB — COMPREHENSIVE METABOLIC PANEL
ALK PHOS: 91 IU/L (ref 39–117)
ALT: 13 IU/L (ref 0–32)
AST: 14 IU/L (ref 0–40)
Albumin/Globulin Ratio: 1.4 (ref 1.2–2.2)
Albumin: 4.4 g/dL (ref 3.5–4.7)
BUN / CREAT RATIO: 27 (ref 12–28)
BUN: 21 mg/dL (ref 8–27)
CHLORIDE: 103 mmol/L (ref 96–106)
CO2: 21 mmol/L (ref 20–29)
Calcium: 9.1 mg/dL (ref 8.7–10.3)
Creatinine, Ser: 0.78 mg/dL (ref 0.57–1.00)
GFR calc Af Amer: 82 mL/min/{1.73_m2} (ref >59)
GFR calc non Af Amer: 71 mL/min/{1.73_m2} (ref >59)
GLUCOSE: 129 mg/dL — AB (ref 65–99)
Globulin, Total: 3.1 g/dL (ref 1.5–4.5)
Potassium: 4.7 mmol/L (ref 3.5–5.2)
Sodium: 140 mmol/L (ref 134–144)
Total Protein: 7.5 g/dL (ref 6.0–8.5)

## 2017-12-30 LAB — CBC WITH DIFFERENTIAL/PLATELET
BASOS ABS: 0 10*3/uL (ref 0.0–0.2)
Basos: 0 %
EOS (ABSOLUTE): 0.1 10*3/uL (ref 0.0–0.4)
Eos: 2 %
Hematocrit: 33.5 % — ABNORMAL LOW (ref 34.0–46.6)
Hemoglobin: 10.5 g/dL — ABNORMAL LOW (ref 11.1–15.9)
Immature Grans (Abs): 0 10*3/uL (ref 0.0–0.1)
Immature Granulocytes: 0 %
LYMPHS ABS: 1.8 10*3/uL (ref 0.7–3.1)
Lymphs: 31 %
MCH: 25.7 pg — AB (ref 26.6–33.0)
MCHC: 31.3 g/dL — AB (ref 31.5–35.7)
MCV: 82 fL (ref 79–97)
Monocytes Absolute: 0.5 10*3/uL (ref 0.1–0.9)
Monocytes: 8 %
NEUTROS ABS: 3.5 10*3/uL (ref 1.4–7.0)
Neutrophils: 59 %
PLATELETS: 279 10*3/uL (ref 150–450)
RBC: 4.08 x10E6/uL (ref 3.77–5.28)
RDW: 16 % — AB (ref 12.3–15.4)
WBC: 5.9 10*3/uL (ref 3.4–10.8)

## 2017-12-30 LAB — TSH: TSH: 3.38 u[IU]/mL (ref 0.450–4.500)

## 2017-12-30 LAB — HEMOGLOBIN A1C
Est. average glucose Bld gHb Est-mCnc: 151 mg/dL
HEMOGLOBIN A1C: 6.9 % — AB (ref 4.8–5.6)

## 2017-12-30 LAB — VITAMIN B12: VITAMIN B 12: 161 pg/mL — AB (ref 232–1245)

## 2018-01-12 ENCOUNTER — Other Ambulatory Visit: Payer: Self-pay | Admitting: Internal Medicine

## 2018-01-12 DIAGNOSIS — E1100 Type 2 diabetes mellitus with hyperosmolarity without nonketotic hyperglycemic-hyperosmolar coma (NKHHC): Secondary | ICD-10-CM

## 2018-01-12 MED ORDER — GLIMEPIRIDE 1 MG PO TABS: 1.0000 mg | ORAL_TABLET | Freq: Every day | ORAL | 5 refills | Status: DC

## 2018-01-19 ENCOUNTER — Telehealth: Payer: Self-pay | Admitting: Internal Medicine

## 2018-01-19 ENCOUNTER — Other Ambulatory Visit: Payer: Self-pay | Admitting: Internal Medicine

## 2018-01-19 DIAGNOSIS — I471 Supraventricular tachycardia: Secondary | ICD-10-CM

## 2018-01-19 DIAGNOSIS — R0602 Shortness of breath: Secondary | ICD-10-CM

## 2018-01-19 NOTE — Telephone Encounter (Signed)
Please call the patient and let her know that the heart monitor showed episodes of rapid heart rate, lasting only a short while.  These could be related to her shortness of breath and she needs to see a cardiologist for further recommendations.  I have referred her to [facility].

## 2018-01-19 NOTE — Telephone Encounter (Signed)
Advised and gave appt time and date 6/25 at 10:15 B[facility]

## 2018-01-25 ENCOUNTER — Other Ambulatory Visit: Payer: Self-pay

## 2018-01-25 DIAGNOSIS — I499 Cardiac arrhythmia, unspecified: Secondary | ICD-10-CM

## 2018-01-30 ENCOUNTER — Telehealth: Payer: Self-pay

## 2018-01-30 NOTE — Telephone Encounter (Signed)
Called to report that she is doing pretty good now and wanted us to know she did see Gwen PoundsKowalski and started her on Metoprolol 25 mg but she is having some symptoms of dizziness and feeling very slow. Advised call cardio and let them know.

## 2018-01-30 NOTE — Telephone Encounter (Signed)
Agree that she needs to see Cardiology.

## 2018-02-01 ENCOUNTER — Other Ambulatory Visit: Payer: Self-pay | Admitting: Internal Medicine

## 2018-02-01 DIAGNOSIS — F32A Depression, unspecified: Secondary | ICD-10-CM

## 2018-02-01 DIAGNOSIS — F329 Major depressive disorder, single episode, unspecified: Secondary | ICD-10-CM

## 2018-02-23 ENCOUNTER — Ambulatory Visit
Admission: RE | Admit: 2018-02-23 | Discharge: 2018-02-23 | Disposition: A | Discharge status: Home / Self Care | Payer: Medicare Other | Source: Ambulatory Visit | Attending: Internal Medicine | Admitting: Internal Medicine

## 2018-02-23 DIAGNOSIS — Z1231 Encounter for screening mammogram for malignant neoplasm of breast: Secondary | ICD-10-CM | POA: Insufficient documentation

## 2018-03-15 ENCOUNTER — Other Ambulatory Visit: Payer: Self-pay | Admitting: Internal Medicine

## 2018-05-18 ENCOUNTER — Encounter: Payer: Self-pay | Admitting: Internal Medicine

## 2018-05-18 ENCOUNTER — Other Ambulatory Visit: Payer: Self-pay | Admitting: Internal Medicine

## 2018-05-18 ENCOUNTER — Ambulatory Visit: Payer: Medicare Other | Admitting: Internal Medicine

## 2018-05-18 VITALS — BP 136/68 | HR 69 | Ht 66.0 in | Wt 173.0 lb

## 2018-05-18 DIAGNOSIS — F324 Major depressive disorder, single episode, in partial remission: Secondary | ICD-10-CM

## 2018-05-18 DIAGNOSIS — D510 Vitamin B12 deficiency anemia due to intrinsic factor deficiency: Secondary | ICD-10-CM

## 2018-05-18 DIAGNOSIS — E1169 Type 2 diabetes mellitus with other specified complication: Secondary | ICD-10-CM

## 2018-05-18 DIAGNOSIS — E113299 Type 2 diabetes mellitus with mild nonproliferative diabetic retinopathy without macular edema, unspecified eye: Secondary | ICD-10-CM

## 2018-05-18 DIAGNOSIS — M2550 Pain in unspecified joint: Secondary | ICD-10-CM | POA: Diagnosis not present

## 2018-05-18 DIAGNOSIS — E785 Hyperlipidemia, unspecified: Secondary | ICD-10-CM

## 2018-05-18 NOTE — Progress Notes (Signed)
Date:  05/18/2018   Name:  Jordan Montoya   DOB:  [birth year]   MRN:  [id number]   Chief Complaint: Diabetes (A1C check)  Diabetes  She presents for her follow-up diabetic visit. She has type 2 diabetes mellitus. Hypoglycemia symptoms include nervousness/anxiousness. Pertinent negatives for hypoglycemia include no dizziness, headaches or tremors. Pertinent negatives for diabetes include no chest pain, no fatigue, no polydipsia and no polyuria. She monitors blood glucose at home 1-2 x per day. Her breakfast blood glucose is taken between 7-8 am. Her breakfast blood glucose range is generally 130-140 mg/dl.  Tachycardia/elevated BP - she was seen by Cardiology.  Stress test was good.  She was started on low dose beta blocker to help BP and heart rate.  She is tolerating it well. Arthralgias - she is limited by chronic diffuse joint pain.  Most of the pain in the knees, hips and hands.  She has psoriasis and maybe psoriatic arthritis.  She does not take any regular doses of tylenol etc. Depression -she has depression and anxiety.  She continues on Paxil with fairly good sx control.  Recently her 82 y/o son moved in and that has been a disruption in her routine. PA - last visit she has a low B12 level and mild anemia.  She has started on daily B12 supplements.  Lab Results  Component Value Date   HGBA1C 6.9 (H) 12/29/2017   Lab Results  Component Value Date   CHOL 166 08/31/2017   HDL 58 08/31/2017   LDLCALC 64 08/31/2017   TRIG 218 (H) 08/31/2017   CHOLHDL 2.9 08/31/2017     Review of Systems  Constitutional: Negative for appetite change, fatigue, fever and unexpected weight change.  HENT: Negative for tinnitus and trouble swallowing.   Eyes: Negative for visual disturbance.  Respiratory: Negative for cough, chest tightness and shortness of breath.   Cardiovascular: Negative for chest pain, palpitations and leg swelling.  Gastrointestinal: Negative for abdominal pain.    Endocrine: Negative for polydipsia and polyuria.  Genitourinary: Negative for dysuria and hematuria.  Musculoskeletal: Positive for arthralgias, gait problem and joint swelling.  Neurological: Negative for dizziness, tremors, numbness and headaches.  Psychiatric/Behavioral: Negative for dysphoric mood, sleep disturbance and suicidal ideas. The patient is nervous/anxious.     Patient Active Problem List   Diagnosis Date Noted  . Pernicious anemia 12/29/2017  . 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 Environmental and seasonal allergies 05/30/2015  . Hip pain, chronic 01/27/2015  . 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 Avitaminosis D 11/25/2014  . Psoriasis 11/25/2014  . Hyperlipidemia associated with type 2 diabetes mellitus (HCC) 08/20/2011    Allergies  Allergen Reactions  . Metformin Nausea Only    Past Surgical History:  Procedure Laterality Date  . ABDOMINAL HYSTERECTOMY    . BREAST BIOPSY Left    neg-bx/clip  . BREAST CYST EXCISION Left    neg  . EYE SURGERY Bilateral    cataracts  . PARTIAL HYSTERECTOMY     one ovary remains    Social History   Tobacco Use  . Smoking status: Former Smoker    Packs/day: 0.25    Years: 20.00    Pack years: 5.00    Types: Cigarettes  . Smokeless tobacco: Never Used  Substance Use Topics  . Alcohol use: No    Alcohol/week: 0.0 standard drinks  . Drug use: No     Medication  list has been reviewed and updated.  Current Meds  Medication Sig  . ALPRAZolam (XANAX) 0.25 MG tablet Take 1 tablet (0.25 mg total) by mouth 2 (two) times daily as needed for anxiety.  . conjugated estrogens (PREMARIN) vaginal cream Place 0.25 Applicatorfuls vaginally 3 (three) times a week.  Lorenso Quarry SENSOREADY 300 DOSE 150 MG/ML SOAJ Inject 600 mg into the skin every 30 (thirty) days.  Marland Kitchen docusate sodium (STOOL SOFTENER) 250 MG capsule Take 250 mg by mouth daily.  .  fluticasone (FLONASE) 50 MCG/ACT nasal spray PLACE 2 SPRAYS INTO BOTH NOSTRILS DAILY.  Marland Kitchen glimepiride (AMARYL) 1 MG tablet Take 1 tablet (1 mg total) by mouth daily with breakfast.  . JANUVIA 100 MG tablet TAKE 1 TABLET (100 MG TOTAL) BY MOUTH DAILY.  . meclizine (ANTIVERT) 12.5 MG tablet Take 12.5 mg by mouth 3 (three) times daily as needed for dizziness.  . metoprolol succinate (TOPROL-XL) 25 MG 24 hr tablet Take 12.5 mg by mouth daily.  Marland Kitchen PARoxetine (PAXIL) 30 MG tablet TAKE 1 TABLET BY MOUTH EVERY DAY    PHQ 2/9 Scores 05/18/2018 05/18/2018 12/29/2017 06/16/2017  PHQ - 2 Score 0 0 0 0  PHQ- 9 Score 2 0 - 1    Physical Exam  Constitutional: She is oriented to person, place, and time. She appears well-developed. No distress.  HENT:  Head: Normocephalic and atraumatic.  Neck: Normal range of motion. Neck supple.  Cardiovascular: Normal rate, regular rhythm and normal heart sounds.  Pulmonary/Chest: Effort normal and breath sounds normal. No respiratory distress.  Musculoskeletal: She exhibits no edema.  OA changes of MCP and DIP joints both hands OA changes of knees  Lymphadenopathy:    She has no cervical adenopathy.  Neurological: She is alert and oriented to person, place, and time.  Skin: Skin is warm and dry. No rash noted.  Psychiatric: She has a normal mood and affect. Her behavior is normal. Thought content normal.  Nursing note and vitals reviewed.   BP 136/68 (BP Location: Right Arm, Patient Position: Sitting, Cuff Size: Normal)   Pulse 69   Ht 5\' 6"  (1.676 m)   Wt 173 lb (78.5 kg)   SpO2 95%   BMI 27.92 kg/m   Assessment and Plan: 1. 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 Major depressive disorder with single episode, in partial remission (HCC) Doing fairly well on Paxil  3. Hyperlipidemia associated with type 2 diabetes mellitus (HCC) Not on statin therapy  due to LDL at goal  4. Arthralgia of multiple sites Begin Tylenol tid  5. Pernicious anemia Recheck labs Continue B12 daily - CBC with Differential/Platelet - Vitamin B12   Partially dictated using Animal nutritionist. Any errors are unintentional.  Bari Edward, MD [facility]  05/18/2018

## 2018-05-18 NOTE — Patient Instructions (Signed)
Take Tylenol 325 - 500 mg   2 tabs three times a day

## 2018-05-19 LAB — CBC WITH DIFFERENTIAL/PLATELET
Basophils Absolute: 0 10*3/uL (ref 0.0–0.2)
Basos: 1 %
EOS (ABSOLUTE): 0.1 10*3/uL (ref 0.0–0.4)
EOS: 2 %
HEMATOCRIT: 31.1 % — AB (ref 34.0–46.6)
Hemoglobin: 9.6 g/dL — ABNORMAL LOW (ref 11.1–15.9)
IMMATURE GRANS (ABS): 0 10*3/uL (ref 0.0–0.1)
Immature Granulocytes: 0 %
LYMPHS: 25 %
Lymphocytes Absolute: 1.5 10*3/uL (ref 0.7–3.1)
MCH: 24.1 pg — AB (ref 26.6–33.0)
MCHC: 30.9 g/dL — ABNORMAL LOW (ref 31.5–35.7)
MCV: 78 fL — ABNORMAL LOW (ref 79–97)
Monocytes Absolute: 0.4 10*3/uL (ref 0.1–0.9)
Monocytes: 7 %
NEUTROS ABS: 4 10*3/uL (ref 1.4–7.0)
Neutrophils: 65 %
PLATELETS: 341 10*3/uL (ref 150–450)
RBC: 3.98 x10E6/uL (ref 3.77–5.28)
RDW: 15.1 % (ref 12.3–15.4)
WBC: 6.1 10*3/uL (ref 3.4–10.8)

## 2018-05-19 LAB — HEMOGLOBIN A1C
Est. average glucose Bld gHb Est-mCnc: 157 mg/dL
HEMOGLOBIN A1C: 7.1 % — AB (ref 4.8–5.6)

## 2018-05-19 LAB — VITAMIN B12: Vitamin B-12: 1861 pg/mL — ABNORMAL HIGH (ref 232–1245)

## 2018-05-22 LAB — MICROALBUMIN / CREATININE URINE RATIO
Creatinine, Urine: 191.6 mg/dL
MICROALBUM., U, RANDOM: 26.8 ug/mL
Microalb/Creat Ratio: 14 mg/g creat (ref 0.0–30.0)

## 2018-05-25 LAB — IRON,TIBC AND FERRITIN PANEL
Ferritin: 7 ng/mL — ABNORMAL LOW (ref 15–150)
IRON SATURATION: 5 % — AB (ref 15–55)
Iron: 24 ug/dL — ABNORMAL LOW (ref 27–139)
TIBC: 466 ug/dL — AB (ref 250–450)
UIBC: 442 ug/dL — ABNORMAL HIGH (ref 118–369)

## 2018-05-25 LAB — SPECIMEN STATUS REPORT

## 2018-06-19 ENCOUNTER — Ambulatory Visit: Payer: Self-pay

## 2018-07-03 ENCOUNTER — Ambulatory Visit: Payer: Medicare Other | Admitting: Internal Medicine

## 2018-07-03 ENCOUNTER — Encounter: Payer: Self-pay | Admitting: Internal Medicine

## 2018-07-03 VITALS — BP 132/64 | HR 69 | Temp 97.7°F | Ht 66.0 in | Wt 168.8 lb

## 2018-07-03 DIAGNOSIS — L03211 Cellulitis of face: Secondary | ICD-10-CM | POA: Diagnosis not present

## 2018-07-03 DIAGNOSIS — E113299 Type 2 diabetes mellitus with mild nonproliferative diabetic retinopathy without macular edema, unspecified eye: Secondary | ICD-10-CM | POA: Diagnosis not present

## 2018-07-03 MED ORDER — AMOXICILLIN-POT CLAVULANATE 875-125 MG PO TABS: 1.0000 | ORAL_TABLET | Freq: Two times a day (BID) | ORAL | 0 refills | Status: AC

## 2018-07-03 NOTE — Progress Notes (Signed)
Date:  07/03/2018   Name:  Jordan Montoya   DOB:  1934-02-03   MRN:  [id number]   Chief Complaint: Facial Swelling (Left side of face. Knot under eye. Started yesterday. Day before Holiday had crown fall out and seen dentist to get it put back in. Sinus issues for 3 weeks and now better. ) Facial swelling - had a crown replaced on the left upper jaw last week.  Yesterday noticed swelling over her left face.  She has also had some sinus sx.   Sinus Problem  This is a new problem. The current episode started yesterday. The problem has been gradually worsening since onset. There has been no fever. The pain is mild. Associated symptoms include headaches and sinus pressure. Pertinent negatives include no chills, shortness of breath or sore throat.    Review of Systems  Constitutional: Positive for fatigue. Negative for chills and fever.  HENT: Positive for facial swelling, postnasal drip and sinus pressure. Negative for sore throat, trouble swallowing and voice change.   Respiratory: Negative for chest tightness and shortness of breath.   Cardiovascular: Negative for chest pain.  Neurological: Positive for headaches.    Patient Active Problem List   Diagnosis Date Noted  . Arthralgia of multiple sites 05/18/2018  . Pernicious anemia 12/29/2017  . 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 Environmental and seasonal allergies 05/30/2015  . Hip pain, chronic 01/27/2015  . 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 Avitaminosis D 11/25/2014  . Psoriasis 11/25/2014  . Hyperlipidemia associated with type 2 diabetes mellitus (HCC) 08/20/2011    Allergies  Allergen Reactions  . Metformin Nausea Only    Past Surgical History:  Procedure Laterality Date  . ABDOMINAL HYSTERECTOMY    . BREAST BIOPSY Left    neg-bx/clip  . BREAST CYST EXCISION Left    neg  . EYE SURGERY Bilateral    cataracts  . PARTIAL  HYSTERECTOMY     one ovary remains    Social History   Tobacco Use  . Smoking status: Former Smoker    Packs/day: 0.25    Years: 20.00    Pack years: 5.00    Types: Cigarettes  . Smokeless tobacco: Never Used  Substance Use Topics  . Alcohol use: No    Alcohol/week: 0.0 standard drinks  . Drug use: No     Medication list has been reviewed and updated.  Current Meds  Medication Sig  . ALPRAZolam (XANAX) 0.25 MG tablet Take 1 tablet (0.25 mg total) by mouth 2 (two) times daily as needed for anxiety.  . conjugated estrogens (PREMARIN) vaginal cream Place 0.25 Applicatorfuls vaginally 3 (three) times a week.  Lorenso Quarry. COSENTYX SENSOREADY 300 DOSE 150 MG/ML SOAJ Inject 600 mg into the skin every 30 (thirty) days.  Marland Kitchen. docusate sodium (STOOL SOFTENER) 250 MG capsule Take 250 mg by mouth daily.  . fluticasone (FLONASE) 50 MCG/ACT nasal spray PLACE 2 SPRAYS INTO BOTH NOSTRILS DAILY.  Marland Kitchen. glimepiride (AMARYL) 1 MG tablet Take 1 tablet (1 mg total) by mouth daily with breakfast.  . hydrOXYzine (ATARAX/VISTARIL) 10 MG tablet Take 10 mg by mouth daily.  Marland Kitchen. JANUVIA 100 MG tablet TAKE 1 TABLET (100 MG TOTAL) BY MOUTH DAILY.  . meclizine (ANTIVERT) 12.5 MG tablet Take 12.5 mg by mouth 3 (three) times daily as needed for dizziness.  . metoprolol succinate (TOPROL-XL) 25 MG 24 hr tablet Take 12.5 mg by mouth daily.  .Marland Kitchen  PARoxetine (PAXIL) 30 MG tablet TAKE 1 TABLET BY MOUTH EVERY DAY    PHQ 2/9 Scores 05/18/2018 05/18/2018 12/29/2017 06/16/2017  PHQ - 2 Score 0 0 0 0  PHQ- 9 Score 2 0 - 1    Physical Exam  Constitutional: She is oriented to person, place, and time. She appears well-developed. No distress.  HENT:  Head: Normocephalic and atraumatic.  Right Ear: Tympanic membrane and ear canal normal.  Left Ear: Tympanic membrane and ear canal normal.  Nose:    Neck: Normal range of motion. Neck supple.  Cardiovascular: Normal rate, regular rhythm and normal heart sounds.  Pulmonary/Chest: Effort  normal and breath sounds normal. No respiratory distress.  Musculoskeletal: Normal range of motion.  Lymphadenopathy:    She has no cervical adenopathy.  Neurological: She is alert and oriented to person, place, and time.  Skin: Skin is warm and dry. No rash noted.  Psychiatric: She has a normal mood and affect. Her behavior is normal. Thought content normal.  Nursing note and vitals reviewed.   BP 132/64 (BP Location: Right Arm, Patient Position: Sitting, Cuff Size: Normal)   Pulse 69   Temp 97.7 F (36.5 C) (Oral)   Ht 5\' 6"  (1.676 m)   Wt 168 lb 12.8 oz (76.6 kg)   SpO2 96%   BMI 27.25 kg/m   Assessment and Plan: 1. Facial cellulitis - amoxicillin-clavulanate (AUGMENTIN) 875-125 MG tablet; Take 1 tablet by mouth 2 (two) times daily for 10 days.  Dispense: 20 tablet; Refill: 0 - CBC with Differential/Platelet  2. 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 Any errors are unintentional.  Bari Edward, MD [facility]  07/03/2018

## 2018-07-04 LAB — CBC WITH DIFFERENTIAL/PLATELET
BASOS ABS: 0 10*3/uL (ref 0.0–0.2)
Basos: 0 %
EOS (ABSOLUTE): 0.2 10*3/uL (ref 0.0–0.4)
Eos: 2 %
Hematocrit: 36.2 % (ref 34.0–46.6)
Hemoglobin: 12 g/dL (ref 11.1–15.9)
Immature Grans (Abs): 0 10*3/uL (ref 0.0–0.1)
Immature Granulocytes: 0 %
LYMPHS: 24 %
Lymphocytes Absolute: 1.6 10*3/uL (ref 0.7–3.1)
MCH: 27.2 pg (ref 26.6–33.0)
MCHC: 33.1 g/dL (ref 31.5–35.7)
MCV: 82 fL (ref 79–97)
Monocytes Absolute: 0.7 10*3/uL (ref 0.1–0.9)
Monocytes: 10 %
Neutrophils Absolute: 4.3 10*3/uL (ref 1.4–7.0)
Neutrophils: 64 %
Platelets: 275 10*3/uL (ref 150–450)
RBC: 4.41 x10E6/uL (ref 3.77–5.28)
RDW: 21.9 % — ABNORMAL HIGH (ref 12.3–15.4)
WBC: 6.7 10*3/uL (ref 3.4–10.8)

## 2018-07-07 ENCOUNTER — Ambulatory Visit: Payer: Medicare Other | Admitting: Internal Medicine

## 2018-07-07 ENCOUNTER — Encounter: Payer: Self-pay | Admitting: Internal Medicine

## 2018-07-07 VITALS — BP 120/78 | HR 64 | Temp 98.0°F | Ht 66.0 in | Wt 170.0 lb

## 2018-07-07 DIAGNOSIS — L03211 Cellulitis of face: Secondary | ICD-10-CM | POA: Diagnosis not present

## 2018-07-07 NOTE — Progress Notes (Signed)
Date:  07/07/2018   Name:  Jordan Montoya   DOB:  09-13-1933   MRN:  [id number]   Chief Complaint: Follow-up (cellulitis- swelling has gone down, feeling a little better.)  Here to follow up on facial cellulitis.  Treated with antibiotics for the past 4 days.  CBC was normal.  She is feeling improved, swelling is down.  No fever or chills.  Some sinus congestion but does not want to use flonase daily.  Review of Systems  Constitutional: Negative for chills, fatigue and fever.  HENT: Positive for facial swelling. Negative for trouble swallowing and voice change.   Respiratory: Negative for shortness of breath and wheezing.   Cardiovascular: Negative for chest pain and palpitations.  Neurological: Negative for dizziness and headaches.    Patient Active Problem List   Diagnosis Date Noted  . Arthralgia of multiple sites 05/18/2018  . Pernicious anemia 12/29/2017  . 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 Environmental and seasonal allergies 05/30/2015  . Hip pain, chronic 01/27/2015  . 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 Avitaminosis D 11/25/2014  . Psoriasis 11/25/2014  . Hyperlipidemia associated with type 2 diabetes mellitus (HCC) 08/20/2011    Allergies  Allergen Reactions  . Metformin Nausea Only    Past Surgical History:  Procedure Laterality Date  . ABDOMINAL HYSTERECTOMY    . BREAST BIOPSY Left    neg-bx/clip  . BREAST CYST EXCISION Left    neg  . EYE SURGERY Bilateral    cataracts  . PARTIAL HYSTERECTOMY     one ovary remains    Social History   Tobacco Use  . Smoking status: Former Smoker    Packs/day: 0.25    Years: 20.00    Pack years: 5.00    Types: Cigarettes  . Smokeless tobacco: Never Used  Substance Use Topics  . Alcohol use: No    Alcohol/week: 0.0 standard drinks  . Drug use: No     Medication list has been reviewed and updated.  Current Meds    Medication Sig  . amoxicillin-clavulanate (AUGMENTIN) 875-125 MG tablet Take 1 tablet by mouth 2 (two) times daily for 10 days.  Marland Kitchen conjugated estrogens (PREMARIN) vaginal cream Place 0.25 Applicatorfuls vaginally 3 (three) times a week.  Lorenso Quarry SENSOREADY 300 DOSE 150 MG/ML SOAJ Inject 600 mg into the skin every 30 (thirty) days.  Marland Kitchen docusate sodium (STOOL SOFTENER) 250 MG capsule Take 250 mg by mouth daily.  . fluticasone (FLONASE) 50 MCG/ACT nasal spray PLACE 2 SPRAYS INTO BOTH NOSTRILS DAILY.  Marland Kitchen glimepiride (AMARYL) 1 MG tablet Take 1 tablet (1 mg total) by mouth daily with breakfast.  . hydrOXYzine (ATARAX/VISTARIL) 10 MG tablet Take 10 mg by mouth daily.  Marland Kitchen JANUVIA 100 MG tablet TAKE 1 TABLET (100 MG TOTAL) BY MOUTH DAILY.  . meclizine (ANTIVERT) 12.5 MG tablet Take 12.5 mg by mouth 3 (three) times daily as needed for dizziness.  . metoprolol succinate (TOPROL-XL) 25 MG 24 hr tablet Take 12.5 mg by mouth daily.  Marland Kitchen PARoxetine (PAXIL) 30 MG tablet TAKE 1 TABLET BY MOUTH EVERY DAY    PHQ 2/9 Scores 05/18/2018 05/18/2018 12/29/2017 06/16/2017  PHQ - 2 Score 0 0 0 0  PHQ- 9 Score 2 0 - 1    Physical Exam  Constitutional: She is oriented to person, place, and time. She appears well-developed. No distress.  HENT:  Head: Normocephalic and atraumatic. Head is without  right periorbital erythema and without left periorbital erythema.  Nose: Right sinus exhibits no maxillary sinus tenderness and no frontal sinus tenderness. Left sinus exhibits maxillary sinus tenderness. Left sinus exhibits no frontal sinus tenderness.  Neck: Normal range of motion. Neck supple.  Cardiovascular: Normal rate, regular rhythm and normal heart sounds.  Pulmonary/Chest: Effort normal and breath sounds normal. No respiratory distress.  Musculoskeletal: Normal range of motion.  Lymphadenopathy:    She has no cervical adenopathy.  Neurological: She is alert and oriented to person, place, and time.  Skin: Skin is  warm and dry. No rash noted.  Psychiatric: She has a normal mood and affect. Her behavior is normal. Thought content normal.  Nursing note and vitals reviewed.   BP 120/78   Pulse 64   Temp 98 F (36.7 C)   Ht 5\' 6"  (1.676 m)   Wt 170 lb (77.1 kg)   SpO2 94%   BMI 27.44 kg/m   Assessment and Plan: 1. Facial cellulitis Improving with antibiotics - finish course Follow up if needed   Partially dictated using Dragon software. Any errors are unintentional.  Bari EdwardLaura Germany Chelf, MD [facility]  07/07/2018

## 2018-07-24 ENCOUNTER — Other Ambulatory Visit: Payer: Self-pay | Admitting: Internal Medicine

## 2018-07-24 DIAGNOSIS — F329 Major depressive disorder, single episode, unspecified: Secondary | ICD-10-CM

## 2018-07-24 DIAGNOSIS — F32A Depression, unspecified: Secondary | ICD-10-CM

## 2018-08-16 DIAGNOSIS — M19042 Primary osteoarthritis, left hand: Secondary | ICD-10-CM

## 2018-08-16 DIAGNOSIS — M19041 Primary osteoarthritis, right hand: Secondary | ICD-10-CM | POA: Insufficient documentation

## 2018-08-28 ENCOUNTER — Telehealth: Payer: Self-pay | Admitting: Internal Medicine

## 2018-08-28 NOTE — Telephone Encounter (Signed)
Called to schedule Medicare Annual Wellness Visit with the Nurse Health Advisor. No answer. Left message  If patient returns call, please note: their last AWV was on 06/16/17, please schedule AWV with [facility] any date AFTER 06/16/2018. Patient has a Physical appointment on 10/27/2018.  Please try to schedule AWV one week prior to physical, but not on same day. Thank you! For any questions please contact: Trixie Rude at [phone] or Skype [email or web address]

## 2018-09-14 ENCOUNTER — Encounter: Payer: Self-pay | Admitting: Obstetrics and Gynecology

## 2018-10-16 ENCOUNTER — Ambulatory Visit (INDEPENDENT_AMBULATORY_CARE_PROVIDER_SITE_OTHER): Payer: Medicare Other

## 2018-10-16 ENCOUNTER — Other Ambulatory Visit: Payer: Self-pay

## 2018-10-16 VITALS — BP 132/62 | HR 78 | Temp 98.0°F | Resp 16 | Ht 66.0 in | Wt 164.2 lb

## 2018-10-16 DIAGNOSIS — Z0000 Encounter for general adult medical examination without abnormal findings: Secondary | ICD-10-CM

## 2018-10-16 DIAGNOSIS — Z1231 Encounter for screening mammogram for malignant neoplasm of breast: Secondary | ICD-10-CM | POA: Diagnosis not present

## 2018-10-16 NOTE — Patient Instructions (Addendum)
Jordan Montoya , Thank you for taking time to come for your Medicare Wellness Visit. I appreciate your ongoing commitment to your health goals. Please review the following plan we discussed and let me know if I can assist you in the future.   Screening recommendations/referrals: Colonoscopy: done 2004. No longer required.  Mammogram: done 02/23/18. Please call [phone] to schedule your mammogram.  Bone Density: Please contact [facility] to reschedule for bone density screening.  Recommended yearly ophthalmology/optometry visit for glaucoma screening and checkup Recommended yearly dental visit for hygiene and checkup  Vaccinations: Influenza vaccine: done 05/04/18 Pneumococcal vaccine: done 06/16/17  Tdap vaccine: done 08/20/11  Advanced directives: Please bring a copy of your health care power of attorney and living will to the office at your convenience.  Conditions/risks identified: Recommend drinking 6-8 glasses of water per day.   Next appointment: 10/27/18  9:40 Dr. Judithann Graves    Preventive Care 38 Years and Older, Female Preventive care refers to lifestyle choices and visits with your health care provider that can promote health and wellness. What does preventive care include?  A yearly physical exam. This is also called an annual well check.  Dental exams once or twice a year.  Routine eye exams. Ask your health care provider how often you should have your eyes checked.  Personal lifestyle choices, including:  Daily care of your teeth and gums.  Regular physical activity.  Eating a healthy diet.  Avoiding tobacco and drug use.  Limiting alcohol use.  Practicing safe sex.  Taking low-dose aspirin every day.  Taking vitamin and mineral supplements as recommended by your health care provider. What happens during an annual well check? The services and screenings done by your health care provider during your annual well check will depend on your age, overall health,  lifestyle risk factors, and family history of disease. Counseling  Your health care provider may ask you questions about your:  Alcohol use.  Tobacco use.  Drug use.  Emotional well-being.  Home and relationship well-being.  Sexual activity.  Eating habits.  History of falls.  Memory and ability to understand (cognition).  Work and work Astronomer.  Reproductive health. Screening  You may have the following tests or measurements:  Height, weight, and BMI.  Blood pressure.  Lipid and cholesterol levels. These may be checked every 5 years, or more frequently if you are over 58 years old.  Skin check.  Lung cancer screening. You may have this screening every year starting at age 30 if you have a 30-pack-year history of smoking and currently smoke or have quit within the past 15 years.  Fecal occult blood test (FOBT) of the stool. You may have this test every year starting at age 44.  Flexible sigmoidoscopy or colonoscopy. You may have a sigmoidoscopy every 5 years or a colonoscopy every 10 years starting at age 26.  Hepatitis C blood test.  Hepatitis B blood test.  Sexually transmitted disease (STD) testing.  Diabetes screening. This is done by checking your blood sugar (glucose) after you have not eaten for a while (fasting). You may have this done every 1-3 years.  Bone density scan. This is done to screen for osteoporosis. You may have this done starting at age 58.  Mammogram. This may be done every 1-2 years. Talk to your health care provider about how often you should have regular mammograms. Talk with your health care provider about your test results, treatment options, and if necessary, the need for more tests.  Vaccines  Your health care provider may recommend certain vaccines, such as:  Influenza vaccine. This is recommended every year.  Tetanus, diphtheria, and acellular pertussis (Tdap, Td) vaccine. You may need a Td booster every 10 years.  Zoster  vaccine. You may need this after age 24.  Pneumococcal 13-valent conjugate (PCV13) vaccine. One dose is recommended after age 47.  Pneumococcal polysaccharide (PPSV23) vaccine. One dose is recommended after age 37. Talk to your health care provider about which screenings and vaccines you need and how often you need them. This information is not intended to replace advice given to you by your health care provider. Make sure you discuss any questions you have with your health care provider. Document Released: 08/15/2015 Document Revised: 04/07/2016 Document Reviewed: 05/20/2015 Elsevier Interactive Patient Education  2017 [place] Prevention in the Home Falls can cause injuries. They can happen to people of all ages. There are many things you can do to make your home safe and to help prevent falls. What can I do on the outside of my home?  Regularly fix the edges of walkways and driveways and fix any cracks.  Remove anything that might make you trip as you walk through a door, such as a raised step or threshold.  Trim any bushes or trees on the path to your home.  Use bright outdoor lighting.  Clear any walking paths of anything that might make someone trip, such as rocks or tools.  Regularly check to see if handrails are loose or broken. Make sure that both sides of any steps have handrails.  Any raised decks and porches should have guardrails on the edges.  Have any leaves, snow, or ice cleared regularly.  Use sand or salt on walking paths during winter.  Clean up any spills in your garage right away. This includes oil or grease spills. What can I do in the bathroom?  Use night lights.  Install grab bars by the toilet and in the tub and shower. Do not use towel bars as grab bars.  Use non-skid mats or decals in the tub or shower.  If you need to sit down in the shower, use a plastic, non-slip stool.  Keep the floor dry. Clean up any water that spills on the  floor as soon as it happens.  Remove soap buildup in the tub or shower regularly.  Attach bath mats securely with double-sided non-slip rug tape.  Do not have throw rugs and other things on the floor that can make you trip. What can I do in the bedroom?  Use night lights.  Make sure that you have a light by your bed that is easy to reach.  Do not use any sheets or blankets that are too big for your bed. They should not hang down onto the floor.  Have a firm chair that has side arms. You can use this for support while you get dressed.  Do not have throw rugs and other things on the floor that can make you trip. What can I do in the kitchen?  Clean up any spills right away.  Avoid walking on wet floors.  Keep items that you use a lot in easy-to-reach places.  If you need to reach something above you, use a strong step stool that has a grab bar.  Keep electrical cords out of the way.  Do not use floor polish or wax that makes floors slippery. If you must use wax, use non-skid floor wax.  Do not have throw rugs and other things on the floor that can make you trip. What can I do with my stairs?  Do not leave any items on the stairs.  Make sure that there are handrails on both sides of the stairs and use them. Fix handrails that are broken or loose. Make sure that handrails are as long as the stairways.  Check any carpeting to make sure that it is firmly attached to the stairs. Fix any carpet that is loose or worn.  Avoid having throw rugs at the top or bottom of the stairs. If you do have throw rugs, attach them to the floor with carpet tape.  Make sure that you have a light switch at the top of the stairs and the bottom of the stairs. If you do not have them, ask someone to add them for you. What else can I do to help prevent falls?  Wear shoes that:  Do not have high heels.  Have rubber bottoms.  Are comfortable and fit you well.  Are closed at the toe. Do not wear  sandals.  If you use a stepladder:  Make sure that it is fully opened. Do not climb a closed stepladder.  Make sure that both sides of the stepladder are locked into place.  Ask someone to hold it for you, if possible.  Clearly mark and make sure that you can see:  Any grab bars or handrails.  First and last steps.  Where the edge of each step is.  Use tools that help you move around (mobility aids) if they are needed. These include:  Canes.  Walkers.  Scooters.  Crutches.  Turn on the lights when you go into a dark area. Replace any light bulbs as soon as they burn out.  Set up your furniture so you have a clear path. Avoid moving your furniture around.  If any of your floors are uneven, fix them.  If there are any pets around you, be aware of where they are.  Review your medicines with your doctor. Some medicines can make you feel dizzy. This can increase your chance of falling. Ask your doctor what other things that you can do to help prevent falls. This information is not intended to replace advice given to you by your health care provider. Make sure you discuss any questions you have with your health care provider. Document Released: 05/15/2009 Document Revised: 12/25/2015 Document Reviewed: 08/23/2014 Elsevier Interactive Patient Education  2017 [employer].

## 2018-10-16 NOTE — Progress Notes (Signed)
Subjective:   Jordan Montoya is a 85 y.o. female who presents for Medicare Annual (Subsequent) preventive examination.  Review of Systems:   Cardiac Risk Factors include: advanced age (>77men, >62 women);diabetes mellitus;dyslipidemia     Objective:     Vitals: BP 132/62 (BP Location: Left Arm, Patient Position: Sitting, Cuff Size: Normal)   Pulse 78   Temp 98 F (36.7 C) (Oral)   Resp 16   Ht  (1.676 m)   Wt 164 lb 3.2 oz (74.5 kg)   SpO2 95%   BMI 26.50 kg/m   Body mass index is 26.5 kg/m.  Advanced Directives 10/16/2018 06/16/2017 01/29/2016 10/27/2015 09/30/2015 09/01/2015 01/27/2015  Does Patient Have a Medical Advance Directive? Yes Yes Yes Yes Yes Yes Yes  Type of Advance Directive Living will;Healthcare Power of [employer] Power of [place];Living will Healthcare Power of [place];Living will Healthcare Power of [place];Living will Healthcare Power of [place];Living will Healthcare Power of [place];Living will Healthcare Power of [place];Living will  Does patient want to make changes to medical advance directive? - - No - Patient declined - - - -  Copy of Healthcare Power of Attorney in Chart? No - copy requested No - copy requested Yes - - - -    Tobacco Social History   Tobacco Use  Smoking Status Former Smoker  . Packs/day: 0.25  . Years: 20.00  . Pack years: 5.00  . Types: Cigarettes  Smokeless Tobacco Never Used     Counseling given: Not Answered   Clinical Intake:  Pre-visit preparation completed: Yes  Pain : No/denies pain     BMI - recorded: 26.5 Nutritional Status: BMI 25 -29 Overweight Nutritional Risks: None Diabetes: Yes CBG done?: No Did pt. bring in CBG monitor from home?: No   Nutrition Risk Assessment:  Has the patient had any N/V/D within the last 2 months?  No  Does the patient have any non-healing wounds?  No  Has the patient had any unintentional weight loss or weight gain?  No   Diabetes:  Is the  patient diabetic?  Yes  If diabetic, was a CBG obtained today?  No  Did the patient bring in their glucometer from home?  No  How often do you monitor your CBG's? Daily fasting in am. .   Financial Strains and Diabetes Management:  Are you having any financial strains with the device, your supplies or your medication? No .  Does the patient want to be seen by Chronic Care Management for management of their diabetes?  No  Would the patient like to be referred to a Nutritionist or for Diabetic Management?  No   Diabetic Exams:  Diabetic Eye Exam: Completed 11/03/17.   Diabetic Foot Exam: Completed 05/18/18.    How often do you need to have someone help you when you read instructions, pamphlets, or other written materials from your doctor or pharmacy?: 1 - Never  Interpreter Needed?: No  Information entered by :: Reather Littler LPN  Past Medical History:  Diagnosis Date  . Allergy   . Anxiety   . Avitaminosis D 11/25/2014  . Environmental and seasonal allergies 05/30/2015  . HLD (hyperlipidemia) 08/20/2011   Overview:  LDL goal <100 given DM2   . 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 Psoriasis 11/25/2014   followed by Dermatology   . Type 2 diabetes mellitus with hyperosmolarity without coma, without long-term current use of insulin (HCC) 09/30/2015   Past Surgical  History:  Procedure Laterality Date  . ABDOMINAL HYSTERECTOMY    . BREAST BIOPSY Left    neg-bx/clip  . BREAST CYST EXCISION Left    neg  . EYE SURGERY Bilateral    cataracts  . PARTIAL HYSTERECTOMY     one ovary remains   Family History  Problem Relation Age of Onset  . Cancer Mother   . Heart disease Father   . Diabetes Son   . Breast cancer Neg Hx    Social History   Socioeconomic History  . Marital status: Widowed    Spouse name: Not on file  . Number of children: 2  . Years of education: Not on file  . Highest education level: 11th grade  Occupational  History  . Occupation: Retired  Engineer, production  . Financial resource strain: Not hard at all  . Food insecurity:    Worry: Never true    Inability: Never true  . Transportation needs:    Medical: No    Non-medical: No  Tobacco Use  . Smoking status: Former Smoker    Packs/day: 0.25    Years: 20.00    Pack years: 5.00    Types: Cigarettes  . Smokeless tobacco: Never Used  Substance and Sexual Activity  . Alcohol use: No    Alcohol/week: 0.0 standard drinks  . Drug use: No  . Sexual activity: Not Currently  Lifestyle  . Physical activity:    Days per week: 0 days    Minutes per session: 0 min  . Stress: Rather much  Relationships  . Social connections:    Talks on phone: More than three times a week    Gets together: Once a week    Attends religious service: More than 4 times per year    Active member of club or organization: No    Attends meetings of clubs or organizations: Never    Relationship status: Widowed  Other Topics Concern  . Not on file  Social History Narrative  . Not on file    Outpatient Encounter Medications as of 10/16/2018  Medication Sig  . acetaminophen (TYLENOL) 650 MG CR tablet One tablet as needed every 8-12 hours as needed for joint pain  . conjugated estrogens (PREMARIN) vaginal cream Place 0.25 Applicatorfuls vaginally 3 (three) times a week.  Lorenso Quarry SENSOREADY 300 DOSE 150 MG/ML SOAJ Inject 600 mg into the skin every 30 (thirty) days.  Marland Kitchen docusate sodium (STOOL SOFTENER) 250 MG capsule Take 250 mg by mouth daily.  . ferrous sulfate 325 (65 FE) MG tablet Take by mouth.  . fluticasone (FLONASE) 50 MCG/ACT nasal spray PLACE 2 SPRAYS INTO BOTH NOSTRILS DAILY.  . Garlic 1000 MG CAPS Take 1 capsule by mouth.  Marland Kitchen glimepiride (AMARYL) 1 MG tablet Take 1 tablet (1 mg total) by mouth daily with breakfast.  . JANUVIA 100 MG tablet TAKE 1 TABLET (100 MG TOTAL) BY MOUTH DAILY.  . meclizine (ANTIVERT) 12.5 MG tablet Take 12.5 mg by mouth 3 (three) times  daily as needed for dizziness.  . meloxicam (MOBIC) 7.5 MG tablet Take 1 tablet by mouth daily. Take with food  . metoprolol succinate (TOPROL-XL) 25 MG 24 hr tablet Take 12.5 mg by mouth daily.  Marland Kitchen PARoxetine (PAXIL) 30 MG tablet TAKE 1 TABLET BY MOUTH EVERY DAY  . Triamcinolone Acetonide (TRIAMCINOLONE 0.1 % CREAM : EUCERIN) CREA 1(ONE) APPLICATION(S) TOPICAL EVERY DAY FROM THE NECK DOWN FOR ITCHING  . vitamin B-12 (CYANOCOBALAMIN) 1000 MCG tablet Take by  mouth.  . [DISCONTINUED] Calcium Carbonate-Vitamin D (CALCIUM 600+D) 600-400 MG-UNIT tablet Take by mouth.  . ALPRAZolam (XANAX) 0.25 MG tablet Take 1 tablet (0.25 mg total) by mouth 2 (two) times daily as needed for anxiety. (Patient not taking: Reported on 07/07/2018)  . hydrOXYzine (ATARAX/VISTARIL) 10 MG tablet Take 10 mg by mouth daily.   No facility-administered encounter medications on file as of 10/16/2018.     Activities of Daily Living In your present state of health, do you have any difficulty performing the following activities: 10/16/2018 07/03/2018  Hearing? N N  Comment declines hearing aids -  Vision? N N  Comment wears glasses -  Difficulty concentrating or making decisions? Y N  Comment short term memory -  Walking or climbing stairs? N N  Dressing or bathing? N N  Doing errands, shopping? N N  Preparing Food and eating ? N -  Using the Toilet? N -  In the past six months, have you accidently leaked urine? Y -  Comment wears pad for protection -  Do you have problems with loss of bowel control? N -  Managing your Medications? N -  Managing your Finances? N -  Housekeeping or managing your Housekeeping? N -  Some recent data might be hidden    Patient Care Team: Reubin Milan, MD as PCP - General (Internal Medicine) Wanita Chamberlain, MD (Dermatology) Hildred Laser, MD as Referring Physician (Obstetrics and Gynecology) Estrella Deeds, MD as Referring Physician (Dermatology) Lamar Blinks, MD as  Consulting Physician (Cardiology) Dayna Barker, MD as Referring Physician (Rheumatology)    Assessment:   This is a routine wellness examination for Jezreel.  Exercise Activities and Dietary recommendations Current Exercise Habits: The patient does not participate in regular exercise at present, Exercise limited by: orthopedic condition(s)  Goals    . DIET - INCREASE WATER INTAKE     Recommend drinking 6-8 glasses of water per day    . Exercise 150 minutes per week (moderate activity)     Recommended to attend [employer]       Fall Risk Fall Risk  10/16/2018 12/29/2017 06/16/2017 05/25/2017 01/29/2016  Falls in the past year? 0 No No No No  Number falls in past yr: 0 - - - -  Injury with Fall? 0 - - - -  Follow up Falls prevention discussed - - - -   FALL RISK PREVENTION PERTAINING TO THE HOME:  Any stairs in or around the home? No  If so, do they handrails? No   Home free of loose throw rugs in walkways, pet beds, electrical cords, etc? Yes  Adequate lighting in your home to reduce risk of falls? Yes   ASSISTIVE DEVICES UTILIZED TO PREVENT FALLS:  Life alert? Yes  Use of a cane, walker or w/c? No  Grab bars in the bathroom? No  Shower chair or bench in shower? Yes  Elevated toilet seat or a handicapped toilet? No   DME ORDERS:  DME order needed?  No   TIMED UP AND GO:  Was the test performed? Yes .  Length of time to ambulate 10 feet: 6 sec.   GAIT:  Appearance of gait: Gait stead-fast and without the use of an assistive device.   Education: Fall risk prevention has been discussed.  Intervention(s) required? No   Depression Screen PHQ 2/9 Scores 10/16/2018 05/18/2018 05/18/2018 12/29/2017  PHQ - 2 Score 0 0 0 0  PHQ- 9 Score 6 2 0 -  Cognitive Function     6CIT Screen 06/16/2017  What Year? 0 points  What month? 0 points  What time? 0 points  Count back from 20 0 points  Months in reverse 0 points  Repeat phrase 4 points  Total  Score 4    Immunization History  Administered Date(s) Administered  . Influenza-Unspecified 05/04/2016, 05/24/2017, 05/04/2018  . Pneumococcal Conjugate-13 06/06/2014  . Pneumococcal Polysaccharide-23 06/16/2017  . Tdap 08/20/2011    Qualifies for Shingles Vaccine? No due to psoriasis medication dermatologist advised not to take shingles vaccine.    Tdap: Up to date  Flu Vaccine: Up to date  Pneumococcal Vaccine: Up to date   Screening Tests Health Maintenance  Topic Date Due  . OPHTHALMOLOGY EXAM  11/04/2018  . HEMOGLOBIN A1C  11/17/2018  . FOOT EXAM  05/19/2019  . URINE MICROALBUMIN  05/19/2019  . TETANUS/TDAP  08/19/2021  . INFLUENZA VACCINE  Completed  . DEXA SCAN  Completed  . PNA vac Low Risk Adult  Addressed    Cancer Screenings:  Colorectal Screening: No longer required.   Mammogram: Completed 02/23/18. Repeat every year;  Ordered today. Pt provided with contact information and advised to call to schedule appt.   Bone Density: Completed 2012. Pt scheduled to complete at [facility].   Lung Cancer Screening: (Low Dose CT Chest recommended if Age 85-80 years, 30 pack-year currently smoking OR have quit w/in 15years.) does not qualify.    Additional Screening:  Hepatitis C Screening: no longer required  Vision Screening: Recommended annual ophthalmology exams for early detection of glaucoma and other disorders of the eye. Is the patient up to date with their annual eye exam?  Yes  Who is the provider or what is the name of the office in which the pt attends annual eye exams? [facility]  Dental Screening: Recommended annual dental exams for proper oral hygiene  Community Resource Referral:  CRR required this visit?  No      Plan:    I have personally reviewed and addressed the Medicare Annual Wellness questionnaire and have noted the following in the patient's chart:  A. Medical and social history B. Use of alcohol, tobacco or illicit  drugs  C. Current medications and supplements D. Functional ability and status E.  Nutritional status F.  Physical activity G. Advance directives H. List of other physicians I.  Hospitalizations, surgeries, and ER visits in previous 12 months J.  Vitals K. Screenings such as hearing and vision if needed, cognitive and depression L. Referrals and appointments   In addition, I have reviewed and discussed with patient certain preventive protocols, quality metrics, and best practice recommendations. A written personalized care plan for preventive services as well as general preventive health recommendations were provided to patient.   Signed,  Reather Littler, LPN Nurse Health Advisor   Nurse Notes: pt c/o feeling anxious and nervous a lot of the time. She stated she wants to go off paxil because she's been on it for so many years and has nightmares when sleeping but is afraid to go off it it. She also c/o allergy symptoms and runny nose but doesn't like antihistamines. She has been feeling very tired but states she is taking iron and B12. Pt to follow up at medicare yearly on 10/27/18.

## 2018-10-20 ENCOUNTER — Other Ambulatory Visit: Payer: Self-pay

## 2018-10-20 DIAGNOSIS — E1100 Type 2 diabetes mellitus with hyperosmolarity without nonketotic hyperglycemic-hyperosmolar coma (NKHHC): Secondary | ICD-10-CM

## 2018-10-20 MED ORDER — GLIMEPIRIDE 1 MG PO TABS: 1.0000 mg | ORAL_TABLET | Freq: Every day | ORAL | 2 refills | Status: DC

## 2018-10-27 ENCOUNTER — Encounter: Payer: Self-pay | Admitting: Internal Medicine

## 2018-11-01 DIAGNOSIS — B029 Zoster without complications: Secondary | ICD-10-CM

## 2018-11-01 HISTORY — DX: Zoster without complications: B02.9

## 2018-11-16 ENCOUNTER — Ambulatory Visit (INDEPENDENT_AMBULATORY_CARE_PROVIDER_SITE_OTHER): Payer: Medicare Other | Admitting: Internal Medicine

## 2018-11-16 ENCOUNTER — Encounter: Payer: Self-pay | Admitting: Internal Medicine

## 2018-11-16 ENCOUNTER — Other Ambulatory Visit: Payer: Self-pay

## 2018-11-16 VITALS — Ht 66.0 in | Wt 164.0 lb

## 2018-11-16 DIAGNOSIS — F411 Generalized anxiety disorder: Secondary | ICD-10-CM | POA: Diagnosis not present

## 2018-11-16 DIAGNOSIS — F419 Anxiety disorder, unspecified: Secondary | ICD-10-CM | POA: Insufficient documentation

## 2018-11-16 DIAGNOSIS — N949 Unspecified condition associated with female genital organs and menstrual cycle: Secondary | ICD-10-CM

## 2018-11-16 DIAGNOSIS — F324 Major depressive disorder, single episode, in partial remission: Secondary | ICD-10-CM | POA: Diagnosis not present

## 2018-11-16 MED ORDER — ESTROGENS, CONJUGATED 0.625 MG/GM VA CREA: 0.2500 | TOPICAL_CREAM | VAGINAL | 12 refills | Status: DC

## 2018-11-16 MED ORDER — ALPRAZOLAM 0.25 MG PO TABS: 0.2500 mg | ORAL_TABLET | Freq: Every day | ORAL | 1 refills | Status: DC | PRN

## 2018-11-16 NOTE — Progress Notes (Signed)
Date:  11/16/2018   Name:  Jordan Montoya   DOB:  1933-10-01   MRN:  [id number]  I connected with this patient, Jordan Montoya, by telephone at the patient's home.  I verified that I am speaking with the correct person using two identifiers. This visit was conducted via telephone due to the Covid-19 outbreak from my office [facility] in [place], [place]. I discussed the limitations, risks, security and privacy concerns of performing an evaluation and management service by telephone. I also discussed with the patient that there may be a patient responsible charge related to this service. The patient expressed understanding and agreed to proceed.  Chief Complaint: Anxiety  Anxiety  Presents for initial visit. The problem has been gradually worsening. Symptoms include insomnia, irritability, nervous/anxious behavior and restlessness. Patient reports no chest pain, depressed mood, dizziness, dry mouth, excessive worry, feeling of choking, nausea, shortness of breath or suicidal ideas. Symptoms occur most days. The severity of symptoms is moderate.   There are no known risk factors. Past treatments include SSRIs and benzodiazephines.  Depression         This is a chronic problem.  The problem occurs every several days.  Associated symptoms include insomnia and restlessness.  Associated symptoms include no fatigue, no headaches and no suicidal ideas.  Past treatments include SSRIs - Selective serotonin reuptake inhibitors.  Compliance with treatment is good.  Previous treatment provided significant relief.  Past medical history includes anxiety.    Vaginitis - seen by GYN last year, given premarin vaginal cream with good benefit.  She is due for a refill but unable to see GYN due to the covid-19.  Review of Systems  Constitutional: Positive for irritability. Negative for chills, fatigue and fever.  Eyes: Negative for visual disturbance.  Respiratory: Negative for shortness of breath.    Cardiovascular: Negative for chest pain.  Gastrointestinal: Negative for nausea.  Allergic/Immunologic: Positive for environmental allergies.  Neurological: Negative for dizziness, weakness, light-headedness and headaches.  Psychiatric/Behavioral: Positive for depression. Negative for suicidal ideas. The patient is nervous/anxious and has insomnia.     Patient Active Problem List   Diagnosis Date Noted  . Primary osteoarthritis of both hands 08/16/2018  . Arthralgia of multiple sites 05/18/2018  . Pernicious anemia 12/29/2017  . 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 Environmental and seasonal allergies 05/30/2015  . Hip pain, chronic 01/27/2015  . 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 Avitaminosis D 11/25/2014  . Psoriasis 11/25/2014  . Hyperlipidemia associated with type 2 diabetes mellitus (HCC) 08/20/2011    Allergies  Allergen Reactions  . Metformin Nausea Only    Past Surgical History:  Procedure Laterality Date  . ABDOMINAL HYSTERECTOMY    . BREAST BIOPSY Left    neg-bx/clip  . BREAST CYST EXCISION Left    neg  . EYE SURGERY Bilateral    cataracts  . PARTIAL HYSTERECTOMY     one ovary remains    Social History   Tobacco Use  . Smoking status: Former Smoker    Packs/day: 0.25    Years: 20.00    Pack years: 5.00    Types: Cigarettes  . Smokeless tobacco: Never Used  Substance Use Topics  . Alcohol use: No    Alcohol/week: 0.0 standard drinks  . Drug use: No     Medication list has been reviewed and updated.  Current Meds  Medication Sig  . ALPRAZolam (  XANAX) 0.25 MG tablet Take 1 tablet (0.25 mg total) by mouth 2 (two) times daily as needed for anxiety.  . conjugated estrogens (PREMARIN) vaginal cream Place 0.25 Applicatorfuls vaginally 3 (three) times a week.  Jordan Montoya SENSOREADY 300 DOSE 150 MG/ML SOAJ Inject 600 mg into the skin every 30 (thirty) days.  Marland Kitchen docusate  sodium (STOOL SOFTENER) 250 MG capsule Take 250 mg by mouth daily.  . ferrous sulfate 325 (65 FE) MG tablet Take by mouth.  . fluticasone (FLONASE) 50 MCG/ACT nasal spray PLACE 2 SPRAYS INTO BOTH NOSTRILS DAILY.  . Garlic 1000 MG CAPS Take 1 capsule by mouth.  Marland Kitchen glimepiride (AMARYL) 1 MG tablet Take 1 tablet (1 mg total) by mouth daily with breakfast.  . hydrOXYzine (ATARAX/VISTARIL) 10 MG tablet Take 10 mg by mouth daily.  Marland Kitchen JANUVIA 100 MG tablet TAKE 1 TABLET (100 MG TOTAL) BY MOUTH DAILY.  . meclizine (ANTIVERT) 12.5 MG tablet Take 12.5 mg by mouth 3 (three) times daily as needed for dizziness.  . meloxicam (MOBIC) 7.5 MG tablet Take 1 tablet by mouth daily. Take with food  . metoprolol succinate (TOPROL-XL) 25 MG 24 hr tablet Take 12.5 mg by mouth daily.  Marland Kitchen PARoxetine (PAXIL) 30 MG tablet TAKE 1 TABLET BY MOUTH EVERY DAY  . Triamcinolone Acetonide (TRIAMCINOLONE 0.1 % CREAM : EUCERIN) CREA 1(ONE) APPLICATION(S) TOPICAL EVERY DAY FROM THE NECK DOWN FOR ITCHING  . vitamin B-12 (CYANOCOBALAMIN) 1000 MCG tablet Take by mouth.    PHQ 2/9 Scores 11/16/2018 10/16/2018 05/18/2018 05/18/2018  PHQ - 2 Score 1 0 0 0  PHQ- 9 Score 6 6 2  0   GAD 7 : Generalized Anxiety Score 11/16/2018  Nervous, Anxious, on Edge 3  Control/stop worrying 0  Worry too much - different things 0  Trouble relaxing 3  Restless 3  Easily annoyed or irritable 0  Afraid - awful might happen 0  Total GAD 7 Score 9      BP Readings from Last 3 Encounters:  10/16/18 132/62  07/07/18 120/78  07/03/18 132/64    Physical Exam Pulmonary:     Comments: Voice normal, no evidence of dyspnea on the call Neurological:     Mental Status: She is alert.  Psychiatric:        Attention and Perception: Attention normal.        Mood and Affect: Mood is anxious. Mood is not depressed.        Speech: Speech normal.        Thought Content: Thought content normal. Thought content does not include suicidal plan.        Cognition  and Memory: Cognition normal.        Judgment: Judgment normal.     Wt Readings from Last 3 Encounters:  11/16/18 164 lb (74.4 kg)  10/16/18 164 lb 3.2 oz (74.5 kg)  07/07/18 170 lb (77.1 kg)    Ht 5\' 6"  (1.676 m)   Wt 164 lb (74.4 kg)   BMI 26.47 kg/m   Assessment and Plan: 1. Major depressive disorder with single episode, in partial remission (HCC) Continue Paxil at current dose  2. Generalized anxiety disorder Will prescribe low dose xanax which pt has taken for years Follow up if sx persist - consider wellbutrin or buspar - ALPRAZolam (XANAX) 0.25 MG tablet; Take 1 tablet (0.25 mg total) by mouth daily as needed for anxiety.  Dispense: 30 tablet; Refill: 1  3. Pain of female genitalia Refill estrogen cream - conjugated  estrogens (PREMARIN) vaginal cream; Place 0.25 Applicatorfuls vaginally 3 (three) times a week.  Dispense: 42.5 g; Refill: 12  I spent 16 minutes on this encounter. Partially dictated using Animal nutritionist. Any errors are unintentional.  Bari Edward, MD [facility]  11/16/2018

## 2018-12-04 ENCOUNTER — Telehealth: Payer: Self-pay

## 2018-12-04 NOTE — Telephone Encounter (Signed)
Patient said since Friday- 12/01/2018 she has not felt well. She said she started vomiting and had very serious LLQ pain for 2 days. Hurt so bad that she could not sleep. Said it wrapped around into her back. Never had this before. She feels better today, pain is gone and no vomiting.  Told her to drink gatorade and rest today. If sxs come back told her to call the office and schedule an appt.

## 2018-12-05 ENCOUNTER — Emergency Department: Payer: Medicare Other

## 2018-12-05 ENCOUNTER — Encounter: Payer: Self-pay | Admitting: Emergency Medicine

## 2018-12-05 ENCOUNTER — Emergency Department
Admission: EM | Admit: 2018-12-05 | Discharge: 2018-12-05 | Disposition: A | Discharge status: Home / Self Care | Payer: Medicare Other | Attending: Emergency Medicine | Admitting: Emergency Medicine

## 2018-12-05 ENCOUNTER — Other Ambulatory Visit: Payer: Self-pay

## 2018-12-05 ENCOUNTER — Encounter: Payer: Self-pay | Admitting: Internal Medicine

## 2018-12-05 ENCOUNTER — Ambulatory Visit: Payer: Medicare Other | Admitting: Internal Medicine

## 2018-12-05 VITALS — BP 144/58 | HR 92 | Temp 98.6°F | Ht 66.0 in | Wt 163.0 lb

## 2018-12-05 DIAGNOSIS — R509 Fever, unspecified: Secondary | ICD-10-CM | POA: Insufficient documentation

## 2018-12-05 DIAGNOSIS — E119 Type 2 diabetes mellitus without complications: Secondary | ICD-10-CM | POA: Insufficient documentation

## 2018-12-05 DIAGNOSIS — R1032 Left lower quadrant pain: Secondary | ICD-10-CM | POA: Diagnosis present

## 2018-12-05 DIAGNOSIS — Z87891 Personal history of nicotine dependence: Secondary | ICD-10-CM | POA: Diagnosis not present

## 2018-12-05 DIAGNOSIS — Z79899 Other long term (current) drug therapy: Secondary | ICD-10-CM | POA: Diagnosis not present

## 2018-12-05 DIAGNOSIS — Z7984 Long term (current) use of oral hypoglycemic drugs: Secondary | ICD-10-CM | POA: Insufficient documentation

## 2018-12-05 LAB — COMPREHENSIVE METABOLIC PANEL
ALT: 16 U/L (ref 0–44)
AST: 20 U/L (ref 15–41)
Albumin: 4.3 g/dL (ref 3.5–5.0)
Alkaline Phosphatase: 70 U/L (ref 38–126)
Anion gap: 12 (ref 5–15)
BUN: 21 mg/dL (ref 8–23)
CO2: 20 mmol/L — ABNORMAL LOW (ref 22–32)
Calcium: 8.9 mg/dL (ref 8.9–10.3)
Chloride: 105 mmol/L (ref 98–111)
Creatinine, Ser: 0.8 mg/dL (ref 0.44–1.00)
GFR calc Af Amer: >60 mL/min (ref >60)
GFR calc non Af Amer: >60 mL/min (ref >60)
Glucose, Bld: 113 mg/dL — ABNORMAL HIGH (ref 70–99)
Potassium: 3.6 mmol/L (ref 3.5–5.1)
Sodium: 137 mmol/L (ref 135–145)
Total Bilirubin: 1.2 mg/dL (ref 0.3–1.2)
Total Protein: 7.8 g/dL (ref 6.5–8.1)

## 2018-12-05 LAB — POC URINALYSIS WITH MICROSCOPIC (NON AUTO)MANUAL RESULT
Bilirubin, UA: NEGATIVE
Crystals: 0
Glucose, UA: NEGATIVE
Leukocytes, UA: NEGATIVE
Mucus, UA: 0
Nitrite, UA: NEGATIVE
Protein, UA: POSITIVE — AB
RBC: 5 M/uL (ref 4.04–5.48)
Spec Grav, UA: >=1.03 — AB (ref 1.010–1.025)
Urobilinogen, UA: 0.2 E.U./dL
pH, UA: 5 (ref 5.0–8.0)

## 2018-12-05 LAB — CBC
HCT: 40.1 % (ref 36.0–46.0)
Hemoglobin: 13.8 g/dL (ref 12.0–15.0)
MCH: 31.7 pg (ref 26.0–34.0)
MCHC: 34.4 g/dL (ref 30.0–36.0)
MCV: 92 fL (ref 80.0–100.0)
Platelets: 196 10*3/uL (ref 150–400)
RBC: 4.36 MIL/uL (ref 3.87–5.11)
RDW: 12.7 % (ref 11.5–15.5)
WBC: 6.9 10*3/uL (ref 4.0–10.5)
nRBC: 0 % (ref 0.0–0.2)

## 2018-12-05 LAB — LIPASE, BLOOD: Lipase: 31 U/L (ref 11–51)

## 2018-12-05 MED ORDER — TRAMADOL HCL 50 MG PO TABS: 50.0000 mg | ORAL_TABLET | Freq: Four times a day (QID) | ORAL | 0 refills | Status: DC | PRN

## 2018-12-05 MED ORDER — TRAMADOL HCL 50 MG PO TABS
50.0000 mg | ORAL_TABLET | Freq: Once | ORAL | Status: AC
  Administered 2018-12-05: 50 mg via ORAL
  Filled 2018-12-05: qty 1

## 2018-12-05 MED ORDER — SODIUM CHLORIDE 0.9% FLUSH
3.0000 mL | Freq: Once | INTRAVENOUS | Status: AC
  Administered 2018-12-05: 18:00:00 3 mL via INTRAVENOUS

## 2018-12-05 MED ORDER — IOHEXOL 300 MG/ML  SOLN
100.0000 mL | Freq: Once | INTRAMUSCULAR | Status: AC | PRN
  Administered 2018-12-05: 20:00:00 100 mL via INTRAVENOUS

## 2018-12-05 MED ORDER — AMOXICILLIN-POT CLAVULANATE 875-125 MG PO TABS: 1.0000 | ORAL_TABLET | Freq: Two times a day (BID) | ORAL | 0 refills | Status: AC

## 2018-12-05 MED ORDER — IOHEXOL 240 MG/ML SOLN
50.0000 mL | Freq: Once | INTRAMUSCULAR | Status: AC | PRN
  Administered 2018-12-05: 50 mL via ORAL

## 2018-12-05 NOTE — ED Notes (Addendum)
patient completed 1st bottle of oral contrast.

## 2018-12-05 NOTE — ED Notes (Signed)
Pain to left lower abd pain since Saturday. Sent to ed for further eval.

## 2018-12-05 NOTE — ED Triage Notes (Addendum)
PT c/o abd pain xfew days. PT states PCP dx her with probable diverticulitis. Pt was told to come to ED for worsening pain or fever. PT states her temp was 100.3.  Pt took tylenol PTA.  PT in NAD

## 2018-12-05 NOTE — Discharge Instructions (Addendum)
Follow-up with your regular doctor if not better in 3 days.  Return to the emergency department if worsening.  Follow-up with your regular doctor concerning the pulmonary nodules.  Most of these are benign but they will need to be monitored and reevaluated in 6 to 12 months.

## 2018-12-05 NOTE — ED Provider Notes (Signed)
-----------------------------------------   8:50 PM on 12/05/2018 -----------------------------------------  Patient seen and evaluated by myself in conjunction with physician assistant Greig Right.  Overall the patient appears well, she was started on antibiotics by her physician today for presumed diverticulitis.  Patient does have mild left lower quadrant tenderness.  CT scan is negative for acute abnormality.  Given the patient's mild tenderness I do believe the patient would benefit from completing her course of prescribed antibiotics by her PCP.  Patient agreeable to plan of care will be discharged home with PCP follow-up.   Minna Antis, MD 12/05/18 2051

## 2018-12-05 NOTE — ED Provider Notes (Signed)
[facility] Emergency Department Provider Note  ____________________________________________   None    (approximate)  I have reviewed the triage vital signs and the nursing notes.   HISTORY  Chief Complaint Abdominal Pain    HPI Sehar Sedano is a 85 y.o. female presents emergency department complaining of left lower quadrant pain.  States her physician told her she might have diverticulitis, she was placed on Augmentin, she was sent  to the emergency department because her pain  increased and she had a low-grade temp of 100.3 at home.  She states the pain is better right now but she had taken a pain pill prior to arrival.  She denies any nausea or vomiting.  She denies chest pain or shortness of breath.   Past Medical History:  Diagnosis Date  . Allergy   . Anxiety   . Avitaminosis D 11/25/2014  . Environmental and seasonal allergies 05/30/2015  . HLD (hyperlipidemia) 08/20/2011   Overview:  LDL goal <100 given DM2   . Major depressive disorder with single episode, in partial remission ([place]) 11/25/2014  . Psoriasis 11/25/2014   followed by Dermatology   . Type 2 diabetes mellitus with hyperosmolarity without coma, without long-term current use of insulin ([place]) 09/30/2015    Patient Active Problem List   Diagnosis Date Noted  . Anxiety disorder 11/16/2018  . Primary osteoarthritis of both hands 08/16/2018  . Arthralgia of multiple sites 05/18/2018  . Pernicious anemia 12/29/2017  . DM type 2 with diabetic background retinopathy ([place]) 09/30/2015  . Environmental and seasonal allergies 05/30/2015  . Hip pain, chronic 01/27/2015  . Major depressive disorder with single episode, in partial remission ([place]) 11/25/2014  . Avitaminosis D 11/25/2014  . Psoriasis 11/25/2014  . Hyperlipidemia associated with type 2 diabetes mellitus ([place]) 08/20/2011    Past Surgical History:  Procedure Laterality Date  . BREAST BIOPSY Left    neg-bx/clip  .  BREAST CYST EXCISION Left    neg  . EYE SURGERY Bilateral    cataracts  . PARTIAL HYSTERECTOMY     one ovary remains    Prior to Admission medications   Medication Sig Start Date End Date Taking? Authorizing Provider  acetaminophen (TYLENOL) 650 MG CR tablet Take 650 mg by mouth every 8 (eight) hours as needed for pain.    Yes [provider]  ALPRAZolam (XANAX) 0.25 MG tablet Take 1 tablet (0.25 mg total) by mouth daily as needed for anxiety. 11/16/18  Yes Glean Hess, MD  amoxicillin-clavulanate (AUGMENTIN) 875-125 MG tablet Take 1 tablet by mouth 2 (two) times daily for 10 days. 12/05/18 12/15/18 Yes Glean Hess, MD  conjugated estrogens (PREMARIN) vaginal cream Place 1.65 Applicatorfuls vaginally 3 (three) times a week. 11/17/18  Yes Glean Hess, MD  COSENTYX SENSOREADY 300 DOSE 150 MG/ML SOAJ Inject 600 mg into the skin every 30 (thirty) days. 01/26/16  Yes [provider]  docusate sodium (STOOL SOFTENER) 250 MG capsule Take 250 mg by mouth daily.   Yes [provider]  ferrous sulfate 325 (65 FE) MG tablet Take 325 mg by mouth as directed.    Yes [provider]  Garlic 5374 MG CAPS Take 1 capsule by mouth daily.    Yes [provider]  glimepiride (AMARYL) 1 MG tablet Take 1 tablet (1 mg total) by mouth daily with breakfast. 10/20/18  Yes Glean Hess, MD  JANUVIA 100 MG tablet TAKE 1 TABLET (100 MG TOTAL) BY MOUTH DAILY.  03/15/18  Yes Glean Hess, MD  meclizine (ANTIVERT) 12.5 MG tablet Take 12.5 mg by mouth 3 (three) times daily as needed for dizziness.   Yes [provider]  meloxicam (MOBIC) 7.5 MG tablet Take 1 tablet by mouth daily. Take with food 08/16/18  Yes [provider]  metoprolol succinate (TOPROL-XL) 25 MG 24 hr tablet Take 25 mg by mouth daily.    Yes [provider]  PARoxetine (PAXIL) 30 MG tablet TAKE 1 TABLET BY MOUTH EVERY DAY Patient taking differently: Take 30 mg by  mouth daily.  07/24/18  Yes Glean Hess, MD  traMADol (ULTRAM) 50 MG tablet Take 1 tablet (50 mg total) by mouth every 6 (six) hours as needed for severe pain. 12/05/18  Yes Glean Hess, MD  Triamcinolone Acetonide (TRIAMCINOLONE 0.1 % CREAM : EUCERIN) CREA Apply 1 application topically daily.    Yes [provider]  fluticasone (FLONASE) 50 MCG/ACT nasal spray PLACE 2 SPRAYS INTO BOTH NOSTRILS DAILY. Patient not taking: Reported on 12/05/2018 03/02/17   Glean Hess, MD    Allergies Metformin  Family History  Problem Relation Age of Onset  . Cancer Mother   . Heart disease Father   . Diabetes Son   . Breast cancer Neg Hx     Social History Social History   Tobacco Use  . Smoking status: Former Smoker    Packs/day: 0.25    Years: 20.00    Pack years: 5.00    Types: Cigarettes  . Smokeless tobacco: Never Used  Substance Use Topics  . Alcohol use: No    Alcohol/week: 0.0 standard drinks  . Drug use: No    Review of Systems  Constitutional: No fever/chills Eyes: No visual changes. ENT: No sore throat. Respiratory: Denies cough Cardiovascular: Denies chest pain or shortness of breath Gastrointestinal: Positive for left lower quadrant pain Genitourinary: Negative for dysuria. Musculoskeletal: Negative for back pain. Skin: Negative for rash.    ____________________________________________   PHYSICAL EXAM:  VITAL SIGNS: ED Triage Vitals  Enc Vitals Group     BP 12/05/18 1809 (!) 150/67     Pulse Rate 12/05/18 1809 94     Resp 12/05/18 1809 16     Temp 12/05/18 1809 98.6 F (37 C)     Temp Source 12/05/18 1809 Oral     SpO2 12/05/18 1809 97 %     Weight --      Height --      Head Circumference --      Peak Flow --      Pain Score 12/05/18 1810 2     Pain Loc --      Pain Edu? --      Excl. in [place]? --     Constitutional: Alert and oriented. Well appearing and in no acute distress. Eyes: Conjunctivae are normal.  Head: Atraumatic.  Nose: No congestion/rhinnorhea. Mouth/Throat: Mucous membranes are moist.   Neck:  supple no lymphadenopathy noted Cardiovascular: Normal rate, regular rhythm. Heart sounds are normal Respiratory: Normal respiratory effort.  No retractions, lungs c t a  Abd: soft mildly tender in the left lower quadrant, Bs normal all 4 quad GU: deferred Musculoskeletal: FROM all extremities, warm and well perfused Neurologic:  Normal speech and language.  Skin:  Skin is warm, dry and intact. No rash noted. Psychiatric: Mood and affect are normal. Speech and behavior are normal.  ____________________________________________   LABS (all labs ordered are listed, but only abnormal results are  displayed)  Labs Reviewed  COMPREHENSIVE METABOLIC PANEL - Abnormal; Notable for the following components:      Result Value   CO2 20 (*)    Glucose, Bld 113 (*)    All other components within normal limits  LIPASE, BLOOD  CBC  URINALYSIS, COMPLETE (UACMP) WITH MICROSCOPIC   ____________________________________________   ____________________________________________  RADIOLOGY  CT abdomen/pelvis with IV contrast is basically negative.  Some pulmonary nodules were noted.  ____________________________________________   PROCEDURES  Procedure(s) performed: Tramadol 1 p.o.  Procedures    ____________________________________________   INITIAL IMPRESSION / ASSESSMENT AND PLAN / ED COURSE  Pertinent labs & imaging results that were available during my care of the patient were reviewed by me and considered in my medical decision making (see chart for details).   Patient is 85 year old female presents emergency department complaining of left lower quadrant pain.  Primary care doctor is treating her for diverticulitis.  She was instructed to come to the emergency department if having increased pain or fever which she had both.  Physical exam patient is afebrile.  She appears well.  Abdomen is tender in  the left lower quadrant.  Remainder of exam is unremarkable  Labs CBC, metabolic panel, lipase, UA   ----------------------------------------- 7:42 PM on 12/05/2018 -----------------------------------------  CBC and metabolic panel normal, lipase is normal   ----------------------------------------- 8:50 PM on 12/05/2018 -----------------------------------------  CT abdomen/pelvis is negative for any acute abnormality.  Some pulmonary nodules are noted.  Discussed the CT results with patient.  She is to follow-up with her regular doctor.  She is to continue her antibiotic.  Explained to her the pulmonary nodules are normally benign but they will need to be monitored and reevaluated in 6 to 12 months.  She states she understands will comply.  She was discharged stable condition.  As part of my medical decision making, I reviewed the following data within the [place] notes reviewed and incorporated, Labs reviewed CBC, met B, lipase are normal, Old chart reviewed, Radiograph reviewed CT abdomen/pelvis with contrast is negative for any acute abnormality, Evaluated by EM attending Dr. Kerman Passey, Notes from prior ED visits and [place] Controlled Substance Database  ____________________________________________   FINAL CLINICAL IMPRESSION(S) / ED DIAGNOSES  Final diagnoses:  LLQ pain      NEW MEDICATIONS STARTED DURING THIS VISIT:  New Prescriptions   No medications on file     Note:  This document was prepared using Dragon voice recognition software and may include unintentional dictation errors.    Versie Starks, PA-C 12/05/18 2051    Harvest Dark, MD 12/05/18 2237

## 2018-12-05 NOTE — Patient Instructions (Addendum)
Make sure to eat something with the antibiotics  Drink plenty of fluids  Go to the Emergency Room if you are worsening.  Take the tramadol 1/2 to 1 tablet twice a day as needed for severe pain

## 2018-12-05 NOTE — Progress Notes (Signed)
Date:  12/05/2018   Name:  Jordan Montoya   DOB:  [birth year]   MRN:  [id number]   Chief Complaint: Flank Pain (L) sided pain with vomiting. Started Friday and pain is coming and going with not getting any better. )  Abdominal Pain  This is a new problem. The current episode started in the past 7 days. The onset quality is sudden. The problem occurs daily. The problem has been waxing and waning. The pain is located in the LLQ, suprapubic region and left flank. The pain is moderate. The quality of the pain is aching. Associated symptoms include constipation, dysuria, flatus and vomiting. Pertinent negatives include no diarrhea, fever, headaches, hematuria or nausea. Nothing aggravates the pain.  The pain started 4 nights ago with sudden onset in the left lower abdomen.  It is cramping and aching, not sharp and has continued more or less constantly.  Two days ago she had one episode of bilious vomit.  Yesterday a hot dog got stuck in her throat and she had to regurgitate it.  She has not had diarrhea, stools have been hard, her last stool was yesterday.  She feels week from the constant pain and has little appetite.  She has been drinking fluids well.  She has some constant pain with urination which is chronic and unchanged.  She denies fever, chills, chest pain, cough, SOB, diarrhea or blood in the stool or urine.   Review of Systems  Constitutional: Positive for fatigue. Negative for chills and fever.  HENT: Positive for trouble swallowing.   Respiratory: Negative for chest tightness, shortness of breath and wheezing.   Cardiovascular: Negative for chest pain and palpitations.  Gastrointestinal: Positive for abdominal pain, constipation, flatus and vomiting. Negative for anal bleeding, blood in stool, diarrhea and nausea.  Genitourinary: Positive for dysuria. Negative for hematuria.  Skin: Negative for rash.  Neurological: Positive for weakness and light-headedness. Negative for dizziness,  tremors, syncope and headaches.  Hematological: Negative for adenopathy.  Psychiatric/Behavioral: Negative for sleep disturbance.    Patient Active Problem List   Diagnosis Date Noted  . Anxiety disorder 11/16/2018  . Primary osteoarthritis of both hands 08/16/2018  . Arthralgia of multiple sites 05/18/2018  . Pernicious anemia 12/29/2017  . 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 Environmental and seasonal allergies 05/30/2015  . Hip pain, chronic 01/27/2015  . 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 Avitaminosis D 11/25/2014  . Psoriasis 11/25/2014  . Hyperlipidemia associated with type 2 diabetes mellitus ([facility]) 08/20/2011    Allergies  Allergen Reactions  . Metformin Nausea Only    Past Surgical History:  Procedure Laterality Date  . BREAST BIOPSY Left    neg-bx/clip  . BREAST CYST EXCISION Left    neg  . EYE SURGERY Bilateral    cataracts  . PARTIAL HYSTERECTOMY     one ovary remains    Social History   Tobacco Use  . Smoking status: Former Smoker    Packs/day: 0.25    Years: 20.00    Pack years: 5.00    Types: Cigarettes  . Smokeless tobacco: Never Used  Substance Use Topics  . Alcohol use: No    Alcohol/week: 0.0 standard drinks  . Drug use: No     Medication list has been reviewed and updated.  Current Meds  Medication Sig  . acetaminophen (TYLENOL) 650 MG CR tablet One tablet as needed every 8-12 hours as needed  for joint pain  . ALPRAZolam (XANAX) 0.25 MG tablet Take 1 tablet (0.25 mg total) by mouth daily as needed for anxiety.  . conjugated estrogens (PREMARIN) vaginal cream Place 0.25 Applicatorfuls vaginally 3 (three) times a week.  Lorenso Quarry. COSENTYX SENSOREADY 300 DOSE 150 MG/ML SOAJ Inject 600 mg into the skin every 30 (thirty) days.  Marland Kitchen. docusate sodium (STOOL SOFTENER) 250 MG capsule Take 250 mg by mouth daily.  . ferrous sulfate 325 (65 FE) MG tablet Take by mouth.  .  fluticasone (FLONASE) 50 MCG/ACT nasal spray PLACE 2 SPRAYS INTO BOTH NOSTRILS DAILY.  . Garlic 1000 MG CAPS Take 1 capsule by mouth.  Marland Kitchen. glimepiride (AMARYL) 1 MG tablet Take 1 tablet (1 mg total) by mouth daily with breakfast.  . hydrOXYzine (ATARAX/VISTARIL) 10 MG tablet Take 10 mg by mouth daily.  Marland Kitchen. JANUVIA 100 MG tablet TAKE 1 TABLET (100 MG TOTAL) BY MOUTH DAILY.  . meclizine (ANTIVERT) 12.5 MG tablet Take 12.5 mg by mouth 3 (three) times daily as needed for dizziness.  . meloxicam (MOBIC) 7.5 MG tablet Take 1 tablet by mouth daily. Take with food  . metoprolol succinate (TOPROL-XL) 25 MG 24 hr tablet Take 12.5 mg by mouth daily.  Marland Kitchen. PARoxetine (PAXIL) 30 MG tablet TAKE 1 TABLET BY MOUTH EVERY DAY  . Triamcinolone Acetonide (TRIAMCINOLONE 0.1 % CREAM : EUCERIN) CREA 1(ONE) APPLICATION(S) TOPICAL EVERY DAY FROM THE NECK DOWN FOR ITCHING  . vitamin B-12 (CYANOCOBALAMIN) 1000 MCG tablet Take by mouth.    PHQ 2/9 Scores 12/05/2018 11/16/2018 10/16/2018 05/18/2018  PHQ - 2 Score 3 1 0 0  PHQ- 9 Score 12 6 6 2     BP Readings from Last 3 Encounters:  12/05/18 (!) 144/58  10/16/18 132/62  07/07/18 120/78    Physical Exam Constitutional:      Appearance: She is ill-appearing.  Neck:     Musculoskeletal: Normal range of motion.  Cardiovascular:     Rate and Rhythm: Normal rate and regular rhythm.     Pulses: Normal pulses.  Pulmonary:     Effort: Pulmonary effort is normal.     Breath sounds: Normal breath sounds. No wheezing.  Abdominal:     General: Abdomen is flat. Bowel sounds are normal.     Palpations: Abdomen is soft.     Tenderness: There is abdominal tenderness in the epigastric area, suprapubic area and left lower quadrant. There is no right CVA tenderness, left CVA tenderness, guarding or rebound.  Lymphadenopathy:     Cervical: No cervical adenopathy.  Neurological:     Mental Status: She is alert.  Psychiatric:        Attention and Perception: Attention normal.         Mood and Affect: Mood normal.        Cognition and Memory: Cognition normal.     Wt Readings from Last 3 Encounters:  12/05/18 163 lb (73.9 kg)  11/16/18 164 lb (74.4 kg)  10/16/18 164 lb 3.2 oz (74.5 kg)    BP (!) 144/58   Pulse 92   Temp 98.6 F (37 C)   Ht 5\' 6"  (1.676 m)   Wt 163 lb (73.9 kg)   SpO2 93%   BMI 26.31 kg/m   Assessment and Plan: 1. Left lower quadrant abdominal pain Doubt renal stone or UTI Favor diverticulitis but can not exclude other intraabdominal process Pt instructed to go to ED if worsening despite antibiotics, fluids and pain medication Family friend is aware of the  plan, will ask family to check on Ms. Heinkel several times a day - CBC with Differential/Platelet - Comprehensive metabolic panel - POC urinalysis w microscopic (non auto) - amoxicillin-clavulanate (AUGMENTIN) 875-125 MG tablet; Take 1 tablet by mouth 2 (two) times daily for 10 days.  Dispense: 20 tablet; Refill: 0 - traMADol (ULTRAM) 50 MG tablet; Take 1 tablet (50 mg total) by mouth every 6 (six) hours as needed for severe pain.  Dispense: 10 tablet; Refill: 0 (Rx printed due to mal-function of escribing at this time)   Partially dictated using [employer]. Any errors are unintentional.  Bari Edward, MD [facility]  12/05/2018

## 2018-12-05 NOTE — ED Notes (Signed)
Patient off unit to ct  

## 2018-12-06 LAB — COMPREHENSIVE METABOLIC PANEL
ALT: 15 IU/L (ref 0–32)
AST: 17 IU/L (ref 0–40)
Albumin/Globulin Ratio: 1.7 (ref 1.2–2.2)
Albumin: 4.7 g/dL — ABNORMAL HIGH (ref 3.6–4.6)
Alkaline Phosphatase: 75 IU/L (ref 39–117)
BUN/Creatinine Ratio: 21 (ref 12–28)
BUN: 19 mg/dL (ref 8–27)
Bilirubin Total: 0.6 mg/dL (ref 0.0–1.2)
CO2: 19 mmol/L — ABNORMAL LOW (ref 20–29)
Calcium: 8.9 mg/dL (ref 8.7–10.3)
Chloride: 102 mmol/L (ref 96–106)
Creatinine, Ser: 0.89 mg/dL (ref 0.57–1.00)
GFR calc Af Amer: 69 mL/min/{1.73_m2} (ref >59)
GFR calc non Af Amer: 60 mL/min/{1.73_m2} (ref >59)
Globulin, Total: 2.8 g/dL (ref 1.5–4.5)
Glucose: 145 mg/dL — ABNORMAL HIGH (ref 65–99)
Potassium: 4 mmol/L (ref 3.5–5.2)
Sodium: 138 mmol/L (ref 134–144)
Total Protein: 7.5 g/dL (ref 6.0–8.5)

## 2018-12-06 LAB — CBC WITH DIFFERENTIAL/PLATELET
Basophils Absolute: 0 10*3/uL (ref 0.0–0.2)
Basos: 1 %
EOS (ABSOLUTE): 0.1 10*3/uL (ref 0.0–0.4)
Eos: 1 %
Hematocrit: 43.2 % (ref 34.0–46.6)
Hemoglobin: 14.8 g/dL (ref 11.1–15.9)
Immature Grans (Abs): 0 10*3/uL (ref 0.0–0.1)
Immature Granulocytes: 0 %
Lymphocytes Absolute: 1.3 10*3/uL (ref 0.7–3.1)
Lymphs: 21 %
MCH: 31.4 pg (ref 26.6–33.0)
MCHC: 34.3 g/dL (ref 31.5–35.7)
MCV: 92 fL (ref 79–97)
Monocytes Absolute: 0.6 10*3/uL (ref 0.1–0.9)
Monocytes: 10 %
Neutrophils Absolute: 4.1 10*3/uL (ref 1.4–7.0)
Neutrophils: 67 %
Platelets: 231 10*3/uL (ref 150–450)
RBC: 4.71 x10E6/uL (ref 3.77–5.28)
RDW: 12.8 % (ref 11.7–15.4)
WBC: 6.2 10*3/uL (ref 3.4–10.8)

## 2018-12-07 ENCOUNTER — Telehealth: Payer: Self-pay

## 2018-12-07 NOTE — Telephone Encounter (Signed)
Patient son Lyda Jester called saying patient is getting worse. She will not eat, she is in a lot of pain. Told him he needs to take her back to the ER. Told him the CT suggested diverticular disease and it could be getting worse.  He said he will try to get her to go. She says she don't want to go to the hospital again.

## 2018-12-09 ENCOUNTER — Emergency Department
Admission: EM | Admit: 2018-12-09 | Discharge: 2018-12-09 | Disposition: A | Discharge status: Home / Self Care | Payer: Medicare Other | Attending: Emergency Medicine | Admitting: Emergency Medicine

## 2018-12-09 ENCOUNTER — Ambulatory Visit (INDEPENDENT_AMBULATORY_CARE_PROVIDER_SITE_OTHER)
Admission: EM | Admit: 2018-12-09 | Discharge: 2018-12-09 | Disposition: A | Discharge status: Home / Self Care | Payer: Medicare Other | Source: Home / Self Care

## 2018-12-09 ENCOUNTER — Other Ambulatory Visit: Payer: Self-pay

## 2018-12-09 ENCOUNTER — Encounter: Payer: Self-pay | Admitting: Emergency Medicine

## 2018-12-09 DIAGNOSIS — F329 Major depressive disorder, single episode, unspecified: Secondary | ICD-10-CM | POA: Diagnosis not present

## 2018-12-09 DIAGNOSIS — R1030 Lower abdominal pain, unspecified: Secondary | ICD-10-CM | POA: Insufficient documentation

## 2018-12-09 DIAGNOSIS — K5792 Diverticulitis of intestine, part unspecified, without perforation or abscess without bleeding: Secondary | ICD-10-CM | POA: Diagnosis not present

## 2018-12-09 DIAGNOSIS — R110 Nausea: Secondary | ICD-10-CM

## 2018-12-09 DIAGNOSIS — B029 Zoster without complications: Secondary | ICD-10-CM

## 2018-12-09 DIAGNOSIS — E119 Type 2 diabetes mellitus without complications: Secondary | ICD-10-CM | POA: Diagnosis not present

## 2018-12-09 DIAGNOSIS — B028 Zoster with other complications: Secondary | ICD-10-CM

## 2018-12-09 DIAGNOSIS — F419 Anxiety disorder, unspecified: Secondary | ICD-10-CM | POA: Diagnosis not present

## 2018-12-09 DIAGNOSIS — Z87891 Personal history of nicotine dependence: Secondary | ICD-10-CM | POA: Insufficient documentation

## 2018-12-09 DIAGNOSIS — Z79899 Other long term (current) drug therapy: Secondary | ICD-10-CM | POA: Diagnosis not present

## 2018-12-09 DIAGNOSIS — Z7984 Long term (current) use of oral hypoglycemic drugs: Secondary | ICD-10-CM | POA: Diagnosis not present

## 2018-12-09 DIAGNOSIS — R1084 Generalized abdominal pain: Secondary | ICD-10-CM

## 2018-12-09 DIAGNOSIS — R109 Unspecified abdominal pain: Secondary | ICD-10-CM

## 2018-12-09 LAB — URINALYSIS, COMPLETE (UACMP) WITH MICROSCOPIC
Bacteria, UA: NONE SEEN
Bilirubin Urine: NEGATIVE
Glucose, UA: NEGATIVE mg/dL
Ketones, ur: 20 mg/dL — AB
Leukocytes,Ua: NEGATIVE
Nitrite: NEGATIVE
Protein, ur: 30 mg/dL — AB
Specific Gravity, Urine: 1.019 (ref 1.005–1.030)
pH: 5 (ref 5.0–8.0)

## 2018-12-09 LAB — LIPASE, BLOOD: Lipase: 27 U/L (ref 11–51)

## 2018-12-09 LAB — COMPREHENSIVE METABOLIC PANEL
ALT: 16 U/L (ref 0–44)
AST: 19 U/L (ref 15–41)
Albumin: 4.1 g/dL (ref 3.5–5.0)
Alkaline Phosphatase: 64 U/L (ref 38–126)
Anion gap: 9 (ref 5–15)
BUN: 12 mg/dL (ref 8–23)
CO2: 23 mmol/L (ref 22–32)
Calcium: 9.3 mg/dL (ref 8.9–10.3)
Chloride: 106 mmol/L (ref 98–111)
Creatinine, Ser: 0.8 mg/dL (ref 0.44–1.00)
GFR calc Af Amer: >60 mL/min (ref >60)
GFR calc non Af Amer: >60 mL/min (ref >60)
Glucose, Bld: 126 mg/dL — ABNORMAL HIGH (ref 70–99)
Potassium: 3.9 mmol/L (ref 3.5–5.1)
Sodium: 138 mmol/L (ref 135–145)
Total Bilirubin: 0.8 mg/dL (ref 0.3–1.2)
Total Protein: 7.8 g/dL (ref 6.5–8.1)

## 2018-12-09 LAB — CBC
HCT: 42.7 % (ref 36.0–46.0)
Hemoglobin: 14.5 g/dL (ref 12.0–15.0)
MCH: 31.1 pg (ref 26.0–34.0)
MCHC: 34 g/dL (ref 30.0–36.0)
MCV: 91.6 fL (ref 80.0–100.0)
Platelets: 151 10*3/uL (ref 150–400)
RBC: 4.66 MIL/uL (ref 3.87–5.11)
RDW: 12.6 % (ref 11.5–15.5)
WBC: 4.2 10*3/uL (ref 4.0–10.5)
nRBC: 0 % (ref 0.0–0.2)

## 2018-12-09 MED ORDER — POLYETHYLENE GLYCOL 3350 17 G PO PACK: 17.0000 g | PACK | Freq: Every day | ORAL | 0 refills | Status: DC

## 2018-12-09 MED ORDER — SODIUM CHLORIDE 0.9% FLUSH: 3.0000 mL | Freq: Once | INTRAVENOUS | Status: DC

## 2018-12-09 MED ORDER — VALACYCLOVIR HCL 1 G PO TABS: 1000.0000 mg | ORAL_TABLET | Freq: Three times a day (TID) | ORAL | 0 refills | Status: AC

## 2018-12-09 MED ORDER — OXYCODONE-ACETAMINOPHEN 5-325 MG PO TABS: 1.0000 | ORAL_TABLET | ORAL | 0 refills | Status: DC | PRN

## 2018-12-09 NOTE — ED Triage Notes (Signed)
Pt to ED via POV from [facility]. Pt was being treated for diverticulitis. Pt stopped taking her amoxicillin today because she thought that was the cause of her rash. Pt has fluid filled vesicles on her left buttock, suspicious for shingles. Pt states that she just noticed the rash today but has been having pain in that area for the last several days. Pt is in NAD.

## 2018-12-09 NOTE — ED Triage Notes (Signed)
Patient states that she has a rash on her backside that she noticed today. Patient states that she was seen in the ED and treated for Diverticulitis. Patient states that she was treated with amoxicillin. Patient states that she is still having abdominal pain. Patient states that call a nurse told her to come be seen for the rash and possibly switch the antibiotic.

## 2018-12-09 NOTE — ED Provider Notes (Signed)
[facility] Emergency Department Provider Note  ____________________________________________   I have reviewed the triage vital signs and the nursing notes.   HISTORY  Chief Complaint Abdominal Pain and Herpes Zoster   History limited by: Not Limited   HPI Jordan Montoya is a 85 y.o. female who presents to the emergency department today from urgent care for unclear reasons. Patient is unclear why they told her to come to the emergency department.  Patient went to urgent care for rash. Urgent care noted rash was consistent with shingles. She was also complaining of continued abdominal pain. She was seen in the emergency department four days ago for this pain. She says it is located throughout the lower abdomen. Had a CT scan which was negative at that time. Did however finish course of abx for diverticulitis. She denies any change in bowel or urine.   Records reviewed. Per medical record review patient has a history of recent ER visit with negative CT scan. Urgent care visit today.   Past Medical History:  Diagnosis Date  . Allergy   . Anxiety   . Avitaminosis D 11/25/2014  . Environmental and seasonal allergies 05/30/2015  . HLD (hyperlipidemia) 08/20/2011   Overview:  LDL goal <100 given DM2   . 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 Psoriasis 11/25/2014   followed by Dermatology   . Type 2 diabetes mellitus with hyperosmolarity without coma, without long-term current use of insulin (HCC) 09/30/2015    Patient Active Problem List   Diagnosis Date Noted  . Anxiety disorder 11/16/2018  . Primary osteoarthritis of both hands 08/16/2018  . Arthralgia of multiple sites 05/18/2018  . Pernicious anemia 12/29/2017  . 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 Environmental and seasonal allergies 05/30/2015  . Hip pain, chronic 01/27/2015  . Major depressive disorder with single  episode, in partial remission (HCC) 11/25/2014  . Avitaminosis D 11/25/2014  . Psoriasis 11/25/2014  . Hyperlipidemia associated with type 2 diabetes mellitus (HCC) 08/20/2011    Past Surgical History:  Procedure Laterality Date  . BREAST BIOPSY Left    neg-bx/clip  . BREAST CYST EXCISION Left    neg  . EYE SURGERY Bilateral    cataracts  . PARTIAL HYSTERECTOMY     one ovary remains    Prior to Admission medications   Medication Sig Start Date End Date Taking? Authorizing Provider  acetaminophen (TYLENOL) 650 MG CR tablet Take 650 mg by mouth every 8 (eight) hours as needed for pain.     [provider]  ALPRAZolam Prudy Feeler) 0.25 MG tablet Take 1 tablet (0.25 mg total) by mouth daily as needed for anxiety. 11/16/18   Reubin Milan, MD  amoxicillin-clavulanate (AUGMENTIN) 875-125 MG tablet Take 1 tablet by mouth 2 (two) times daily for 10 days. 12/05/18 12/15/18  Reubin Milan, MD  conjugated estrogens (PREMARIN) vaginal cream Place 0.25 Applicatorfuls vaginally 3 (three) times a week. 11/17/18   Reubin Milan, MD  COSENTYX SENSOREADY 300 DOSE 150 MG/ML SOAJ Inject 600 mg into the skin every 30 (thirty) days. 01/26/16   [provider]  docusate sodium (STOOL SOFTENER) 250 MG capsule Take 250 mg by mouth daily.    [provider]  ferrous sulfate 325 (65 FE) MG tablet Take 325 mg by mouth as directed.     [provider]  fluticasone (FLONASE) 50 MCG/ACT nasal spray PLACE 2 SPRAYS INTO BOTH NOSTRILS DAILY.  03/02/17   Reubin MilanBerglund, Laura H, MD  Garlic 1000 MG CAPS Take 1 capsule by mouth daily.     [provider]  glimepiride (AMARYL) 1 MG tablet Take 1 tablet (1 mg total) by mouth daily with breakfast. 10/20/18   Reubin MilanBerglund, Laura H, MD  JANUVIA 100 MG tablet TAKE 1 TABLET (100 MG TOTAL) BY MOUTH DAILY. 03/15/18   Reubin MilanBerglund, Laura H, MD  meclizine (ANTIVERT) 12.5 MG tablet Take 12.5 mg by mouth 3 (three) times daily as needed for dizziness.     [provider]  meloxicam (MOBIC) 7.5 MG tablet Take 1 tablet by mouth daily. Take with food 08/16/18   [provider]  metoprolol succinate (TOPROL-XL) 25 MG 24 hr tablet Take 25 mg by mouth daily.     [provider]  PARoxetine (PAXIL) 30 MG tablet TAKE 1 TABLET BY MOUTH EVERY DAY Patient taking differently: Take 30 mg by mouth daily.  07/24/18   Reubin MilanBerglund, Laura H, MD  traMADol (ULTRAM) 50 MG tablet Take 1 tablet (50 mg total) by mouth every 6 (six) hours as needed for severe pain. 12/05/18   Reubin MilanBerglund, Laura H, MD  Triamcinolone Acetonide (TRIAMCINOLONE 0.1 % CREAM : EUCERIN) CREA Apply 1 application topically daily.     [provider]    Allergies Metformin  Family History  Problem Relation Age of Onset  . Cancer Mother   . Heart disease Father   . Diabetes Son   . Breast cancer Neg Hx     Social History Social History   Tobacco Use  . Smoking status: Former Smoker    Packs/day: 0.25    Years: 20.00    Pack years: 5.00    Types: Cigarettes  . Smokeless tobacco: Never Used  Substance Use Topics  . Alcohol use: No    Alcohol/week: 0.0 standard drinks  . Drug use: No    Review of Systems Constitutional: No fever/chills Eyes: No visual changes. ENT: No sore throat. Cardiovascular: Denies chest pain. Respiratory: Denies shortness of breath. Gastrointestinal: Positive for abdominal pain.  Genitourinary: Negative for dysuria. Musculoskeletal: Negative for back pain. Skin: Positive for rash.  Neurological: Negative for headaches, focal weakness or numbness.  ____________________________________________   PHYSICAL EXAM:  VITAL SIGNS: ED Triage Vitals  Enc Vitals Group     BP 12/09/18 1536 (!) 150/67     Pulse Rate 12/09/18 1536 (!) 102     Resp 12/09/18 1536 16     Temp 12/09/18 1536 99.1 F (37.3 C)     Temp Source 12/09/18 1536 Oral     SpO2 12/09/18 1536 95 %     Weight --      Height --      Head Circumference --       Peak Flow --      Pain Score 12/09/18 1542 9   Constitutional: Alert and oriented.  Eyes: Conjunctivae are normal.  ENT      Head: Normocephalic and atraumatic.      Nose: No congestion/rhinnorhea.      Mouth/Throat: Mucous membranes are moist.      Neck: No stridor. Hematological/Lymphatic/Immunilogical: No cervical lymphadenopathy. Cardiovascular: Normal rate, regular rhythm.  No murmurs, rubs, or gallops. Respiratory: Normal respiratory effort without tachypnea nor retractions. Breath sounds are clear and equal bilaterally. No wheezes/rales/rhonchi. Gastrointestinal: Soft and non tender. No rebound. No guarding.  Genitourinary: Deferred Musculoskeletal: Normal range of motion in all extremities. No lower extremity edema. Neurologic:  Normal speech and language. No  gross focal neurologic deficits are appreciated.  Skin:  Rash consistent with shingles noted to left upper buttock.  Psychiatric: Mood and affect are normal. Speech and behavior are normal. Patient exhibits appropriate insight and judgment.  ____________________________________________    LABS (pertinent positives/negatives)  Lipase 27 CBC wbc 4.2, hgb 14.5, plt 151 CMP wnl except glu 126  ____________________________________________   EKG  None  ____________________________________________    RADIOLOGY  None  ____________________________________________   PROCEDURES  Procedures  ____________________________________________   INITIAL IMPRESSION / ASSESSMENT AND PLAN / ED COURSE  Pertinent labs & imaging results that were available during my care of the patient were reviewed by me and considered in my medical decision making (see chart for details).   Patient presents to the emergency department today sent from urgent care for unclear reasons. She continues to have abdominal pain which she was evaluated for with lab and CT 4 days ago. Had new symptoms of rash and was told she had shingles at  urgent care. Blood work does not show leukocytosis or concerning for electrolyte abnormality. At this time given lack of fever, lack of concerning blood work and benign exam and negative ct scan four days ago do not feel patient requires repeat imaging. Did discuss imaging with patient and she felt comfortable deferring at this time. Will give prescription for antiviral and pain medication. ____________________________________________   FINAL CLINICAL IMPRESSION(S) / ED DIAGNOSES  Final diagnoses:  Herpes zoster without complication  Abdominal pain, unspecified abdominal location     Note: This dictation was prepared with Dragon dictation. Any transcriptional errors that result from this process are unintentional     Phineas Semen, MD 12/09/18 [phone]

## 2018-12-09 NOTE — Discharge Instructions (Addendum)
Go to the ED now for further evaluation. You may need to be admitted for antibiotics, fluids, and pain control.

## 2018-12-09 NOTE — ED Provider Notes (Signed)
[address], [address] [place], [place] [address] [phone]   Name: Jordan Montoya DOB: January 30, 1934 MRN: [id number] CSN: [id number] PCP: Reubin MilanBerglund, Laura H, MD  Arrival date and time:  12/09/18 1409  Chief Complaint:  No chief complaint on file.  NOTE: Prior to seeing the patient today, I have reviewed the triage nursing documentation and vital signs. Clinical staff has updated patient's PMH/PSHx, current medication list, and drug allergies/intolerances to ensure comprehensive history available to assist in medical decision making.   History:   HPI: Jordan Montoya is a 85 y.o. female who presents today with complaints of lower abdominal pain for the last week. Patient states, "I have just been sick for the last 6 or 7 days". Patient with significant nausea without vomiting. She denies diarrhea. Patient reporting that she was seen by PCP on Tuesday and diagnosed with diverticulitis. She was prescribed antibiotics and sent home. On the evening of 12/05/2018, patient notes that she developing increasing pain and a fever, which prompted to her present to the ED for further evaluation. In the ED, patient had a CT scan of her abdomen that was negative. She was discharged home with instructions to continue the prescribed course of Augmentin.   Patient presents today reporting that she is unable to eat and drink because of her nausea. She has stopped taking the prescribed course of antibiotics citing that it caused her to develop a rash. She states, "I am not taking any more of that amoxicillin". Patient contacted PCP's office and was [facility] and to discuss a possible change in her antibiotic therapy. Patient presents with her son today. Son states, "She has been on the antibiotics without much improvement. I don;t see her getting any better really. She is still having the nausea and the abdominal pain. Now she has this rash".   Past Medical  History:  Diagnosis Date  . Allergy   . Anxiety   . Avitaminosis D 11/25/2014  . Environmental and seasonal allergies 05/30/2015  . HLD (hyperlipidemia) 08/20/2011   Overview:  LDL goal <100 given DM2   . 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 Psoriasis 11/25/2014   followed by Dermatology   . Type 2 diabetes mellitus with hyperosmolarity without coma, without long-term current use of insulin (HCC) 09/30/2015    Past Surgical History:  Procedure Laterality Date  . BREAST BIOPSY Left    neg-bx/clip  . BREAST CYST EXCISION Left    neg  . EYE SURGERY Bilateral    cataracts  . PARTIAL HYSTERECTOMY     one ovary remains    Family History  Problem Relation Age of Onset  . Cancer Mother   . Heart disease Father   . Diabetes Son   . Breast cancer Neg Hx     Social History   Socioeconomic History  . Marital status: Widowed    Spouse name: Not on file  . Number of children: 2  . Years of education: Not on file  . Highest education level: 11th grade  Occupational History  . Occupation: Retired  Engineer, productionocial Needs  . Financial resource strain: Not hard at all  . Food insecurity:    Worry: Never true    Inability: Never true  . Transportation needs:    Medical: No    Non-medical: No  Tobacco Use  . Smoking status: Former Smoker    Packs/day: 0.25    Years: 20.00  Pack years: 5.00    Types: Cigarettes  . Smokeless tobacco: Never Used  Substance and Sexual Activity  . Alcohol use: No    Alcohol/week: 0.0 standard drinks  . Drug use: No  . Sexual activity: Not Currently  Lifestyle  . Physical activity:    Days per week: 0 days    Minutes per session: 0 min  . Stress: Rather much  Relationships  . Social connections:    Talks on phone: More than three times a week    Gets together: Once a week    Attends religious service: More than 4 times per year    Active member of club or organization: No    Attends meetings of  clubs or organizations: Never    Relationship status: Widowed  . Intimate partner violence:    Fear of current or ex partner: No    Emotionally abused: No    Physically abused: No    Forced sexual activity: No  Other Topics Concern  . Not on file  Social History Narrative  . Not on file    Patient Active Problem List   Diagnosis Date Noted  . Anxiety disorder 11/16/2018  . Primary osteoarthritis of both hands 08/16/2018  . Arthralgia of multiple sites 05/18/2018  . Pernicious anemia 12/29/2017  . 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 Environmental and seasonal allergies 05/30/2015  . Hip pain, chronic 01/27/2015  . 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 Avitaminosis D 11/25/2014  . Psoriasis 11/25/2014  . Hyperlipidemia associated with type 2 diabetes mellitus (HCC) 08/20/2011    Home Medications:    Current Meds  Medication Sig  . acetaminophen (TYLENOL) 650 MG CR tablet Take 650 mg by mouth every 8 (eight) hours as needed for pain.   Marland Kitchen ALPRAZolam (XANAX) 0.25 MG tablet Take 1 tablet (0.25 mg total) by mouth daily as needed for anxiety.  Marland Kitchen amoxicillin-clavulanate (AUGMENTIN) 875-125 MG tablet Take 1 tablet by mouth 2 (two) times daily for 10 days.  Marland Kitchen conjugated estrogens (PREMARIN) vaginal cream Place 0.25 Applicatorfuls vaginally 3 (three) times a week.  Lorenso Quarry SENSOREADY 300 DOSE 150 MG/ML SOAJ Inject 600 mg into the skin every 30 (thirty) days.  Marland Kitchen docusate sodium (STOOL SOFTENER) 250 MG capsule Take 250 mg by mouth daily.  . ferrous sulfate 325 (65 FE) MG tablet Take 325 mg by mouth as directed.   . fluticasone (FLONASE) 50 MCG/ACT nasal spray PLACE 2 SPRAYS INTO BOTH NOSTRILS DAILY.  . Garlic 1000 MG CAPS Take 1 capsule by mouth daily.   Marland Kitchen glimepiride (AMARYL) 1 MG tablet Take 1 tablet (1 mg total) by mouth daily with breakfast.  . JANUVIA 100 MG tablet TAKE 1 TABLET (100 MG TOTAL) BY  MOUTH DAILY.  . meclizine (ANTIVERT) 12.5 MG tablet Take 12.5 mg by mouth 3 (three) times daily as needed for dizziness.  . meloxicam (MOBIC) 7.5 MG tablet Take 1 tablet by mouth daily. Take with food  . metoprolol succinate (TOPROL-XL) 25 MG 24 hr tablet Take 25 mg by mouth daily.   Marland Kitchen PARoxetine (PAXIL) 30 MG tablet TAKE 1 TABLET BY MOUTH EVERY DAY (Patient taking differently: Take 30 mg by mouth daily. )  . traMADol (ULTRAM) 50 MG tablet Take 1 tablet (50 mg total) by mouth every 6 (six) hours as needed for severe pain.  . Triamcinolone Acetonide (TRIAMCINOLONE 0.1 % CREAM : EUCERIN) CREA Apply 1 application topically daily.  Allergies:   Metformin  Review of Systems (ROS): Review of Systems  Constitutional: Negative for chills and fever.  Respiratory: Negative for cough and shortness of breath.   Cardiovascular: Negative for chest pain and palpitations.  Gastrointestinal: Positive for abdominal pain (diffuse) and nausea. Negative for diarrhea and vomiting.  Skin: Positive for rash.  Neurological: Positive for weakness (generalized) and light-headedness. Negative for seizures and syncope.  Hematological: Negative for adenopathy.  All other systems reviewed and are negative.    Physical Exam:  Triage Vital Signs ED Triage Vitals  Enc Vitals Group     BP 12/09/18 1433 (!) 159/66     Pulse Rate 12/09/18 1433 (!) 101     Resp 12/09/18 1433 16     Temp 12/09/18 1433 98.5 F (36.9 C)     Temp Source 12/09/18 1433 Oral     SpO2 12/09/18 1433 97 %     Weight 12/09/18 1432 163 lb (73.9 kg)     Height 12/09/18 1432  (1.676 m)     Head Circumference --      Peak Flow --      Pain Score 12/09/18 1431 3     Pain Loc --      Pain Edu? --      Excl. in GC? --     Physical Exam  Constitutional: She is oriented to person, place, and time and well-developed, well-nourished, and in no distress.  HENT:  Head: Normocephalic and atraumatic.  Mouth/Throat: Oropharynx is clear and  moist and mucous membranes are normal.  Eyes: Pupils are equal, round, and reactive to light. EOM are normal.  Neck: Normal range of motion. Neck supple. No tracheal deviation present.  Cardiovascular: Normal rate, regular rhythm, normal heart sounds and intact distal pulses. Exam reveals no gallop and no friction rub.  No murmur heard. Pulmonary/Chest: Effort normal and breath sounds normal. No respiratory distress. She has no wheezes. She has no rales.  Abdominal: Soft. Normal appearance and bowel sounds are normal. She exhibits no distension. There is no hepatosplenomegaly. There is generalized abdominal tenderness. There is no rebound and no CVA tenderness.  Lymphadenopathy:    She has no cervical adenopathy.  Neurological: She is alert and oriented to person, place, and time.  Skin: Skin is warm and dry. Rash noted. Rash is vesicular (Extending alon same dermatomal pattern from LEFT buttock to groin).  Psychiatric: Mood, affect and judgment normal.  Nursing note and vitals reviewed.       Urgent Care Treatments / Results:   LABS: PLEASE NOTE: all labs that were ordered this encounter are listed, however only abnormal results are displayed. Labs Reviewed - No data to display  EKG: -None  RADIOLOGY: No results found.  PRODEDURES: Procedures  MEDICATIONS RECEIVED THIS VISIT: Medications - No data to display  PERTINENT CLINICAL COURSE NOTES/UPDATES: No data to display   Initial Impression / Assessment and Plan / Urgent Care Course:    Rheba Diamond is a 85 y.o. female who presents to [facility] today with complaints of No chief complaint on file.  Pertinent labs & imaging results that were available during my care of the patient were personally reviewed by me and considered in my medical decision making (see lab/imaging section of note for values and interpretations).  Patient presents today with continued abdominal pain and associated nausea. She is  currently being treated with course of Augmentin for presumed diverticulitis. She is not eating and drinking normally per her  report. Son presents with patient today and voices concerns that patient is not improving despite treatment thus far. Given treatment with antibiotics, continued pain, and poor oral intake patient may benefit from further evaluation in the ED for consideration of admission for continued IV ABX, fluids, and symptomatic management. Her symptoms have "not really gotten any better" per her reports, thus her care needs are beyond what can be done today in the [facility] setting. Patient and son willing to present to the ED for further evaluation.   Additionally, patient has developed a herpetic rash that patient is attributing to an antibiotic reaction. Discussed that rash appearance and distribution is not related to a drug reaction. Rather, the rash appears to be viral and most consistent with shingles. Patient will need treatment for this as well, however due to current abdominal pain and nausea, will defer treatment to ED/hospital.   Patient report called to ED charge nurse Earlene Plater, RN). She was made aware of presenting complaints and reason for patient being sent to the ED. Charge nurse aware of acute viral rash. Patient presenting from [facility] to [facility] ED via POV.    Final Clinical Impressions(s) / Urgent Care Diagnoses:   Final diagnoses:  Diverticulitis  Herpes zoster with complication  Nausea without vomiting    New Prescriptions:  No orders of the defined types were placed in this encounter.   Controlled Substance Prescriptions:  [place] Controlled Substance Registry consulted? Not Applicable  NOTE: This note was prepared using Dragon dictation software along with smaller phrase technology. Despite my best ability to proofread, there is the potential that transcriptional errors may still occur from this process, and are completely unintentional.     Verlee Monte, NP 12/09/18 1812

## 2018-12-09 NOTE — Discharge Instructions (Signed)
Please seek medical attention for any high fevers, chest pain, shortness of breath, change in behavior, persistent vomiting, bloody stool or any other new or concerning symptoms.  

## 2018-12-18 ENCOUNTER — Other Ambulatory Visit: Payer: Self-pay | Admitting: Internal Medicine

## 2018-12-18 ENCOUNTER — Other Ambulatory Visit: Payer: Self-pay

## 2018-12-18 ENCOUNTER — Telehealth: Payer: Self-pay

## 2018-12-18 DIAGNOSIS — B029 Zoster without complications: Secondary | ICD-10-CM

## 2018-12-18 MED ORDER — GABAPENTIN 100 MG PO CAPS: 100.0000 mg | ORAL_CAPSULE | Freq: Three times a day (TID) | ORAL | 1 refills | Status: DC

## 2018-12-18 NOTE — Telephone Encounter (Signed)
I recommend trying gabapentin - one three times a day and watch for sedation.  Can also take 1/2 of oxycodone at night to help with pain/sleep.

## 2018-12-18 NOTE — Telephone Encounter (Signed)
Patient called saying she is having extreme pain from her shingles. Seen ED on 05/09 for shingles. Was given 20 tabs of oxycodone and valacyclovir. Wanted to know if there is anything else she can take for this or any creams OTC.  Please advise.

## 2018-12-18 NOTE — Telephone Encounter (Signed)
Patient informed and told sent to pharmacy.

## 2018-12-20 ENCOUNTER — Emergency Department
Admission: EM | Admit: 2018-12-20 | Discharge: 2018-12-20 | Disposition: A | Discharge status: Home / Self Care | Payer: Medicare Other | Attending: Emergency Medicine | Admitting: Emergency Medicine

## 2018-12-20 ENCOUNTER — Other Ambulatory Visit: Payer: Self-pay

## 2018-12-20 ENCOUNTER — Emergency Department: Payer: Medicare Other

## 2018-12-20 DIAGNOSIS — B028 Zoster with other complications: Secondary | ICD-10-CM | POA: Diagnosis not present

## 2018-12-20 DIAGNOSIS — Z79899 Other long term (current) drug therapy: Secondary | ICD-10-CM | POA: Diagnosis not present

## 2018-12-20 DIAGNOSIS — Z87891 Personal history of nicotine dependence: Secondary | ICD-10-CM | POA: Diagnosis not present

## 2018-12-20 DIAGNOSIS — R109 Unspecified abdominal pain: Secondary | ICD-10-CM | POA: Diagnosis present

## 2018-12-20 DIAGNOSIS — Z7984 Long term (current) use of oral hypoglycemic drugs: Secondary | ICD-10-CM | POA: Diagnosis not present

## 2018-12-20 DIAGNOSIS — E119 Type 2 diabetes mellitus without complications: Secondary | ICD-10-CM | POA: Insufficient documentation

## 2018-12-20 DIAGNOSIS — E11319 Type 2 diabetes mellitus with unspecified diabetic retinopathy without macular edema: Secondary | ICD-10-CM | POA: Insufficient documentation

## 2018-12-20 LAB — CBC
HCT: 37.2 % (ref 36.0–46.0)
Hemoglobin: 12.5 g/dL (ref 12.0–15.0)
MCH: 30.9 pg (ref 26.0–34.0)
MCHC: 33.6 g/dL (ref 30.0–36.0)
MCV: 91.9 fL (ref 80.0–100.0)
Platelets: 330 10*3/uL (ref 150–400)
RBC: 4.05 MIL/uL (ref 3.87–5.11)
RDW: 12.6 % (ref 11.5–15.5)
WBC: 6.4 10*3/uL (ref 4.0–10.5)
nRBC: 0 % (ref 0.0–0.2)

## 2018-12-20 LAB — COMPREHENSIVE METABOLIC PANEL
ALT: 12 U/L (ref 0–44)
AST: 14 U/L — ABNORMAL LOW (ref 15–41)
Albumin: 3.6 g/dL (ref 3.5–5.0)
Alkaline Phosphatase: 61 U/L (ref 38–126)
Anion gap: 9 (ref 5–15)
BUN: 14 mg/dL (ref 8–23)
CO2: 24 mmol/L (ref 22–32)
Calcium: 8.9 mg/dL (ref 8.9–10.3)
Chloride: 102 mmol/L (ref 98–111)
Creatinine, Ser: 0.52 mg/dL (ref 0.44–1.00)
GFR calc Af Amer: >60 mL/min (ref >60)
GFR calc non Af Amer: >60 mL/min (ref >60)
Glucose, Bld: 123 mg/dL — ABNORMAL HIGH (ref 70–99)
Potassium: 4 mmol/L (ref 3.5–5.1)
Sodium: 135 mmol/L (ref 135–145)
Total Bilirubin: 0.8 mg/dL (ref 0.3–1.2)
Total Protein: 7.6 g/dL (ref 6.5–8.1)

## 2018-12-20 LAB — URINALYSIS, COMPLETE (UACMP) WITH MICROSCOPIC
Bacteria, UA: NONE SEEN
Bilirubin Urine: NEGATIVE
Glucose, UA: NEGATIVE mg/dL
Hgb urine dipstick: NEGATIVE
Ketones, ur: 20 mg/dL — AB
Leukocytes,Ua: NEGATIVE
Nitrite: NEGATIVE
Protein, ur: NEGATIVE mg/dL
Specific Gravity, Urine: 1.01 (ref 1.005–1.030)
pH: 6 (ref 5.0–8.0)

## 2018-12-20 LAB — TROPONIN I: Troponin I: <0.03 ng/mL (ref <0.03)

## 2018-12-20 LAB — LIPASE, BLOOD: Lipase: 34 U/L (ref 11–51)

## 2018-12-20 MED ORDER — MORPHINE SULFATE (PF) 4 MG/ML IV SOLN
4.0000 mg | Freq: Once | INTRAVENOUS | Status: AC
  Administered 2018-12-20: 19:00:00 4 mg via INTRAVENOUS

## 2018-12-20 MED ORDER — ONDANSETRON HCL 4 MG/2ML IJ SOLN
INTRAMUSCULAR | Status: AC
  Administered 2018-12-20: 18:00:00 4 mg via INTRAVENOUS
  Filled 2018-12-20: qty 2

## 2018-12-20 MED ORDER — ONDANSETRON HCL 4 MG/2ML IJ SOLN
4.0000 mg | Freq: Once | INTRAMUSCULAR | Status: AC
  Administered 2018-12-20: 18:00:00 4 mg via INTRAVENOUS
  Filled 2018-12-20: qty 2

## 2018-12-20 MED ORDER — MORPHINE SULFATE (PF) 4 MG/ML IV SOLN
INTRAVENOUS | Status: AC
  Filled 2018-12-20: qty 1

## 2018-12-20 MED ORDER — ONDANSETRON 4 MG PO TBDP: 4.0000 mg | ORAL_TABLET | Freq: Three times a day (TID) | ORAL | 0 refills | Status: DC | PRN

## 2018-12-20 MED ORDER — ONDANSETRON HCL 4 MG/2ML IJ SOLN
4.0000 mg | Freq: Once | INTRAMUSCULAR | Status: AC
  Administered 2018-12-20: 18:00:00 4 mg via INTRAVENOUS

## 2018-12-20 MED ORDER — SODIUM CHLORIDE 0.9 % IV BOLUS
1000.0000 mL | Freq: Once | INTRAVENOUS | Status: AC
  Administered 2018-12-20: 18:00:00 1000 mL via INTRAVENOUS

## 2018-12-20 MED ORDER — IOHEXOL 300 MG/ML  SOLN
100.0000 mL | Freq: Once | INTRAMUSCULAR | Status: AC | PRN
  Administered 2018-12-20: 19:00:00 100 mL via INTRAVENOUS

## 2018-12-20 MED ORDER — OXYCODONE-ACETAMINOPHEN 5-325 MG PO TABS
1.0000 | ORAL_TABLET | Freq: Once | ORAL | Status: AC
  Administered 2018-12-20: 22:00:00 1 via ORAL
  Filled 2018-12-20: qty 1

## 2018-12-20 MED ORDER — OXYCODONE-ACETAMINOPHEN 5-325 MG PO TABS: 1.0000 | ORAL_TABLET | ORAL | 0 refills | Status: DC | PRN

## 2018-12-20 NOTE — ED Notes (Signed)
Pt has shingles to peri area, L side, and back. Appear scabbed not currently oozing/open.

## 2018-12-20 NOTE — ED Notes (Signed)
Pt given drink as requested; verbal okay from EDP.

## 2018-12-20 NOTE — ED Notes (Signed)
(  [phone] Lyda Jester son updated briefly. Family requesting pt be observed overnight as they've watched her condition decline at home over the last few weeks. Family states pt has been in so much pain/discomfort/nauseous at home that she has barely been eating/drinking and has lost 10 lbs in last 2-3 weeks. Will notify EDP.

## 2018-12-20 NOTE — ED Notes (Signed)
Droplet/contact sign attached to room door d/t shingles.

## 2018-12-20 NOTE — ED Provider Notes (Signed)
[facility] Emergency Department Provider Note  Time seen: 4:41 PM  I have reviewed the triage vital signs and the nursing notes.   HISTORY  Chief Complaint Abdominal Pain   HPI Jordan Montoya is a 85 y.o. female with a past medical history of anxiety, hyperlipidemia, depression, diabetes, chronic hip pain, presents to the emergency department for abdominal discomfort nausea and lack of appetite.  According to the patient for the past 3 weeks she has been experiencing abdominal pain worse across the lower abdomen, nausea, intermittent vomiting has been feeling weak.  Patient was seen approximately 2 weeks ago and diagnosed with shingles and placed on antivirals and pain medication.  Patient states despite this she has continued to have abdominal pain in fact has worsened over the past 2 to 3 days, she has no appetite and states she is feeling very weak.  Denies any known fever.  Denies any cough congestion or shortness of breath.   Past Medical History:  Diagnosis Date  . Allergy   . Anxiety   . Avitaminosis D 11/25/2014  . Environmental and seasonal allergies 05/30/2015  . HLD (hyperlipidemia) 08/20/2011   Overview:  LDL goal <100 given DM2   . 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 Psoriasis 11/25/2014   followed by Dermatology   . Type 2 diabetes mellitus with hyperosmolarity without coma, without long-term current use of insulin (HCC) 09/30/2015    Patient Active Problem List   Diagnosis Date Noted  . Anxiety disorder 11/16/2018  . Primary osteoarthritis of both hands 08/16/2018  . Arthralgia of multiple sites 05/18/2018  . Pernicious anemia 12/29/2017  . 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 Environmental and seasonal allergies 05/30/2015  . Hip pain, chronic 01/27/2015  . 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 Avitaminosis  D 11/25/2014  . Psoriasis 11/25/2014  . Hyperlipidemia associated with type 2 diabetes mellitus (HCC) 08/20/2011    Past Surgical History:  Procedure Laterality Date  . BREAST BIOPSY Left    neg-bx/clip  . BREAST CYST EXCISION Left    neg  . EYE SURGERY Bilateral    cataracts  . PARTIAL HYSTERECTOMY     one ovary remains    Prior to Admission medications   Medication Sig Start Date End Date Taking? Authorizing Provider  acetaminophen (TYLENOL) 650 MG CR tablet Take 650 mg by mouth every 8 (eight) hours as needed for pain.     [provider]  ALPRAZolam Prudy Feeler) 0.25 MG tablet Take 1 tablet (0.25 mg total) by mouth daily as needed for anxiety. 11/16/18   Reubin Milan, MD  conjugated estrogens (PREMARIN) vaginal cream Place 0.25 Applicatorfuls vaginally 3 (three) times a week. 11/17/18   Reubin Milan, MD  COSENTYX SENSOREADY 300 DOSE 150 MG/ML SOAJ Inject 600 mg into the skin every 30 (thirty) days. 01/26/16   [provider]  docusate sodium (STOOL SOFTENER) 250 MG capsule Take 250 mg by mouth daily.    [provider]  ferrous sulfate 325 (65 FE) MG tablet Take 325 mg by mouth as directed.     [provider]  fluticasone (FLONASE) 50 MCG/ACT nasal spray PLACE 2 SPRAYS INTO BOTH NOSTRILS DAILY. 03/02/17   Reubin Milan, MD  gabapentin (NEURONTIN) 100 MG capsule Take 1 capsule (100 mg total) by mouth 3 (three) times daily. 12/18/18   Reubin Milan, MD  Garlic  1000 MG CAPS Take 1 capsule by mouth daily.     [provider]  glimepiride (AMARYL) 1 MG tablet Take 1 tablet (1 mg total) by mouth daily with breakfast. 10/20/18   Reubin MilanBerglund, Laura H, MD  JANUVIA 100 MG tablet TAKE 1 TABLET (100 MG TOTAL) BY MOUTH DAILY. 03/15/18   Reubin MilanBerglund, Laura H, MD  meclizine (ANTIVERT) 12.5 MG tablet Take 12.5 mg by mouth 3 (three) times daily as needed for dizziness.    [provider]  meloxicam (MOBIC) 7.5 MG tablet Take 1 tablet by mouth  daily. Take with food 08/16/18   [provider]  metoprolol succinate (TOPROL-XL) 25 MG 24 hr tablet Take 25 mg by mouth daily.     [provider]  oxyCODONE-acetaminophen (PERCOCET) 5-325 MG tablet Take 1 tablet by mouth every 4 (four) hours as needed for severe pain. 12/09/18 12/09/19  Phineas SemenGoodman, Graydon, MD  PARoxetine (PAXIL) 30 MG tablet TAKE 1 TABLET BY MOUTH EVERY DAY Patient taking differently: Take 30 mg by mouth daily.  07/24/18   Reubin MilanBerglund, Laura H, MD  polyethylene glycol (MIRALAX) 17 g packet Take 17 g by mouth daily. 12/09/18   Phineas SemenGoodman, Graydon, MD  traMADol (ULTRAM) 50 MG tablet Take 1 tablet (50 mg total) by mouth every 6 (six) hours as needed for severe pain. 12/05/18   Reubin MilanBerglund, Laura H, MD  Triamcinolone Acetonide (TRIAMCINOLONE 0.1 % CREAM : EUCERIN) CREA Apply 1 application topically daily.     [provider]    Allergies  Allergen Reactions  . Metformin Nausea Only    Family History  Problem Relation Age of Onset  . Cancer Mother   . Heart disease Father   . Diabetes Son   . Breast cancer Neg Hx     Social History Social History   Tobacco Use  . Smoking status: Former Smoker    Packs/day: 0.25    Years: 20.00    Pack years: 5.00    Types: Cigarettes  . Smokeless tobacco: Never Used  Substance Use Topics  . Alcohol use: No    Alcohol/week: 0.0 standard drinks  . Drug use: No    Review of Systems Constitutional: Negative for fever. Cardiovascular: Negative for chest pain. Respiratory: Negative for shortness of breath. Gastrointestinal: Positive for abdominal pain worse across the lower abdomen specifically left lower quadrant.  Positive for nausea and intermittent vomiting.  Positive for constipation and negative for diarrhea Genitourinary: Negative for urinary compaints Musculoskeletal: Negative for musculoskeletal complaints Skin: Rash to left buttocks/lower back consistent with shingles Neurological: Negative for headache All  other ROS negative  ____________________________________________   PHYSICAL EXAM:  VITAL SIGNS: ED Triage Vitals  Enc Vitals Group     BP 12/20/18 1546 (!) 181/71     Pulse Rate 12/20/18 1553 76     Resp 12/20/18 1553 14     Temp 12/20/18 1553 98.5 F (36.9 C)     Temp Source 12/20/18 1553 Oral     SpO2 12/20/18 1553 97 %     Weight 12/20/18 1554 155 lb (70.3 kg)     Height 12/20/18 1554 5\' 6"  (1.676 m)     Head Circumference --      Peak Flow --      Pain Score 12/20/18 1554 5     Pain Loc --      Pain Edu? --      Excl. in GC? --    Constitutional: Alert and oriented. Well appearing and in no  distress. Eyes: Normal exam ENT      Head: Normocephalic and atraumatic      Mouth/Throat: Mucous membranes are moist. Cardiovascular: Normal rate, regular rhythm.  Respiratory: Normal respiratory effort without tachypnea nor retractions. Breath sounds are clear  Gastrointestinal: Soft, moderate left lower quadrant tenderness, no rebound guarding or distention. Musculoskeletal: Nontender with normal range of motion in all extremities. Neurologic:  Normal speech and language. No gross focal neurologic deficits  Skin: Patient has a scabbing rash over her left buttock and lower back.  Most consistent with shingles does not appear to cross the midline and appears to follow a dermatomal pattern. Psychiatric: Mood and affect are normal.  ____________________________________________     RADIOLOGY   IMPRESSION: 1. Subtle new ventral lower abdominal wall skin thickening and subcutaneous stranding on series 2, image 73. Recommend correlation with physical exam. 2. Otherwise stable CT Abdomen and Pelvis from that earlier this month with no acute or inflammatory process identified. - large gallstone re- identified, but no CT evidence of acute cholecystitis. If there is right upper quadrant pain recommend follow-up Right Upper Quadrant Ultrasound. - sigmoid diverticulosis. Normal  appendix. 3. Right lower lobe lung nodule redemonstrated (see prior for follow-up recommendations). Aortic Atherosclerosis (ICD10-I70.0).  ____________________________________________   INITIAL IMPRESSION / ASSESSMENT AND PLAN / ED COURSE  Pertinent labs & imaging results that were available during my care of the patient were reviewed by me and considered in my medical decision making (see chart for details).   Patient presents to the emergency department for generalized fatigue weakness, lower abdominal pain.  Patient has been evaluated in the emergency department for these complaints in the past, patient is prescribed antivirals for shingles, she has a rash in her left buttock/lower back consistent with herpes zoster.  Patient states she has been experiencing abdominal pain for the past 3 weeks specifically in the left lower quadrant.  Patient states it has worsened over the past 2 to 3 days.  On my examination patient does have moderate tenderness to this area.  Differential at this time would include colitis, diverticulitis, urinary tract infection, ureterolithiasis.  We will check labs, proceed with repeat CT imaging as it has been nearly 2 weeks since her last abdominal CT and the patient specifically states the pain is worsened over the past 2 to 3 days.  We will IV hydrate, treat with Zofran and continue to closely monitor while awaiting results.  Patient's work-up is essentially nonrevealing, mild dehydration with mild mild ketones in her urinalysis.  Patient CT scan shows no acute intra-abdominal pathology although there is some skin thickening in the lower abdomen, believe this likely correlates to the patient's herpes zoster outbreak currently.  I believe most of the patient symptoms are probably related to the herpes zoster outbreak including decreased appetite, patient states she has not been able to sleep at home because of the pain.  Patient denies taking anything for pain at home.  We  will place the patient on pain medication, we will discharge with a nausea medication.  I discussed the plan of care with the patient as well as the patient's son via phone.  They are agreeable to this plan of care.  Patient will follow-up with her doctor.  Ruqaya Strauss was evaluated in Emergency Department on 12/20/2018 for the symptoms described in the history of present illness. She was evaluated in the context of the global COVID-19 pandemic, which necessitated consideration that the patient might be at risk for infection with  the SARS-CoV-2 virus that causes COVID-19. Institutional protocols and algorithms that pertain to the evaluation of patients at risk for COVID-19 are in a state of rapid change based on information released by regulatory bodies including the CDC and federal and state organizations. These policies and algorithms were followed during the patient's care in the ED.  ____________________________________________   FINAL CLINICAL IMPRESSION(S) / ED DIAGNOSES  Abdominal pain Herpes zoster   Minna Antis, MD 12/20/18 2138

## 2018-12-20 NOTE — ED Triage Notes (Signed)
Pt in via EMS from home d/t abd pain x days and new N/V. Pt has recently been to ER for same reason. Recent dx of diverticulitis. 97.6 oral/96 % RA/ 184/74 BP/BG 125 per EMS; 20g L ac place by EMS; 4 Zofran given by EMS; no abnormalities noted on EMS EKG.

## 2018-12-20 NOTE — ED Notes (Signed)
EDP to bedside. 

## 2018-12-20 NOTE — ED Notes (Signed)
Pt gives permission to update son Lyda Jester at [phone].

## 2018-12-20 NOTE — ED Notes (Signed)
Pt given fresh warm blankets.  

## 2018-12-20 NOTE — ED Notes (Signed)
Dec appetite, hasn't been able to keep food/water down, sweats/chills/weakness/fatigue at home per granddaughter (nurse from [facility]). Recently on amoxicillin but finished it.

## 2018-12-20 NOTE — ED Notes (Signed)
Pt assisted to/from bed/toilet. Urine sample collected and sent to lab.

## 2018-12-20 NOTE — ED Notes (Signed)
Non-slip socks placed on pt's feet.

## 2019-01-04 ENCOUNTER — Telehealth: Payer: Self-pay | Admitting: Internal Medicine

## 2019-01-04 ENCOUNTER — Other Ambulatory Visit: Payer: Self-pay | Admitting: Internal Medicine

## 2019-01-04 ENCOUNTER — Telehealth: Payer: Self-pay

## 2019-01-04 DIAGNOSIS — B029 Zoster without complications: Secondary | ICD-10-CM

## 2019-01-04 MED ORDER — OXYCODONE-ACETAMINOPHEN 5-325 MG PO TABS: 1.0000 | ORAL_TABLET | Freq: Three times a day (TID) | ORAL | 0 refills | Status: DC | PRN

## 2019-01-04 NOTE — Telephone Encounter (Signed)
Patient called she said she left a voice mail with you in wanting refill for codiene to help her sleep she's taking tylenol now, she also mention that she was prescribed the medicine from the ER doctor in [facility]. I said to her she would have to reach back to the doctor who had prescribed the med's for her, if not then she needed to make an appointment with [place].

## 2019-01-04 NOTE — Telephone Encounter (Signed)
Patient called saying she is running out of oxycodone from ER for her shingles. She only uses this at bedtime to help her sleep. She uses tylenol all day otherwise  Please advise. Patient did not get this from [place] but ER for shingles.

## 2019-01-04 NOTE — Telephone Encounter (Signed)
I sent her #[phone].  She will need to make this last as long as possible and stop it as soon as her pain levels decrease.  If she needs more medication for pain, she will need to come see me.

## 2019-01-05 ENCOUNTER — Ambulatory Visit: Payer: Medicare Other | Admitting: Internal Medicine

## 2019-01-05 NOTE — Telephone Encounter (Signed)
Patient informed. 

## 2019-01-05 NOTE — Telephone Encounter (Signed)
Medication sent in for patient for percocet. Patient aware.

## 2019-01-10 ENCOUNTER — Telehealth: Payer: Self-pay

## 2019-01-10 NOTE — Telephone Encounter (Signed)
Patient cancelled appt due to Shingles. She states she has them on her Left side and is afraid she can not get here to be seen for her CPE. Advised she may need OV and she asked to have Chassidy call her first to discuss symptoms.

## 2019-01-10 NOTE — Telephone Encounter (Signed)
Called and spoke with patient. She is at the drying up stage of shingles and hurts the worst on the "inside".Told her to continue percocet at night to help her sleep.   Tried Gabapentin and it made her feel sluggish and " not right." She is not taking that anymore but taking valacyclovir and percocet, with tylenol during the day.   Told her to continue this and hopefully the rash will dry up completely soon. We will reschedule her CPE when she is better.

## 2019-01-12 ENCOUNTER — Encounter: Payer: Self-pay | Admitting: Internal Medicine

## 2019-01-12 ENCOUNTER — Other Ambulatory Visit: Payer: Self-pay

## 2019-01-12 ENCOUNTER — Ambulatory Visit: Payer: Medicare Other | Admitting: Internal Medicine

## 2019-01-12 DIAGNOSIS — B029 Zoster without complications: Secondary | ICD-10-CM

## 2019-01-12 MED ORDER — OXYCODONE-ACETAMINOPHEN 5-325 MG PO TABS: 1.0000 | ORAL_TABLET | Freq: Three times a day (TID) | ORAL | 0 refills | Status: DC | PRN

## 2019-01-12 MED ORDER — PREGABALIN 50 MG PO CAPS: 50.0000 mg | ORAL_CAPSULE | Freq: Three times a day (TID) | ORAL | 0 refills | Status: DC

## 2019-01-12 NOTE — Patient Instructions (Signed)
Start Lyrica 50 mg in the morning only to be sure its tolerated.  Increase to three times a day over the next few day if tolerated.  Take tylenol during the day for pain.  Take frequent deep breaths and move around as much as you can during the day.  Use protein shakes or supplements in between meals.

## 2019-01-12 NOTE — Progress Notes (Signed)
Date:  01/12/2019   Name:  Jordan Montoya   DOB:  Jun 30, 1934   MRN:  [id number]   Chief Complaint: No chief complaint on file.  Zoster/PHN - pt was diagnosed with Zoster on 12/09/18 after three weeks of abdominal pain for which no etiology was found.  She was discharged on Valtrex 1000 mg tid and percocet for pain.  On 12/18/18 she called with ongoing severe pain and was prescribed gabapentin 100 mg tid by me. Two days later on 12/20/18 she went back to the ED with abdominal pain - CT: IMPRESSION: 1. Subtle new ventral lower abdominal wall skin thickening and subcutaneous stranding on series 2, image 73. Recommend correlation with physical exam. 2. Otherwise stable CT Abdomen and Pelvis from that earlier this month with no acute or inflammatory process identified. - large gallstone re- identified, but no CT evidence of acute cholecystitis. If there is right upper quadrant pain recommend follow-up Right Upper Quadrant Ultrasound. - sigmoid diverticulosis. Normal appendix. -She was discharged home on zofran and percocet. Since then she has continued to have pain.  She received another percocet Rx on 01/04/19 from me and is here today with ongoing pain and need for narcotics.  She reports that she is not taking gabapentin at this time due to severe side effects of extreme anxiety.  Review of Systems  Constitutional: Positive for unexpected weight change. Negative for chills, fatigue and fever.  Respiratory: Negative for chest tightness and shortness of breath.   Cardiovascular: Negative for chest pain, palpitations and leg swelling.  Gastrointestinal: Positive for abdominal pain and nausea. Negative for blood in stool, constipation, diarrhea and rectal pain.  Genitourinary: Negative for dysuria and urgency.  Skin: Positive for rash.  Allergic/Immunologic: Negative for environmental allergies.  Neurological: Negative for dizziness and headaches.  Psychiatric/Behavioral: Positive for  dysphoric mood and sleep disturbance.    Patient Active Problem List   Diagnosis Date Noted  . Anxiety disorder 11/16/2018  . Primary osteoarthritis of both hands 08/16/2018  . Arthralgia of multiple sites 05/18/2018  . Pernicious anemia 12/29/2017  . DM type 2 with diabetic background retinopathy ([place]) 09/30/2015  . Environmental and seasonal allergies 05/30/2015  . Hip pain, chronic 01/27/2015  . Major depressive disorder with single episode, in partial remission ([place]) 11/25/2014  . Avitaminosis D 11/25/2014  . Psoriasis 11/25/2014  . Hyperlipidemia associated with type 2 diabetes mellitus ([place]) 08/20/2011    Allergies  Allergen Reactions  . Metformin Nausea Only    Past Surgical History:  Procedure Laterality Date  . BREAST BIOPSY Left    neg-bx/clip  . BREAST CYST EXCISION Left    neg  . EYE SURGERY Bilateral    cataracts  . PARTIAL HYSTERECTOMY     one ovary remains    Social History   Tobacco Use  . Smoking status: Former Smoker    Packs/day: 0.25    Years: 20.00    Pack years: 5.00    Types: Cigarettes  . Smokeless tobacco: Never Used  Substance Use Topics  . Alcohol use: No    Alcohol/week: 0.0 standard drinks  . Drug use: No     Medication list has been reviewed and updated.  Current Meds  Medication Sig  . acetaminophen (TYLENOL) 650 MG CR tablet Take 650 mg by mouth every 8 (eight) hours as needed for pain.   Marland Kitchen ALPRAZolam (XANAX) 0.25 MG tablet Take 1 tablet (0.25 mg total) by mouth daily as needed for anxiety.  . conjugated  estrogens (PREMARIN) vaginal cream Place 0.25 Applicatorfuls vaginally 3 (three) times a week.  Lorenso Quarry. COSENTYX SENSOREADY 300 DOSE 150 MG/ML SOAJ Inject 600 mg into the skin every 30 (thirty) days.  Marland Kitchen. docusate sodium (STOOL SOFTENER) 250 MG capsule Take 250 mg by mouth daily.  . Garlic 1000 MG CAPS Take 1 capsule by mouth daily.   Marland Kitchen. glimepiride (AMARYL) 1 MG tablet Take 1 tablet (1 mg total) by mouth daily with breakfast.  .  JANUVIA 100 MG tablet TAKE 1 TABLET (100 MG TOTAL) BY MOUTH DAILY.  . meclizine (ANTIVERT) 12.5 MG tablet Take 12.5 mg by mouth 3 (three) times daily as needed for dizziness.  . meloxicam (MOBIC) 7.5 MG tablet Take 1 tablet by mouth daily. Take with food  . metoprolol succinate (TOPROL-XL) 25 MG 24 hr tablet Take 25 mg by mouth daily.   . ondansetron (ZOFRAN ODT) 4 MG disintegrating tablet Take 1 tablet (4 mg total) by mouth every 8 (eight) hours as needed for nausea or vomiting.  . polyethylene glycol (MIRALAX) 17 g packet Take 17 g by mouth daily.  . Triamcinolone Acetonide (TRIAMCINOLONE 0.1 % CREAM : EUCERIN) CREA Apply 1 application topically daily.   . valACYclovir (VALTREX) 1000 MG tablet Take 1,000 mg by mouth daily.    PHQ 2/9 Scores 12/05/2018 11/16/2018 10/16/2018 05/18/2018  PHQ - 2 Score 3 1 0 0  PHQ- 9 Score 12 6 6 2     BP Readings from Last 3 Encounters:  01/12/19 (!) 142/81  12/20/18 (!) 194/72  12/09/18 (!) 155/59    Physical Exam Vitals signs and nursing note reviewed.  Constitutional:      General: She is in acute distress.     Appearance: She is well-developed.  HENT:     Head: Normocephalic and atraumatic.  Cardiovascular:     Rate and Rhythm: Normal rate and regular rhythm.     Heart sounds: No murmur.  Pulmonary:     Effort: Pulmonary effort is normal. No respiratory distress.     Breath sounds: No wheezing or rhonchi.  Abdominal:     Tenderness: There is abdominal tenderness.  Musculoskeletal: Normal range of motion.     Right lower leg: No edema.     Left lower leg: No edema.  Skin:    General: Skin is warm and dry.     Capillary Refill: Capillary refill takes less than 2 seconds.     Findings: Rash present.          Comments: Extensive crusting from healing zoster vesicles  Neurological:     General: No focal deficit present.     Mental Status: She is alert and oriented to person, place, and time.  Psychiatric:        Behavior: Behavior normal.         Thought Content: Thought content normal.     Wt Readings from Last 3 Encounters:  01/12/19 155 lb (70.3 kg)  12/20/18 155 lb (70.3 kg)  12/09/18 163 lb (73.9 kg)    BP (!) 142/81   Pulse 89   Resp 16   Ht 5\' 6"  (1.676 m)   Wt 155 lb (70.3 kg)   SpO2 98%   BMI 25.02 kg/m   Assessment and Plan: 1. Herpes zoster without complication Intolerant of gabapentin Will try Lyrica Refill Percocet Increase fluids and supplement diet with Ensure or protein shakes - pregabalin (LYRICA) 50 MG capsule; Take 1 capsule (50 mg total) by mouth 3 (three) times daily.  Dispense: 90 capsule; Refill: 0 - oxyCODONE-acetaminophen (PERCOCET) 5-325 MG tablet; Take 1 tablet by mouth every 8 (eight) hours as needed for up to 20 days for severe pain.  Dispense: 60 tablet; Refill: 0   Partially dictated using Animal nutritionistDragon software. Any errors are unintentional.  Bari EdwardLaura Al Bracewell, MD [facility]  01/12/2019

## 2019-01-23 ENCOUNTER — Telehealth: Payer: Self-pay | Admitting: Internal Medicine

## 2019-01-23 ENCOUNTER — Telehealth: Payer: Self-pay

## 2019-01-23 MED ORDER — AMOXICILLIN-POT CLAVULANATE 875-125 MG PO TABS: 1.0000 | ORAL_TABLET | Freq: Two times a day (BID) | ORAL | 0 refills | Status: DC

## 2019-01-23 NOTE — Telephone Encounter (Signed)
Spoke with pt and documented call. Thank you.

## 2019-01-23 NOTE — Telephone Encounter (Signed)
Pt in pain, something to do with diverticulitis? Requesting a call from you.

## 2019-01-23 NOTE — Telephone Encounter (Signed)
Patient called saying she is having a diverticulitis flare. She is have sever stomach pains. Not on the side of her shingles.   Shingles is better with Lyrica.  She has not had a BM today but had one twice yesterday and both were runny and loose.   Sent in Augmentin with Dr. Army Melia instruction to. Patient informed.

## 2019-01-25 ENCOUNTER — Telehealth: Payer: Self-pay

## 2019-01-25 NOTE — Telephone Encounter (Signed)
Patient called saying she is having severe pain in her stomach. We prescribed amoxicillin for this. She wants to know how long this should take effect to help her pains?  She said "I can't take this anymore."  Please Advise.

## 2019-01-25 NOTE — Telephone Encounter (Signed)
If the pains are severe, she will need to go to the ED.  If she is able to eat and drink normally without vomiting and she does not have high fever, profuse diarrhea or bleeding, then she needs to continue the antibiotics.

## 2019-01-26 ENCOUNTER — Other Ambulatory Visit: Payer: Self-pay | Admitting: Internal Medicine

## 2019-01-26 DIAGNOSIS — F32A Depression, unspecified: Secondary | ICD-10-CM

## 2019-01-26 DIAGNOSIS — F329 Major depressive disorder, single episode, unspecified: Secondary | ICD-10-CM

## 2019-01-26 NOTE — Telephone Encounter (Signed)
Called and left VM informing patient of this exact note. Told her to callback if she has any other questions. Go to ER if symptoms are severe, otherwise continue taking abx and complete the full course.

## 2019-01-29 ENCOUNTER — Telehealth: Payer: Self-pay

## 2019-01-29 NOTE — Telephone Encounter (Signed)
Patient called leaving VM stating she wanted to inform Dr. Army Melia that Dr. Phillip Heal is putting her on prednisone for 10 days for shingles.   FYI.

## 2019-02-01 ENCOUNTER — Encounter: Payer: Self-pay | Admitting: Emergency Medicine

## 2019-02-01 ENCOUNTER — Other Ambulatory Visit: Payer: Self-pay

## 2019-02-01 ENCOUNTER — Ambulatory Visit
Admission: EM | Admit: 2019-02-01 | Discharge: 2019-02-01 | Disposition: A | Discharge status: Nursing Home (Includes ICF) | Payer: Medicare Other | Attending: Family Medicine | Admitting: Family Medicine

## 2019-02-01 DIAGNOSIS — B0229 Other postherpetic nervous system involvement: Secondary | ICD-10-CM | POA: Diagnosis not present

## 2019-02-01 DIAGNOSIS — B029 Zoster without complications: Secondary | ICD-10-CM

## 2019-02-01 MED ORDER — HYDROMORPHONE HCL 2 MG PO TABS: 1.0000 mg | ORAL_TABLET | Freq: Three times a day (TID) | ORAL | 0 refills | Status: DC | PRN

## 2019-02-01 MED ORDER — PREGABALIN 100 MG PO CAPS: 100.0000 mg | ORAL_CAPSULE | Freq: Three times a day (TID) | ORAL | 0 refills | Status: DC

## 2019-02-01 NOTE — ED Provider Notes (Addendum)
[facility]    CSN: [id number] Arrival date & time: 02/01/19  [id number]    History   Chief Complaint Chief Complaint  Patient presents with  . Herpes Zoster   HPI  85 year old female presents with severe pain.  Patient has been experiencing pain associated with herpes zoster outbreak.  She was seen for this initially on 5/9.  Patient is being followed by her primary care physician as well as dermatology.  She still has healing areas.  She also has scarring.  Rash is located on her left buttock and extends around anteriorly to the groin and into the genital region.  Patient states that her pain is severe.  He is currently taking Lyrica.  She has taken oxycodone without improvement.  She states that she does not tolerate this medication.  She has also tried tramadol as well as gabapentin.  She continues to have severe pain.  Rates her pain is 10/10 in severity.  No relieving factors.  No other reported symptoms.  No other complaints.  Of note, when asked questions about thoughts of suicide by nursing staff upon arrival, patient states that she has thought about suicide due to the severity of her pain.  States that she is thought about taking too many oxycodone.  She currently has no plan to harm herself.  There are no firearms in the home as it has been removed from the home.  Friend is with her today acknowledges that she is not feel that she is a harm to herself.  She is just in excruciating pain.  Hx reviewed as below. Past Medical History:  Diagnosis Date  . Allergy   . Anxiety   . Avitaminosis D 11/25/2014  . Environmental and seasonal allergies 05/30/2015  . HLD (hyperlipidemia) 08/20/2011   Overview:  LDL goal <100 given DM2   . 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 Psoriasis 11/25/2014   followed by Dermatology   . Type 2 diabetes mellitus with hyperosmolarity without coma, without long-term current use of insulin (HCC)  09/30/2015  Post herpetic neuralgia.  Patient Active Problem List   Diagnosis Date Noted  . Anxiety disorder 11/16/2018  . Primary osteoarthritis of both hands 08/16/2018  . Arthralgia of multiple sites 05/18/2018  . Pernicious anemia 12/29/2017  . 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 Environmental and seasonal allergies 05/30/2015  . Hip pain, chronic 01/27/2015  . 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 Avitaminosis D 11/25/2014  . Psoriasis 11/25/2014  . Hyperlipidemia associated with type 2 diabetes mellitus (HCC) 08/20/2011    Past Surgical History:  Procedure Laterality Date  . BREAST BIOPSY Left    neg-bx/clip  . BREAST CYST EXCISION Left    neg  . EYE SURGERY Bilateral    cataracts  . PARTIAL HYSTERECTOMY     one ovary remains    OB History    Gravida  3   Para  3   Term  3   Preterm      AB      Living        SAB      TAB      Ectopic      Multiple      Live Births  1            Home Medications    Prior to Admission medications   Medication Sig Start Date End  Date Taking? Authorizing Provider  acetaminophen (TYLENOL) 650 MG CR tablet Take 650 mg by mouth every 8 (eight) hours as needed for pain.    Yes [provider]  ALPRAZolam (XANAX) 0.25 MG tablet Take 1 tablet (0.25 mg total) by mouth daily as needed for anxiety. 11/16/18  Yes Glean Hess, MD  conjugated estrogens (PREMARIN) vaginal cream Place 3.15 Applicatorfuls vaginally 3 (three) times a week. 11/17/18  Yes Glean Hess, MD  ferrous sulfate 325 (65 FE) MG tablet Take 325 mg by mouth as directed.    Yes [provider]  glimepiride (AMARYL) 1 MG tablet Take 1 tablet (1 mg total) by mouth daily with breakfast. 10/20/18  Yes Glean Hess, MD  JANUVIA 100 MG tablet TAKE 1 TABLET (100 MG TOTAL) BY MOUTH DAILY. 03/15/18  Yes Glean Hess, MD  metoprolol succinate  (TOPROL-XL) 25 MG 24 hr tablet Take 25 mg by mouth daily.    Yes [provider]  COSENTYX SENSOREADY 300 DOSE 150 MG/ML SOAJ Inject 600 mg into the skin every 30 (thirty) days. 01/26/16   [provider]  docusate sodium (STOOL SOFTENER) 250 MG capsule Take 250 mg by mouth daily.    [provider]  Garlic 1761 MG CAPS Take 1 capsule by mouth daily.     [provider]  HYDROmorphone (DILAUDID) 2 MG tablet Take 0.5-1 tablets (1-2 mg total) by mouth every 8 (eight) hours as needed for severe pain. 02/01/19   Coral Spikes, DO  meclizine (ANTIVERT) 12.5 MG tablet Take 12.5 mg by mouth 3 (three) times daily as needed for dizziness.    [provider]  meloxicam (MOBIC) 7.5 MG tablet Take 1 tablet by mouth daily. Take with food 08/16/18   [provider]  ondansetron (ZOFRAN ODT) 4 MG disintegrating tablet Take 1 tablet (4 mg total) by mouth every 8 (eight) hours as needed for nausea or vomiting. 12/20/18   Harvest Dark, MD  PARoxetine (PAXIL) 30 MG tablet TAKE 1 TABLET BY MOUTH EVERY DAY 01/26/19   Glean Hess, MD  polyethylene glycol (MIRALAX) 17 g packet Take 17 g by mouth daily. 12/09/18   Nance Pear, MD  pregabalin (LYRICA) 100 MG capsule Take 1 capsule (100 mg total) by mouth 3 (three) times daily. 02/01/19   Coral Spikes, DO  Triamcinolone Acetonide (TRIAMCINOLONE 0.1 % CREAM : EUCERIN) CREA Apply 1 application topically daily.     [provider]  gabapentin (NEURONTIN) 100 MG capsule Take 1 capsule (100 mg total) by mouth 3 (three) times daily. Patient not taking: Reported on 01/12/2019 12/18/18 02/01/19  Glean Hess, MD    Family History Family History  Problem Relation Age of Onset  . Cancer Mother   . Heart disease Father   . Diabetes Son   . Breast cancer Neg Hx     Social History Social History   Tobacco Use  . Smoking status: Former Smoker    Packs/day: 0.25    Years: 20.00    Pack years: 5.00     Types: Cigarettes  . Smokeless tobacco: Never Used  Substance Use Topics  . Alcohol use: No    Alcohol/week: 0.0 standard drinks  . Drug use: No     Allergies   Metformin   Review of Systems Review of Systems  Constitutional: Negative.   Skin:       Scarring and healing lesions from Herpes zoster.   Physical Exam Triage Vital Signs  ED Triage Vitals  Enc Vitals Group     BP 02/01/19 1010 (!) 162/65     Pulse Rate 02/01/19 1010 68     Resp 02/01/19 1010 18     Temp 02/01/19 1010 98.2 F (36.8 C)     Temp src --      SpO2 02/01/19 1010 96 %     Weight 02/01/19 1014 148 lb (67.1 kg)     Height 02/01/19 1014 5\' 6"  (1.676 m)     Head Circumference --      Peak Flow --      Pain Score 02/01/19 1013 10     Pain Loc --      Pain Edu? --      Excl. in GC? --    Updated Vital Signs BP (!) 162/65 (BP Location: Left Arm)   Pulse 68   Temp 98.2 F (36.8 C)   Resp 18   Ht 5\' 6"  (1.676 m)   Wt 67.1 kg   SpO2 96%   BMI 23.89 kg/m   Visual Acuity Right Eye Distance:   Left Eye Distance:   Bilateral Distance:    Right Eye Near:   Left Eye Near:    Bilateral Near:     Physical Exam Constitutional:      Comments: No apparent distress. Appears in pain.  HENT:     Head: Normocephalic and atraumatic.  Eyes:     General:        Right eye: No discharge.        Left eye: No discharge.     Conjunctiva/sclera: Conjunctivae normal.  Cardiovascular:     Rate and Rhythm: Normal rate and regular rhythm.  Pulmonary:     Effort: Pulmonary effort is normal. No respiratory distress.  Skin:    Comments: Left buttock, left groin, and perineal region with hyperpigmentation and scarring from prior herpes zoster.  Patient also has eschars.   Neurological:     Mental Status: She is alert.  Psychiatric:        Mood and Affect: Mood normal.        Behavior: Behavior normal.    UC Treatments / Results  Labs (all labs ordered are listed, but only abnormal results are displayed)  Labs Reviewed - No data to display  EKG   Radiology No results found.  Procedures Procedures (including critical care time)  Medications Ordered in UC Medications - No data to display  Initial Impression / Assessment and Plan / UC Course  I have reviewed the triage vital signs and the nursing notes.  Pertinent labs & imaging results that were available during my care of the patient were reviewed by me and considered in my medical decision making (see chart for details).    85 year old female presents with postherpetic neuralgia.  Increasing Lyrica.  Stopping all opiates.  Placing on Dilaudid given severity of pain.  Lengthy discussion with the patient as well as her friend regarding side effects particularly sedation and constipation.  Patient is aware of the risks.  [place] controlled substance database reviewed.  Final Clinical Impressions(s) / UC Diagnoses   Final diagnoses:  Post herpetic neuralgia     Discharge Instructions     Medications as prescribed.  You will  likely need to see pain management.  See [facility] on Monday at 3 pm.  Take care  Dr. Adriana Simasook    ED Prescriptions    Medication Sig Dispense Auth. Provider   pregabalin (LYRICA) 100  MG capsule Take 1 capsule (100 mg total) by mouth 3 (three) times daily. 90 capsule Lillis Nuttle G, DO   HYDROmorphone (DILAUDID) 2 MG tablet Take 0.5-1 tablets (1-2 mg total) by mouth every 8 (eight) hours as needed for severe pain. 10 tablet Tommie Samsook, Saqib Cazarez G, DO     Controlled Substance Prescriptions [place] Controlled Substance Registry consulted? Yes, I have consulted the [place] Controlled Substances Registry for this patient, and feel the risk/benefit ratio today is favorable for proceeding with this prescription for a controlled substance.   Tommie SamsCook, Lether Tesch G, DO 02/01/19 1428   >30 minutes were spent face-to-face with the patient during this encounter and over half of that time was spent on counseling regarding her  diagnosis, prognosis, and medication changes as well as potential side effects.  I also spent time coordinating her care by arranging follow-up.    Tommie SamsCook, Alyah Boehning G, [place] 02/01/19 1429

## 2019-02-01 NOTE — ED Triage Notes (Signed)
Patient states she has had shingles since April. Pain is much worse now

## 2019-02-01 NOTE — Progress Notes (Signed)
Patient states she is in so much much pain she has thought of suicide.

## 2019-02-01 NOTE — Discharge Instructions (Signed)
Medications as prescribed.  You will  likely need to see pain management.  See [facility] on Monday at 3 pm.  Take care  Dr. Lacinda Axon

## 2019-02-05 ENCOUNTER — Ambulatory Visit: Payer: Medicare Other | Admitting: Internal Medicine

## 2019-02-05 ENCOUNTER — Telehealth: Payer: Self-pay | Admitting: Internal Medicine

## 2019-02-05 ENCOUNTER — Telehealth: Payer: Self-pay

## 2019-02-05 NOTE — Telephone Encounter (Signed)
That is fine if she is off of percocet.  I am happy to refill the Lyrica when needed.

## 2019-02-05 NOTE — Telephone Encounter (Signed)
Patient called saying she seen UC for shingles pain. They increased Lyrica from 50 mg to 100 mg TID. She said she cannot get anyone to bring her here. Dr. Lacinda Axon is the one who made her follow up appointment that's he canceled today.  She said if she needs  lyrica could we refill it if needed. This is working and she is no longer taking any pain medication as the lyrica does the trick for shingles pain she is still experiencing.  Please Advise.   She said she has no transportation and can never get anyone to bring her here for her appointments right now.

## 2019-02-05 NOTE — Telephone Encounter (Signed)
Pt cannot come in to appt, she has no transportation. She was seen in urgent care and her medication was changed, she doesn't really think she needs to come in and would like to just talk to you over the phone.

## 2019-02-05 NOTE — Telephone Encounter (Signed)
Patient informed. 

## 2019-02-05 NOTE — Telephone Encounter (Signed)
Please let her know if she is doing okay, and she received medications from UC then she should be fine not to come in. We are extremely busy today and I am the only nurse here.   Dr Army Melia cannot refill any more pain medication and she will need to start weaning off of it slowly.  Thank you.

## 2019-02-07 ENCOUNTER — Telehealth: Payer: Self-pay

## 2019-02-07 ENCOUNTER — Other Ambulatory Visit: Payer: Self-pay | Admitting: Internal Medicine

## 2019-02-07 DIAGNOSIS — F411 Generalized anxiety disorder: Secondary | ICD-10-CM

## 2019-02-07 MED ORDER — ALPRAZOLAM 0.25 MG PO TABS: 0.2500 mg | ORAL_TABLET | Freq: Every day | ORAL | 1 refills | Status: DC | PRN

## 2019-02-07 NOTE — Telephone Encounter (Signed)
Patient called to request RF on her anxiety medication- Xanax. Said it helped her to sleep last night. 4-5 hours for the first time in months.  Please Advise.

## 2019-02-09 ENCOUNTER — Telehealth: Payer: Self-pay

## 2019-02-09 NOTE — Telephone Encounter (Signed)
Patient will take Lyrica 400 mg daily. 1 in AM, 1 at lunch, and 2 at bedtime. Patient informed and will try this to help with pain.

## 2019-02-09 NOTE — Telephone Encounter (Signed)
She needs to continue the Lyrica.  She can increase that to 100 mg twice a day.  She can also take oxycodone 1/2 tablet at bedtime so she can sleep but she may have accept some pain during the day.

## 2019-02-09 NOTE — Telephone Encounter (Signed)
Pt called and left VM. She said she was moved up from 50 to 100 mg of Lyrica TID. She said this was helping, but as of yesterday she is starting to have pain again. She said she has duladid, oxycodone, and muscle relaxer's at home.  What should she take? UC told her to call you for advice.

## 2019-02-12 ENCOUNTER — Telehealth: Payer: Self-pay

## 2019-02-12 NOTE — Telephone Encounter (Signed)
Patient called complaining of numbness and loss of feeling under knees down into feet. She fell yesterday on her back door stairs. She is fine just bruised up. Said she she can walk on the flat floor, but cannot raise her feet to walk on any stairs. She could not stand back up after fall and had to scoot on the floor to the phone to call her son for him to come over and help her up.  She wants to know if this could be a side effect of Lyrica?  Please Advise.

## 2019-02-12 NOTE — Telephone Encounter (Signed)
Numbness on her feet and legs should not cause weakness so that she can not lift her legs to use stairs.  It could be from Lyrica but not likely.  I am concerned that she injured her back in the fall and now has leg symptoms of weakness/numbness.  She should probably be seen at the ER for xrays or maybe MRI.

## 2019-02-12 NOTE — Telephone Encounter (Signed)
Spoke with patient and she said she did not get hurt when she fell. Er feet knee down are just numb and feel funny. Hers legs both gave out when she went up the stair and that is why she fell.   Told patient per Dr. Army Melia to go back to Lyrica TID and stop taking the 2 tablets at bed time. The increase in medication may have caused this but we wont know if it is unless she stops the additional dose.   She verbalized understanding. If it does not get better in the week, told her to call the office and she will NEED to be seen in order to discuss this or get any PT Referral that she is requesting.

## 2019-02-26 ENCOUNTER — Other Ambulatory Visit: Payer: Self-pay | Admitting: Internal Medicine

## 2019-02-26 DIAGNOSIS — B029 Zoster without complications: Secondary | ICD-10-CM

## 2019-02-28 ENCOUNTER — Inpatient Hospital Stay
Admission: EM | Admit: 2019-02-28 | Discharge: 2019-03-02 | DRG: 871 | Disposition: A | Discharge status: Home Health | Payer: Medicare Other | Attending: Internal Medicine | Admitting: Internal Medicine

## 2019-02-28 ENCOUNTER — Emergency Department: Payer: Medicare Other

## 2019-02-28 ENCOUNTER — Other Ambulatory Visit: Payer: Self-pay

## 2019-02-28 DIAGNOSIS — A419 Sepsis, unspecified organism: Secondary | ICD-10-CM | POA: Diagnosis present

## 2019-02-28 DIAGNOSIS — I10 Essential (primary) hypertension: Secondary | ICD-10-CM | POA: Diagnosis present

## 2019-02-28 DIAGNOSIS — Z8249 Family history of ischemic heart disease and other diseases of the circulatory system: Secondary | ICD-10-CM

## 2019-02-28 DIAGNOSIS — Z8619 Personal history of other infectious and parasitic diseases: Secondary | ICD-10-CM | POA: Diagnosis not present

## 2019-02-28 DIAGNOSIS — F419 Anxiety disorder, unspecified: Secondary | ICD-10-CM | POA: Diagnosis present

## 2019-02-28 DIAGNOSIS — E119 Type 2 diabetes mellitus without complications: Secondary | ICD-10-CM | POA: Diagnosis present

## 2019-02-28 DIAGNOSIS — Z20828 Contact with and (suspected) exposure to other viral communicable diseases: Secondary | ICD-10-CM | POA: Diagnosis present

## 2019-02-28 DIAGNOSIS — Z7984 Long term (current) use of oral hypoglycemic drugs: Secondary | ICD-10-CM | POA: Diagnosis not present

## 2019-02-28 DIAGNOSIS — E785 Hyperlipidemia, unspecified: Secondary | ICD-10-CM | POA: Diagnosis present

## 2019-02-28 DIAGNOSIS — Z9842 Cataract extraction status, left eye: Secondary | ICD-10-CM | POA: Diagnosis not present

## 2019-02-28 DIAGNOSIS — Z888 Allergy status to other drugs, medicaments and biological substances status: Secondary | ICD-10-CM | POA: Diagnosis not present

## 2019-02-28 DIAGNOSIS — J189 Pneumonia, unspecified organism: Secondary | ICD-10-CM | POA: Diagnosis present

## 2019-02-28 DIAGNOSIS — B0229 Other postherpetic nervous system involvement: Secondary | ICD-10-CM | POA: Diagnosis present

## 2019-02-28 DIAGNOSIS — F329 Major depressive disorder, single episode, unspecified: Secondary | ICD-10-CM | POA: Diagnosis present

## 2019-02-28 DIAGNOSIS — W1830XA Fall on same level, unspecified, initial encounter: Secondary | ICD-10-CM | POA: Diagnosis present

## 2019-02-28 DIAGNOSIS — Z833 Family history of diabetes mellitus: Secondary | ICD-10-CM

## 2019-02-28 DIAGNOSIS — Z9841 Cataract extraction status, right eye: Secondary | ICD-10-CM | POA: Diagnosis not present

## 2019-02-28 DIAGNOSIS — Z79899 Other long term (current) drug therapy: Secondary | ICD-10-CM | POA: Diagnosis not present

## 2019-02-28 DIAGNOSIS — Z90711 Acquired absence of uterus with remaining cervical stump: Secondary | ICD-10-CM | POA: Diagnosis not present

## 2019-02-28 DIAGNOSIS — B029 Zoster without complications: Secondary | ICD-10-CM

## 2019-02-28 DIAGNOSIS — Z791 Long term (current) use of non-steroidal anti-inflammatories (NSAID): Secondary | ICD-10-CM | POA: Diagnosis not present

## 2019-02-28 DIAGNOSIS — Z87891 Personal history of nicotine dependence: Secondary | ICD-10-CM | POA: Diagnosis not present

## 2019-02-28 HISTORY — DX: Sepsis, unspecified organism: A41.9

## 2019-02-28 LAB — GLUCOSE, CAPILLARY
Glucose-Capillary: 132 mg/dL — ABNORMAL HIGH (ref 70–99)
Glucose-Capillary: 84 mg/dL (ref 70–99)
Glucose-Capillary: 94 mg/dL (ref 70–99)
Glucose-Capillary: 107 mg/dL — ABNORMAL HIGH (ref 70–99)

## 2019-02-28 LAB — COMPREHENSIVE METABOLIC PANEL
ALT: 18 U/L (ref 0–44)
AST: 21 U/L (ref 15–41)
Albumin: 4.2 g/dL (ref 3.5–5.0)
Alkaline Phosphatase: 61 U/L (ref 38–126)
Anion gap: 10 (ref 5–15)
BUN: 29 mg/dL — ABNORMAL HIGH (ref 8–23)
CO2: 24 mmol/L (ref 22–32)
Calcium: 9.2 mg/dL (ref 8.9–10.3)
Chloride: 103 mmol/L (ref 98–111)
Creatinine, Ser: 0.72 mg/dL (ref 0.44–1.00)
GFR calc Af Amer: >60 mL/min (ref >60)
GFR calc non Af Amer: >60 mL/min (ref >60)
Glucose, Bld: 155 mg/dL — ABNORMAL HIGH (ref 70–99)
Potassium: 3.6 mmol/L (ref 3.5–5.1)
Sodium: 137 mmol/L (ref 135–145)
Total Bilirubin: 0.9 mg/dL (ref 0.3–1.2)
Total Protein: 7.4 g/dL (ref 6.5–8.1)

## 2019-02-28 LAB — CBC WITH DIFFERENTIAL/PLATELET
Abs Immature Granulocytes: 0.08 10*3/uL — ABNORMAL HIGH (ref 0.00–0.07)
Basophils Absolute: 0 10*3/uL (ref 0.0–0.1)
Basophils Relative: 0 %
Eosinophils Absolute: 0 10*3/uL (ref 0.0–0.5)
Eosinophils Relative: 0 %
HCT: 39 % (ref 36.0–46.0)
Hemoglobin: 13.2 g/dL (ref 12.0–15.0)
Immature Granulocytes: 1 %
Lymphocytes Relative: 7 %
Lymphs Abs: 1 10*3/uL (ref 0.7–4.0)
MCH: 32.2 pg (ref 26.0–34.0)
MCHC: 33.8 g/dL (ref 30.0–36.0)
MCV: 95.1 fL (ref 80.0–100.0)
Monocytes Absolute: 1.3 10*3/uL — ABNORMAL HIGH (ref 0.1–1.0)
Monocytes Relative: 10 %
Neutro Abs: 11.5 10*3/uL — ABNORMAL HIGH (ref 1.7–7.7)
Neutrophils Relative %: 82 %
Platelets: 249 10*3/uL (ref 150–400)
RBC: 4.1 MIL/uL (ref 3.87–5.11)
RDW: 13.9 % (ref 11.5–15.5)
WBC: 13.9 10*3/uL — ABNORMAL HIGH (ref 4.0–10.5)
nRBC: 0 % (ref 0.0–0.2)

## 2019-02-28 LAB — URINALYSIS, COMPLETE (UACMP) WITH MICROSCOPIC
Bacteria, UA: NONE SEEN
Bilirubin Urine: NEGATIVE
Glucose, UA: NEGATIVE mg/dL
Hgb urine dipstick: NEGATIVE
Ketones, ur: 20 mg/dL — AB
Leukocytes,Ua: NEGATIVE
Nitrite: NEGATIVE
Protein, ur: NEGATIVE mg/dL
Specific Gravity, Urine: 1.017 (ref 1.005–1.030)
Squamous Epithelial / HPF: NONE SEEN (ref 0–5)
pH: 5 (ref 5.0–8.0)

## 2019-02-28 LAB — PROTIME-INR
INR: 1 (ref 0.8–1.2)
Prothrombin Time: 13.1 seconds (ref 11.4–15.2)

## 2019-02-28 LAB — SARS CORONAVIRUS 2 BY RT PCR (HOSPITAL ORDER, PERFORMED IN ~~LOC~~ HOSPITAL LAB): SARS Coronavirus 2: NEGATIVE

## 2019-02-28 LAB — LACTIC ACID, PLASMA
Lactic Acid, Venous: 1.9 mmol/L (ref 0.5–1.9)
Lactic Acid, Venous: 0.9 mmol/L (ref 0.5–1.9)

## 2019-02-28 LAB — CK: Total CK: 49 U/L (ref 38–234)

## 2019-02-28 LAB — TSH: TSH: 0.739 u[IU]/mL (ref 0.350–4.500)

## 2019-02-28 LAB — HEMOGLOBIN A1C
Hgb A1c MFr Bld: 5.6 % (ref 4.8–5.6)
Mean Plasma Glucose: 114.02 mg/dL

## 2019-02-28 MED ORDER — TRAMADOL HCL 50 MG PO TABS
50.0000 mg | ORAL_TABLET | Freq: Four times a day (QID) | ORAL | Status: DC | PRN
  Administered 2019-02-28 – 2019-03-02 (×2): 50 mg via ORAL
  Filled 2019-02-28 (×3): qty 1

## 2019-02-28 MED ORDER — SODIUM CHLORIDE 0.9 % IV BOLUS
1000.0000 mL | Freq: Once | INTRAVENOUS | Status: AC
  Administered 2019-02-28: 1000 mL via INTRAVENOUS

## 2019-02-28 MED ORDER — LINAGLIPTIN 5 MG PO TABS
5.0000 mg | ORAL_TABLET | Freq: Every day | ORAL | Status: DC
  Administered 2019-03-01 – 2019-03-02 (×2): 5 mg via ORAL
  Filled 2019-02-28 (×3): qty 1

## 2019-02-28 MED ORDER — ESTROGENS, CONJUGATED 0.625 MG/GM VA CREA
0.2500 | TOPICAL_CREAM | VAGINAL | Status: DC
  Filled 2019-02-28: qty 30

## 2019-02-28 MED ORDER — DOCUSATE SODIUM 100 MG PO CAPS
100.0000 mg | ORAL_CAPSULE | Freq: Two times a day (BID) | ORAL | Status: DC
  Administered 2019-02-28 – 2019-03-01 (×2): 100 mg via ORAL
  Filled 2019-02-28 (×3): qty 1

## 2019-02-28 MED ORDER — INSULIN ASPART 100 UNIT/ML ~~LOC~~ SOLN
0.0000 [IU] | Freq: Three times a day (TID) | SUBCUTANEOUS | Status: DC
  Administered 2019-02-28: 1 [IU] via SUBCUTANEOUS
  Filled 2019-02-28: qty 1

## 2019-02-28 MED ORDER — ONDANSETRON HCL 4 MG/2ML IJ SOLN
4.0000 mg | Freq: Four times a day (QID) | INTRAMUSCULAR | Status: DC | PRN
  Administered 2019-03-01: 4 mg via INTRAVENOUS
  Filled 2019-02-28: qty 2

## 2019-02-28 MED ORDER — SODIUM CHLORIDE 0.9 % IV SOLN
1.0000 g | Freq: Once | INTRAVENOUS | Status: AC
  Administered 2019-02-28: 1 g via INTRAVENOUS
  Filled 2019-02-28: qty 10

## 2019-02-28 MED ORDER — CAPSAICIN 0.025 % EX CREA
TOPICAL_CREAM | Freq: Two times a day (BID) | CUTANEOUS | Status: DC
  Administered 2019-02-28: 15:00:00 via TOPICAL
  Filled 2019-02-28: qty 60

## 2019-02-28 MED ORDER — ALPRAZOLAM 0.25 MG PO TABS: 0.2500 mg | ORAL_TABLET | Freq: Every day | ORAL | Status: DC | PRN

## 2019-02-28 MED ORDER — TRIAMCINOLONE 0.1 % CREAM:EUCERIN CREAM 1:1: 1.0000 "application " | TOPICAL_CREAM | Freq: Every day | CUTANEOUS | Status: DC

## 2019-02-28 MED ORDER — METOPROLOL SUCCINATE ER 25 MG PO TB24
25.0000 mg | ORAL_TABLET | Freq: Every day | ORAL | Status: DC
  Administered 2019-02-28 – 2019-03-02 (×3): 25 mg via ORAL
  Filled 2019-02-28 (×3): qty 1

## 2019-02-28 MED ORDER — PREGABALIN 50 MG PO CAPS
100.0000 mg | ORAL_CAPSULE | Freq: Three times a day (TID) | ORAL | Status: DC
  Administered 2019-02-28 – 2019-03-01 (×4): 100 mg via ORAL
  Filled 2019-02-28 (×4): qty 2

## 2019-02-28 MED ORDER — INSULIN ASPART 100 UNIT/ML ~~LOC~~ SOLN: 0.0000 [IU] | Freq: Every day | SUBCUTANEOUS | Status: DC

## 2019-02-28 MED ORDER — SODIUM CHLORIDE 0.9 % IV SOLN
1.0000 g | INTRAVENOUS | Status: DC
  Administered 2019-03-01 – 2019-03-02 (×2): 1 g via INTRAVENOUS
  Filled 2019-02-28 (×2): qty 1
  Filled 2019-02-28: qty 10

## 2019-02-28 MED ORDER — AZITHROMYCIN 250 MG PO TABS
250.0000 mg | ORAL_TABLET | Freq: Every day | ORAL | Status: DC
  Administered 2019-02-28 – 2019-03-02 (×3): 250 mg via ORAL
  Filled 2019-02-28 (×3): qty 1

## 2019-02-28 MED ORDER — ACETAMINOPHEN 500 MG PO TABS
1000.0000 mg | ORAL_TABLET | Freq: Once | ORAL | Status: AC
  Administered 2019-02-28: 1000 mg via ORAL
  Filled 2019-02-28: qty 2

## 2019-02-28 MED ORDER — ACETAMINOPHEN 325 MG PO TABS
650.0000 mg | ORAL_TABLET | Freq: Four times a day (QID) | ORAL | Status: DC | PRN
  Administered 2019-03-01 – 2019-03-02 (×4): 650 mg via ORAL
  Filled 2019-02-28 (×4): qty 2

## 2019-02-28 MED ORDER — DOXYCYCLINE HYCLATE 100 MG PO TABS
100.0000 mg | ORAL_TABLET | Freq: Once | ORAL | Status: AC
  Administered 2019-02-28: 100 mg via ORAL
  Filled 2019-02-28: qty 1

## 2019-02-28 MED ORDER — MECLIZINE HCL 12.5 MG PO TABS: 12.5000 mg | ORAL_TABLET | Freq: Three times a day (TID) | ORAL | Status: DC | PRN

## 2019-02-28 MED ORDER — ONDANSETRON HCL 4 MG PO TABS: 4.0000 mg | ORAL_TABLET | Freq: Four times a day (QID) | ORAL | Status: DC | PRN

## 2019-02-28 MED ORDER — SODIUM CHLORIDE 0.9 % IV SOLN
INTRAVENOUS | Status: DC
  Administered 2019-02-28 (×2): via INTRAVENOUS

## 2019-02-28 MED ORDER — MORPHINE SULFATE (PF) 2 MG/ML IV SOLN
1.0000 mg | Freq: Four times a day (QID) | INTRAVENOUS | Status: DC | PRN
  Administered 2019-02-28: 2 mg via INTRAVENOUS
  Administered 2019-02-28: 14:00:00 1 mg via INTRAVENOUS
  Administered 2019-03-01 (×2): 2 mg via INTRAVENOUS
  Filled 2019-02-28 (×5): qty 1

## 2019-02-28 MED ORDER — ACETAMINOPHEN 650 MG RE SUPP: 650.0000 mg | Freq: Four times a day (QID) | RECTAL | Status: DC | PRN

## 2019-02-28 MED ORDER — ENOXAPARIN SODIUM 40 MG/0.4ML ~~LOC~~ SOLN
40.0000 mg | SUBCUTANEOUS | Status: DC
  Administered 2019-02-28 – 2019-03-02 (×3): 40 mg via SUBCUTANEOUS
  Filled 2019-02-28 (×3): qty 0.4

## 2019-02-28 MED ORDER — FERROUS SULFATE 325 (65 FE) MG PO TABS: 325.0000 mg | ORAL_TABLET | ORAL | Status: DC

## 2019-02-28 MED ORDER — POLYETHYLENE GLYCOL 3350 17 G PO PACK
17.0000 g | PACK | Freq: Every day | ORAL | Status: DC
  Administered 2019-03-01: 17 g via ORAL
  Filled 2019-02-28 (×2): qty 1

## 2019-02-28 NOTE — Progress Notes (Signed)
CODE SEPSIS - PHARMACY COMMUNICATION  **Broad Spectrum Antibiotics should be administered within 1 hour of Sepsis diagnosis**  Time Code Sepsis Called/Page Received: 0233  Antibiotics Ordered: ceftriaxone/doxycycline  Time of 1st antibiotic administration: 0315  Additional action taken by pharmacy:   If necessary, Name of Provider/Nurse Contacted:     Tobie Lords ,PharmD Clinical Pharmacist  02/28/2019  3:24 AM

## 2019-02-28 NOTE — ED Triage Notes (Signed)
Pt arrived via [facility] from home with a fall. Pt does not remember how long she was down for. Temp with FD was 102.4, reports fever for around a day and a sore mouth. Other vitals WNL, oxygen sat in the 90s.

## 2019-02-28 NOTE — ED Provider Notes (Signed)
[facility] Emergency Department Provider Note  ____________________________________________  Time seen: Approximately 1:48 AM  I have reviewed the triage vital signs and the nursing notes.   HISTORY  Chief Complaint Fever and Fall  Level 5 caveat:  Portions of the history and physical were unable to be obtained due to confusion   HPI Jordan Montoya is a 85 y.o. female with a history of type 2 diabetes, hyperlipidemia, psoriasis, depression, anxiety who presents for evaluation of a fall.  Patient lives alone.  Had a fall and pushed her helpline button.  She reports being on the floor for about 15 minutes.  She denies feeling dizzy.  She reports that her legs just gave out.  She reports feeling very weak today.  She is complaining of a mild cough and shortness of breath.  She did not know she had a fever.  She denies chest pain or abdominal pain, vomiting, diarrhea, dysuria.  Patient is slightly confused.    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   Psoriasis 11/25/2014   followed by Dermatology    Type 2 diabetes mellitus with hyperosmolarity without coma, without long-term current use of insulin (HCC) 09/30/2015    Patient Active Problem List   Diagnosis Date Noted   Anxiety disorder 11/16/2018   Primary osteoarthritis of both hands 08/16/2018   Arthralgia of multiple sites 05/18/2018   Pernicious anemia 12/29/2017   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 Authorizing Provider  acetaminophen (TYLENOL) 650 MG CR tablet Take 650 mg by mouth every 8 (eight) hours as needed for pain.     [provider]  ALPRAZolam Prudy Feeler) 0.25 MG tablet Take 1 tablet (0.25 mg total) by mouth daily as needed for anxiety. 02/07/19   Reubin Milan, MD  conjugated estrogens (PREMARIN) vaginal cream Place 0.25 Applicatorfuls vaginally 3 (three) times a week. 11/17/18   Reubin Milan, MD  COSENTYX SENSOREADY 300 DOSE 150 MG/ML SOAJ Inject 600 mg into the skin every 30 (thirty) days. 01/26/16   [provider]  docusate sodium (STOOL SOFTENER) 250 MG capsule Take 250 mg by mouth daily.    [provider]  ferrous sulfate 325 (65 FE) MG tablet Take 325 mg by mouth as directed.     [provider]  Garlic 1000 MG CAPS Take 1 capsule by mouth daily.     [provider]  glimepiride (AMARYL) 1 MG tablet Take 1 tablet (1 mg total) by mouth daily with breakfast. 10/20/18   Reubin Milan, MD  HYDROmorphone (  DILAUDID) 2 MG tablet Take 0.5-1 tablets (1-2 mg total) by mouth every 8 (eight) hours as needed for severe pain. 02/01/19   Cook, Jayce G, DO  JANUVIA 100 MG tablet TAKE 1 TABLET (100 MG TOTAL) BY MOUTH DAILY. 03/15/18   Reubin MilanBerglund, Laura H, MD  meclizine (ANTIVERT) 12.5 MG tablet Take 12.5 mg by mouth 3 (three) times daily as needed for dizziness.    [provider]  meloxicam (MOBIC) 7.5 MG tablet Take 1 tablet by mouth daily. Take with food 08/16/18   [provider]  metoprolol succinate (TOPROL-XL) 25 MG 24 hr tablet Take  25 mg by mouth daily.     [provider]  ondansetron (ZOFRAN ODT) 4 MG disintegrating tablet Take 1 tablet (4 mg total) by mouth every 8 (eight) hours as needed for nausea or vomiting. 12/20/18   Minna AntisPaduchowski, Kevin, MD  PARoxetine (PAXIL) 30 MG tablet TAKE 1 TABLET BY MOUTH EVERY DAY 01/26/19   Reubin MilanBerglund, Laura H, MD  polyethylene glycol (MIRALAX) 17 g packet Take 17 g by mouth daily. 12/09/18   Phineas SemenGoodman, Graydon, MD  pregabalin (LYRICA) 100 MG capsule TAKE 1 CAPSULE BY MOUTH THREE TIMES A DAY 02/26/19   Reubin MilanBerglund, Laura H, MD  Triamcinolone Acetonide (TRIAMCINOLONE 0.1 % CREAM : EUCERIN) CREA Apply 1 application topically daily.     [provider]  gabapentin (NEURONTIN) 100 MG capsule Take 1 capsule (100 mg total) by mouth 3 (three) times daily. Patient not taking: Reported on 01/12/2019 12/18/18 02/01/19  Reubin MilanBerglund, Laura H, MD    Allergies Metformin  Family History  Problem Relation Age of Onset   Cancer Mother    Heart disease Father    Diabetes Son    Breast cancer Neg Hx     Social History Social History   Tobacco Use   Smoking status: Former Smoker    Packs/day: 0.25    Years: 20.00    Pack years: 5.00    Types: Cigarettes   Smokeless tobacco: Never Used  Substance Use Topics   Alcohol use: No    Alcohol/week: 0.0 standard drinks   Drug use: No    Review of Systems Constitutional: + fever. Eyes: Negative for visual changes. ENT: Negative for facial injury or neck injury Cardiovascular: Negative for chest injury. Respiratory: + shortness of breath and cough. Gastrointestinal: Negative for abdominal pain or injury. Genitourinary: Negative for dysuria. Musculoskeletal: Negative for back injury, negative for arm or leg pain. Skin: Negative for laceration/abrasions. Neurological: Negative for head injury.   ____________________________________________   PHYSICAL EXAM:  VITAL SIGNS: ED Triage Vitals  Enc Vitals Group     BP 02/28/19 0140 (!)  175/58     Pulse Rate 02/28/19 0140 (!) 104     Resp 02/28/19 0140 (!) 23     Temp 02/28/19 0140 (!) 101.4 F (38.6 C)     Temp Source 02/28/19 0140 Oral     SpO2 02/28/19 0140 96 %     Weight 02/28/19 0134 149 lb (67.6 kg)     Height 02/28/19 0134 5\' 6"  (1.676 m)     Head Circumference --      Peak Flow --      Pain Score 02/28/19 0134 0     Pain Loc --      Pain Edu? --      Excl. in GC? --     Constitutional: Alert and oriented to self, slightly confused. No acute distress. Does not appear intoxicated. HEENT Head: Normocephalic  and atraumatic. Face: No facial bony tenderness. Stable midface Ears: No hemotympanum bilaterally. No Battle sign Eyes: No eye injury. PERRL. No raccoon eyes Nose: Nontender. No epistaxis. No rhinorrhea Mouth/Throat: Mucous membranes are moist. No oropharyngeal blood. No dental injury. Airway patent without stridor. Normal voice. Neck: no C-collar. No midline c-spine tenderness.  Cardiovascular: Tachycardic with regular rhythm. Normal and symmetric distal pulses are present in all extremities. Pulmonary/Chest: Chest wall is stable and nontender to palpation/compression.  Mild increased work of breathing with bilateral crackles and good air movement.  Satting in the mid 90s on room air Abdominal: Soft, nontender, non distended. Musculoskeletal: Nontender with normal full range of motion in all extremities. No deformities. No thoracic or lumbar midline spinal tenderness. Pelvis is stable. Asymmetric leg swelling with LLE trace pitting edema. Skin: Skin is warm, dry and intact. No abrasions or contutions. Psychiatric: Speech and behavior are appropriate. Neurological: Normal speech and language. Moves all extremities to command. No gross focal neurologic deficits are appreciated.  Glascow Coma Score: 4 - Opens eyes on own 6 - Follows simple motor commands 4 - Seems confused, disoriented GCS: 14  ____________________________________________    LABS (all labs ordered are listed, but only abnormal results are displayed)  Labs Reviewed  COMPREHENSIVE METABOLIC PANEL - Abnormal; Notable for the following components:      Result Value   Glucose, Bld 155 (*)    BUN 29 (*)    All other components within normal limits  CBC WITH DIFFERENTIAL/PLATELET - Abnormal; Notable for the following components:   WBC 13.9 (*)    Neutro Abs 11.5 (*)    Monocytes Absolute 1.3 (*)    Abs Immature Granulocytes 0.08 (*)    All other components within normal limits  URINALYSIS, COMPLETE (UACMP) WITH MICROSCOPIC - Abnormal; Notable for the following components:   Color, Urine YELLOW (*)    APPearance CLEAR (*)    Ketones, ur 20 (*)    All other components within normal limits  SARS CORONAVIRUS 2 (HOSPITAL ORDER, PERFORMED IN [facility])  CULTURE, BLOOD (ROUTINE X 2)  CULTURE, BLOOD (ROUTINE X 2)  CULTURE, BLOOD (ROUTINE X 2)  CULTURE, BLOOD (ROUTINE X 2)  URINE CULTURE  LACTIC ACID, PLASMA  PROTIME-INR  CK  LACTIC ACID, PLASMA  URINALYSIS, ROUTINE W REFLEX MICROSCOPIC   ____________________________________________  EKG  ED ECG REPORT I, Nita Sicklearolina Javyn Havlin, the attending physician, personally viewed and interpreted this ECG.  Sinus tachycardia, rate of 105, normal intervals, normal axis, low voltage QRS, no ST elevation or depression.  Unchanged from prior. ____________________________________________  RADIOLOGY  I have personally reviewed the images performed during this visit and I agree with the Radiologist's read.   Interpretation by Radiologist:  Ct Head Wo Contrast  Result Date: 02/28/2019 CLINICAL DATA:  Larey SeatFell at home, fever EXAM: CT HEAD WITHOUT CONTRAST CT CERVICAL SPINE WITHOUT CONTRAST TECHNIQUE: Multidetector CT imaging of the head and cervical spine was performed following the standard protocol without intravenous contrast. Multiplanar CT image reconstructions of the cervical spine were also generated.  COMPARISON:  None. FINDINGS: CT HEAD FINDINGS Brain: No acute territorial infarction, hemorrhage or intracranial mass. Mild atrophy and small vessel ischemic changes of the white matter. Punctate calcification with in the pons without associated mass effect. Vascular: No hyperdense vessel.  Carotid vascular calcification Skull: Normal. Negative for fracture or focal lesion. Sinuses/Orbits: Mucosal thickening in the ethmoid sinuses Other: None CT CERVICAL SPINE FINDINGS Alignment: Straightening of the cervical spine. No subluxation. Facet alignment  within normal limits. Skull base and vertebrae: No acute fracture. No primary bone lesion or focal pathologic process. Soft tissues and spinal canal: No prevertebral fluid or swelling. No visible canal hematoma. Disc levels: Moderate degenerative changes at C4-C5 and C5-C6. Multiple level bilateral facet degenerative change. Upper chest: Negative. Other: None IMPRESSION: 1. No CT evidence for acute intracranial abnormality. Atrophy and mild small vessel ischemic changes of the white matter 2. Straightening of the cervical spine with degenerative change. No acute osseous abnormality Electronically Signed   By: Jasmine PangKim  Fujinaga M.D.   On: 02/28/2019 02:38   Ct Cervical Spine Wo Contrast  Result Date: 02/28/2019 CLINICAL DATA:  Larey SeatFell at home, fever EXAM: CT HEAD WITHOUT CONTRAST CT CERVICAL SPINE WITHOUT CONTRAST TECHNIQUE: Multidetector CT imaging of the head and cervical spine was performed following the standard protocol without intravenous contrast. Multiplanar CT image reconstructions of the cervical spine were also generated. COMPARISON:  None. FINDINGS: CT HEAD FINDINGS Brain: No acute territorial infarction, hemorrhage or intracranial mass. Mild atrophy and small vessel ischemic changes of the white matter. Punctate calcification with in the pons without associated mass effect. Vascular: No hyperdense vessel.  Carotid vascular calcification Skull: Normal. Negative for  fracture or focal lesion. Sinuses/Orbits: Mucosal thickening in the ethmoid sinuses Other: None CT CERVICAL SPINE FINDINGS Alignment: Straightening of the cervical spine. No subluxation. Facet alignment within normal limits. Skull base and vertebrae: No acute fracture. No primary bone lesion or focal pathologic process. Soft tissues and spinal canal: No prevertebral fluid or swelling. No visible canal hematoma. Disc levels: Moderate degenerative changes at C4-C5 and C5-C6. Multiple level bilateral facet degenerative change. Upper chest: Negative. Other: None IMPRESSION: 1. No CT evidence for acute intracranial abnormality. Atrophy and mild small vessel ischemic changes of the white matter 2. Straightening of the cervical spine with degenerative change. No acute osseous abnormality Electronically Signed   By: Jasmine PangKim  Fujinaga M.D.   On: 02/28/2019 02:38   Dg Chest Port 1 View  Result Date: 02/28/2019 CLINICAL DATA:  Fall, fever EXAM: PORTABLE CHEST 1 VIEW COMPARISON:  None. FINDINGS: Vague rounded opacity in the right mid lung. No pleural effusion. Normal heart size. Aortic atherosclerosis. IMPRESSION: Vague rounded opacity in the right mid lung could represent a focal area of pneumonia possibly a nodule. Further evaluation with CT is suggested Electronically Signed   By: Jasmine PangKim  Fujinaga M.D.   On: 02/28/2019 02:14      ____________________________________________   PROCEDURES  Procedure(s) performed: None Procedures Critical Care performed: yes  CRITICAL CARE Performed by: Nita Sicklearolina Jarmon Javid  ?  Total critical care time: 35 min  Critical care time was exclusive of separately billable procedures and treating other patients.  Critical care was necessary to treat or prevent imminent or life-threatening deterioration.  Critical care was time spent personally by me on the following activities: development of treatment plan with patient and/or surrogate as well as nursing, discussions with  consultants, evaluation of patient's response to treatment, examination of patient, obtaining history from patient or surrogate, ordering and performing treatments and interventions, ordering and review of laboratory studies, ordering and review of radiographic studies, pulse oximetry and re-evaluation of patient's condition.  ____________________________________________   INITIAL IMPRESSION / ASSESSMENT AND PLAN / ED COURSE   85 y.o. female with a history of type 2 diabetes, hyperlipidemia, psoriasis, depression, anxiety who presents for evaluation of a fall, generalized weakness, some confusion, cough and SOB. Found to have a fever per EMS of 102F.  Upon arrival patient slightly  confused, tachycardic, tachypneic, febrile, no oxygen requirement, mild crackles bilaterally.  No traumatic injuries on exam.  No rashes.  We will do CT head and cervical spine to rule out any acute injuries.  Will check labs for concerns of sepsis, will get urinalysis and chest x-ray, lactic, CBC, CMP, blood cultures, COVID swab, chest x-ray.  Patient noted to have asymmetric leg swelling will send patient for Doppler studies.  Clinical Course as of Feb 27 300  Wed Feb 28, 2019  0258 Work-up consistent with sepsis from pneumonia.  Patient with fever, tachycardia, tachypnea, leukocytosis.  No oxygen requirement.  UA is negative.  CT head and cervical spine with no traumatic injuries.  No recent admissions.  Patient lives at home.  Will treat with Rocephin and doxycycline.  Patient received Tylenol and IV fluids.  COVID swab is negative.  Will admit to the hospitalist service.   [CV]    Clinical Course User Index [CV] Alfred Levins [place], MD      As part of my medical decision making, I reviewed the following data within the [facility] notes reviewed and incorporated, Labs reviewed , EKG interpreted , Old EKG reviewed, Old chart reviewed, Radiograph reviewed , Discussed with admitting physician ,  Notes from prior ED visits and [place] Controlled Substance Database   Patient was evaluated in Emergency Department today for the symptoms described in the history of present illness. Patient was evaluated in the context of the global COVID-19 pandemic, which necessitated consideration that the patient might be at risk for infection with the SARS-CoV-2 virus that causes COVID-19. Institutional protocols and algorithms that pertain to the evaluation of patients at risk for COVID-19 are in a state of rapid change based on information released by regulatory bodies including the CDC and federal and state organizations. These policies and algorithms were followed during the patient's care in the ED.   ____________________________________________   FINAL CLINICAL IMPRESSION(S) / ED DIAGNOSES   Final diagnoses:  Sepsis without acute organ dysfunction, due to unspecified organism [facility])  Community acquired pneumonia, unspecified laterality      NEW MEDICATIONS STARTED DURING THIS VISIT:  ED Discharge Orders    None       Note:  This document was prepared using Dragon voice recognition software and may include unintentional dictation errors.    Alfred Levins, [place], MD 02/28/19 [phone]

## 2019-02-28 NOTE — ED Notes (Addendum)
ED TO INPATIENT HANDOFF REPORT  ED Nurse Name and Phone #: Geraldine ContrasDee [phone]  S Name/Age/Gender Jordan Montoya 85 y.o. female Room/Bed: ED12A/ED12A  Code Status   Code Status: Not on file  Home/SNF/Other Home Patient oriented to: self, place, time and situation Is this baseline? Yes   Triage Complete: Triage complete  Chief Complaint [facility] - Fever  Triage Note Pt arrived via [facility] from home with a fall. Pt does not remember how long she was down for. Temp with FD was 102.4, reports fever for around a day and a sore mouth. Other vitals WNL, oxygen sat in the 90s.   Allergies Allergies  Allergen Reactions  . Metformin Nausea Only    Level of Care/Admitting Diagnosis ED Disposition    ED Disposition Condition Comment   Admit  Hospital Area: [facility] [[id number]]  Level of Care: Med-Surg [16]  Covid Evaluation: Confirmed COVID Negative  Diagnosis: Sepsis [facility](HCC) [[id number]]) [[id number]]  Admitting Physician: Arnaldo NatalIAMOND, MICHAEL S [[id number]]  Attending Physician: Arnaldo NatalDIAMOND, MICHAEL S [[id number]]  Estimated length of stay: past midnight tomorrow  Certification:: I certify this patient will need inpatient services for at least 2 midnights  PT Class (Do Not Modify): Inpatient [101]  PT Acc Code (Do Not Modify): Private [1]       B Medical/Surgery History Past Medical History:  Diagnosis Date  . Allergy   . Anxiety   . Avitaminosis D 11/25/2014  . Environmental and seasonal allergies 05/30/2015  . HLD (hyperlipidemia) 08/20/2011   Overview:  LDL goal <100 given DM2   . 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 Psoriasis 11/25/2014   followed by Dermatology   . Type 2 diabetes mellitus with hyperosmolarity without coma, without long-term current use of insulin (HCC) 09/30/2015   Past Surgical History:  Procedure Laterality Date  . BREAST BIOPSY Left    neg-bx/clip  . BREAST CYST EXCISION Left    neg  . EYE SURGERY  Bilateral    cataracts  . PARTIAL HYSTERECTOMY     one ovary remains     A IV Location/Drains/Wounds Patient Lines/Drains/Airways Status   Active Line/Drains/Airways    Name:   Placement date:   Placement time:   Site:   Days:   Peripheral IV 02/28/19 Right Antecubital   02/28/19    0139    Antecubital   less than 1   Peripheral IV 02/28/19 Left Antecubital   02/28/19    0142    Antecubital   less than 1          Intake/Output Last 24 hours No intake or output data in the 24 hours ending 02/28/19 0308  Labs/Imaging Results for orders placed or performed during the hospital encounter of 02/28/19 (from the past 48 hour(s))  Comprehensive metabolic panel     Status: Abnormal   Collection Time: 02/28/19  1:38 AM  Result Value Ref Range   Sodium 137 135 - 145 mmol/L   Potassium 3.6 3.5 - 5.1 mmol/L   Chloride 103 98 - 111 mmol/L   CO2 24 22 - 32 mmol/L   Glucose, Bld 155 (H) 70 - 99 mg/dL   BUN 29 (H) 8 - 23 mg/dL   Creatinine, Ser 6.960.72 0.44 - 1.00 mg/dL   Calcium 9.2 8.9 - 29.510.3 mg/dL   Total Protein 7.4 6.5 - 8.1 g/dL   Albumin 4.2 3.5 - 5.0 g/dL   AST 21 15 - 41 U/L   ALT 18  0 - 44 U/L   Alkaline Phosphatase 61 38 - 126 U/L   Total Bilirubin 0.9 0.3 - 1.2 mg/dL   GFR calc non Af Amer >60 >60 mL/min   GFR calc Af Amer >60 >60 mL/min   Anion gap 10 5 - 15    Comment: Performed at [facility], [address] [place], [place] [address]  Lactic acid, plasma     Status: None   Collection Time: 02/28/19  1:38 AM  Result Value Ref Range   Lactic Acid, Venous 1.9 0.5 - 1.9 mmol/L    Comment: Performed at [facility], [address] [place], [place] [address]  CBC with Differential     Status: Abnormal   Collection Time: 02/28/19  1:38 AM  Result Value Ref Range   WBC 13.9 (H) 4.0 - 10.5 K/uL   RBC 4.10 3.87 - 5.11 MIL/uL   Hemoglobin 13.2 12.0 - 15.0 g/dL   HCT 84.6 96.2 - 95.2 %   MCV 95.1 80.0 - 100.0 fL   MCH 32.2 26.0 - 34.0 pg   MCHC 33.8  30.0 - 36.0 g/dL   RDW 84.1 32.4 - 40.1 %   Platelets 249 150 - 400 K/uL   nRBC 0.0 0.0 - 0.2 %   Neutrophils Relative % 82 %   Neutro Abs 11.5 (H) 1.7 - 7.7 K/uL   Lymphocytes Relative 7 %   Lymphs Abs 1.0 0.7 - 4.0 K/uL   Monocytes Relative 10 %   Monocytes Absolute 1.3 (H) 0.1 - 1.0 K/uL   Eosinophils Relative 0 %   Eosinophils Absolute 0.0 0.0 - 0.5 K/uL   Basophils Relative 0 %   Basophils Absolute 0.0 0.0 - 0.1 K/uL   Immature Granulocytes 1 %   Abs Immature Granulocytes 0.08 (H) 0.00 - 0.07 K/uL    Comment: Performed at [facility], [address] [place], [place] [address]  Protime-INR     Status: None   Collection Time: 02/28/19  1:38 AM  Result Value Ref Range   Prothrombin Time 13.1 11.4 - 15.2 seconds   INR 1.0 0.8 - 1.2    Comment: (NOTE) INR goal varies based on device and disease states. Performed at [facility], [address] [place], [place] [address]   CK     Status: None   Collection Time: 02/28/19  1:38 AM  Result Value Ref Range   Total CK 49 38 - 234 U/L    Comment: Performed at [facility], [address] [place], [place] [address]  Urinalysis, Complete w Microscopic     Status: Abnormal   Collection Time: 02/28/19  1:50 AM  Result Value Ref Range   Color, Urine YELLOW (A) YELLOW   APPearance CLEAR (A) CLEAR   Specific Gravity, Urine 1.017 1.005 - 1.030   pH 5.0 5.0 - 8.0   Glucose, UA NEGATIVE NEGATIVE mg/dL   Hgb urine dipstick NEGATIVE NEGATIVE   Bilirubin Urine NEGATIVE NEGATIVE   Ketones, ur 20 (A) NEGATIVE mg/dL   Protein, ur NEGATIVE NEGATIVE mg/dL   Nitrite NEGATIVE NEGATIVE   Leukocytes,Ua NEGATIVE NEGATIVE   RBC / HPF 0-5 0 - 5 RBC/hpf   WBC, UA 0-5 0 - 5 WBC/hpf   Bacteria, UA NONE SEEN NONE SEEN   Squamous Epithelial / LPF NONE SEEN 0 - 5   Mucus PRESENT     Comment: Performed at [facility], [address], [place], [place] [address]  SARS Coronavirus  2 (CEPHEID- Performed in [place] lab), Hosp Order     Status: None   Collection Time: 02/28/19  1:50 AM   Specimen: Nasopharyngeal Swab  Result Value Ref Range   SARS Coronavirus 2 NEGATIVE NEGATIVE    Comment: (NOTE) If result is NEGATIVE SARS-CoV-2 target nucleic acids are NOT DETECTED. The SARS-CoV-2 RNA is generally detectable in upper and lower  respiratory specimens during the acute phase of infection. The lowest  concentration of SARS-CoV-2 viral copies this assay can detect is 250  copies / mL. A negative result does not preclude SARS-CoV-2 infection  and should not be used as the sole basis for treatment or other  patient management decisions.  A negative result may occur with  improper specimen collection / handling, submission of specimen other  than nasopharyngeal swab, presence of viral mutation(s) within the  areas targeted by this assay, and inadequate number of viral copies  (<250 copies / mL). A negative result must be combined with clinical  observations, patient history, and epidemiological information. If result is POSITIVE SARS-CoV-2 target nucleic acids are DETECTED. The SARS-CoV-2 RNA is generally detectable in upper and lower  respiratory specimens dur ing the acute phase of infection.  Positive  results are indicative of active infection with SARS-CoV-2.  Clinical  correlation with patient history and other diagnostic information is  necessary to determine patient infection status.  Positive results do  not rule out bacterial infection or co-infection with other viruses. If result is PRESUMPTIVE POSTIVE SARS-CoV-2 nucleic acids MAY BE PRESENT.   A presumptive positive result was obtained on the submitted specimen  and confirmed on repeat testing.  While 2019 novel coronavirus  (SARS-CoV-2) nucleic acids may be present in the submitted sample  additional confirmatory testing may be necessary for epidemiological  and / or clinical management purposes  to differentiate between   SARS-CoV-2 and other Sarbecovirus currently known to infect humans.  If clinically indicated additional testing with an alternate test  methodology [phone]) is advised. The SARS-CoV-2 RNA is generally  detectable in upper and lower respiratory sp ecimens during the acute  phase of infection. The expected result is Negative. Fact Sheet for Patients:  StrictlyIdeas.no Fact Sheet for Healthcare Providers: BankingDealers.co.za This test is not yet approved or cleared by the [place] FDA and has been authorized for detection and/or diagnosis of SARS-CoV-2 by FDA under an Emergency Use Authorization (EUA).  This EUA will remain in effect (meaning this test can be used) for the duration of the COVID-19 declaration under Section 564(b)(1) of the Act, 21 U.S.C. section 360bbb-3(b)(1), unless the authorization is terminated or revoked sooner. Performed at [facility], [place], [place] [address]    Ct Head Wo Contrast  Result Date: 02/28/2019 CLINICAL DATA:  [place] at home, fever EXAM: CT HEAD WITHOUT CONTRAST CT CERVICAL SPINE WITHOUT CONTRAST TECHNIQUE: Multidetector CT imaging of the head and cervical spine was performed following the standard protocol without intravenous contrast. Multiplanar CT image reconstructions of the cervical spine were also generated. COMPARISON:  None. FINDINGS: CT HEAD FINDINGS Brain: No acute territorial infarction, hemorrhage or intracranial mass. Mild atrophy and small vessel ischemic changes of the white matter. Punctate calcification with in the pons without associated mass effect. Vascular: No hyperdense vessel.  Carotid vascular calcification Skull: Normal. Negative for fracture or focal lesion. Sinuses/Orbits: Mucosal thickening in the ethmoid sinuses Other: None CT CERVICAL SPINE FINDINGS Alignment: Straightening of the cervical spine. No subluxation. Facet alignment within  normal  limits. Skull base and vertebrae: No acute fracture. No primary bone lesion or focal pathologic process. Soft tissues and spinal canal: No prevertebral fluid or swelling. No visible canal hematoma. Disc levels: Moderate degenerative changes at C4-C5 and C5-C6. Multiple level bilateral facet degenerative change. Upper chest: Negative. Other: None IMPRESSION: 1. No CT evidence for acute intracranial abnormality. Atrophy and mild small vessel ischemic changes of the white matter 2. Straightening of the cervical spine with degenerative change. No acute osseous abnormality Electronically Signed   By: Jasmine PangKim  Fujinaga M.D.   On: 02/28/2019 02:38   Ct Cervical Spine Wo Contrast  Result Date: 02/28/2019 CLINICAL DATA:  [place] at home, fever EXAM: CT HEAD WITHOUT CONTRAST CT CERVICAL SPINE WITHOUT CONTRAST TECHNIQUE: Multidetector CT imaging of the head and cervical spine was performed following the standard protocol without intravenous contrast. Multiplanar CT image reconstructions of the cervical spine were also generated. COMPARISON:  None. FINDINGS: CT HEAD FINDINGS Brain: No acute territorial infarction, hemorrhage or intracranial mass. Mild atrophy and small vessel ischemic changes of the white matter. Punctate calcification with in the pons without associated mass effect. Vascular: No hyperdense vessel.  Carotid vascular calcification Skull: Normal. Negative for fracture or focal lesion. Sinuses/Orbits: Mucosal thickening in the ethmoid sinuses Other: None CT CERVICAL SPINE FINDINGS Alignment: Straightening of the cervical spine. No subluxation. Facet alignment within normal limits. Skull base and vertebrae: No acute fracture. No primary bone lesion or focal pathologic process. Soft tissues and spinal canal: No prevertebral fluid or swelling. No visible canal hematoma. Disc levels: Moderate degenerative changes at C4-C5 and C5-C6. Multiple level bilateral facet degenerative change. Upper chest: Negative. Other: None  IMPRESSION: 1. No CT evidence for acute intracranial abnormality. Atrophy and mild small vessel ischemic changes of the white matter 2. Straightening of the cervical spine with degenerative change. No acute osseous abnormality Electronically Signed   By: Jasmine PangKim  Fujinaga M.D.   On: 02/28/2019 02:38   Dg Chest Port 1 View  Result Date: 02/28/2019 CLINICAL DATA:  Fall, fever EXAM: PORTABLE CHEST 1 VIEW COMPARISON:  None. FINDINGS: Vague rounded opacity in the right mid lung. No pleural effusion. Normal heart size. Aortic atherosclerosis. IMPRESSION: Vague rounded opacity in the right mid lung could represent a focal area of pneumonia possibly a nodule. Further evaluation with CT is suggested Electronically Signed   By: Jasmine PangKim  Fujinaga M.D.   On: 02/28/2019 02:14    Pending Labs Unresulted Labs (From admission, onward)    Start     Ordered   02/28/19 0228  Blood Culture (routine x 2)  BLOOD CULTURE X 2,   STAT     02/28/19 0227   02/28/19 0228  Urinalysis, Routine w reflex microscopic  ONCE - STAT,   STAT     02/28/19 0227   02/28/19 0228  Urine culture  ONCE - STAT,   STAT     02/28/19 0227   02/28/19 0138  Lactic acid, plasma  Now then every 2 hours,   STAT     02/28/19 0137   02/28/19 0138  Culture, blood (Routine x 2)  BLOOD CULTURE X 2,   STAT     02/28/19 0137   Signed and Held  Creatinine, serum  (enoxaparin (LOVENOX)    CrCl >/= 30 ml/min)  Weekly,   R    Comments: while on enoxaparin therapy    Signed and Held   Signed and Held  TSH  Add-on,   R     Signed  and Held   Signed and Held  Hemoglobin A1c  Add-on,   R     Signed and Held          Vitals/Pain Today's Vitals   02/28/19 0134 02/28/19 0140 02/28/19 0140  BP:  (!) 175/58 (!) 175/58  Pulse:  (!) 104 (!) 104  Resp:  (!) 24 (!) 23  Temp:   (!) 101.4 F (38.6 C)  TempSrc:   Oral  SpO2:  95% 96%  Weight: 67.6 kg    Height: 5\' 6"  (1.676 m)    PainSc: 0-No pain      Isolation Precautions Airborne and Contact  precautions  Medications Medications  cefTRIAXone (ROCEPHIN) 1 g in sodium chloride 0.9 % 100 mL IVPB (has no administration in time range)  doxycycline (VIBRA-TABS) tablet 100 mg (has no administration in time range)  acetaminophen (TYLENOL) tablet 1,000 mg (1,000 mg Oral Given 02/28/19 0158)  sodium chloride 0.9 % bolus 1,000 mL (1,000 mLs Intravenous New Bag/Given 02/28/19 0200)    Mobility walks with device High fall risk   Focused Assessments    R Recommendations: See Admitting Provider Note  Report given to: Para MarchJeanette, RN

## 2019-02-28 NOTE — H&P (Signed)
Jordan Montoya is an 85 y.o. female.   Chief Complaint: Fall HPI: The patient with past medical history of diabetes and depression presents to the emergency department following a mechanical fall.  The patient reported that she had been feeling weak.  She also admits to fever.  The patient denies nausea, vomiting or diarrhea but admits to some shortness of breath.  Chest x-ray showed a vague round opacity in the right middle lobe.  CT of her head and neck was without acute abnormality.  Patient met sepsis criteria.  Blood cultures were obtained prior to initiation of antibiotics.  Patient remained stable throughout the emergency department evaluation and the hospitalist service was called for admission.  Past Medical History:  Diagnosis Date  . Allergy   . Anxiety   . Avitaminosis D 11/25/2014  . Environmental and seasonal allergies 05/30/2015  . HLD (hyperlipidemia) 08/20/2011   Overview:  LDL goal <100 given DM2   . Major depressive disorder with single episode, in partial remission ([place]) 11/25/2014  . Psoriasis 11/25/2014   followed by Dermatology   . Type 2 diabetes mellitus with hyperosmolarity without coma, without long-term current use of insulin ([place]) 09/30/2015    Past Surgical History:  Procedure Laterality Date  . BREAST BIOPSY Left    neg-bx/clip  . BREAST CYST EXCISION Left    neg  . EYE SURGERY Bilateral    cataracts  . PARTIAL HYSTERECTOMY     one ovary remains    Family History  Problem Relation Age of Onset  . Cancer Mother   . Heart disease Father   . Diabetes Son   . Breast cancer Neg Hx    Social History:  reports that she has quit smoking. Her smoking use included cigarettes. She has a 5.00 pack-year smoking history. She has never used smokeless tobacco. She reports that she does not drink alcohol or use drugs.  Allergies:  Allergies  Allergen Reactions  . Metformin Nausea Only    Medications Prior to Admission  Medication Sig Dispense Refill  .  acetaminophen (TYLENOL) 650 MG CR tablet Take 650 mg by mouth every 8 (eight) hours as needed for pain.     Marland Kitchen ALPRAZolam (XANAX) 0.25 MG tablet Take 1 tablet (0.25 mg total) by mouth daily as needed for anxiety. 30 tablet 1  . COSENTYX SENSOREADY 300 DOSE 150 MG/ML SOAJ Inject 600 mg into the skin every 30 (thirty) days.    Marland Kitchen glimepiride (AMARYL) 1 MG tablet Take 1 tablet (1 mg total) by mouth daily with breakfast. 30 tablet 2  . HYDROmorphone (DILAUDID) 2 MG tablet Take 0.5-1 tablets (1-2 mg total) by mouth every 8 (eight) hours as needed for severe pain. 10 tablet 0  . JANUVIA 100 MG tablet TAKE 1 TABLET (100 MG TOTAL) BY MOUTH DAILY. 30 tablet 11  . metoprolol succinate (TOPROL-XL) 25 MG 24 hr tablet Take 25 mg by mouth daily.     . ondansetron (ZOFRAN ODT) 4 MG disintegrating tablet Take 1 tablet (4 mg total) by mouth every 8 (eight) hours as needed for nausea or vomiting. 20 tablet 0  . polyethylene glycol (MIRALAX) 17 g packet Take 17 g by mouth daily. 14 each 0  . pregabalin (LYRICA) 100 MG capsule TAKE 1 CAPSULE BY MOUTH THREE TIMES A DAY 90 capsule 0  . psyllium (METAMUCIL) 58.6 % powder Take 1 packet by mouth daily as needed.    . conjugated estrogens (PREMARIN) vaginal cream Place 9.82 Applicatorfuls vaginally 3 (three)  times a week. 42.5 g 12  . docusate sodium (STOOL SOFTENER) 250 MG capsule Take 250 mg by mouth daily.    . ferrous sulfate 325 (65 FE) MG tablet Take 325 mg by mouth as directed.     . Garlic 8270 MG CAPS Take 1 capsule by mouth daily.     . meclizine (ANTIVERT) 12.5 MG tablet Take 12.5 mg by mouth 3 (three) times daily as needed for dizziness.    . meloxicam (MOBIC) 7.5 MG tablet Take 1 tablet by mouth daily. Take with food    . PARoxetine (PAXIL) 30 MG tablet TAKE 1 TABLET BY MOUTH EVERY DAY (Patient not taking: Reported on 02/28/2019) 90 tablet 1  . Triamcinolone Acetonide (TRIAMCINOLONE 0.1 % CREAM : EUCERIN) CREA Apply 1 application topically daily.       Results  for orders placed or performed during the hospital encounter of 02/28/19 (from the past 48 hour(s))  Comprehensive metabolic panel     Status: Abnormal   Collection Time: 02/28/19  1:38 AM  Result Value Ref Range   Sodium 137 135 - 145 mmol/L   Potassium 3.6 3.5 - 5.1 mmol/L   Chloride 103 98 - 111 mmol/L   CO2 24 22 - 32 mmol/L   Glucose, Bld 155 (H) 70 - 99 mg/dL   BUN 29 (H) 8 - 23 mg/dL   Creatinine, Ser 0.72 0.44 - 1.00 mg/dL   Calcium 9.2 8.9 - 10.3 mg/dL   Total Protein 7.4 6.5 - 8.1 g/dL   Albumin 4.2 3.5 - 5.0 g/dL   AST 21 15 - 41 U/L   ALT 18 0 - 44 U/L   Alkaline Phosphatase 61 38 - 126 U/L   Total Bilirubin 0.9 0.3 - 1.2 mg/dL   GFR calc non Af Amer >60 >60 mL/min   GFR calc Af Amer >60 >60 mL/min   Anion gap 10 5 - 15    Comment: Performed at [facility], [address] [place], [place] [address]  Lactic acid, plasma     Status: None   Collection Time: 02/28/19  1:38 AM  Result Value Ref Range   Lactic Acid, Venous 1.9 0.5 - 1.9 mmol/L    Comment: Performed at [facility], [address] [place], [place] [address]  CBC with Differential     Status: Abnormal   Collection Time: 02/28/19  1:38 AM  Result Value Ref Range   WBC 13.9 (H) 4.0 - 10.5 K/uL   RBC 4.10 3.87 - 5.11 MIL/uL   Hemoglobin 13.2 12.0 - 15.0 g/dL   HCT 39.0 36.0 - 46.0 %   MCV 95.1 80.0 - 100.0 fL   MCH 32.2 26.0 - 34.0 pg   MCHC 33.8 30.0 - 36.0 g/dL   RDW 13.9 11.5 - 15.5 %   Platelets 249 150 - 400 K/uL   nRBC 0.0 0.0 - 0.2 %   Neutrophils Relative % 82 %   Neutro Abs 11.5 (H) 1.7 - 7.7 K/uL   Lymphocytes Relative 7 %   Lymphs Abs 1.0 0.7 - 4.0 K/uL   Monocytes Relative 10 %   Monocytes Absolute 1.3 (H) 0.1 - 1.0 K/uL   Eosinophils Relative 0 %   Eosinophils Absolute 0.0 0.0 - 0.5 K/uL   Basophils Relative 0 %   Basophils Absolute 0.0 0.0 - 0.1 K/uL   Immature Granulocytes 1 %   Abs Immature Granulocytes 0.08 (H) 0.00 - 0.07 K/uL    Comment: Performed at  [facility], [address] [place], [place] [address]  Protime-INR     Status: None   Collection Time: 02/28/19  1:38 AM  Result Value Ref Range   Prothrombin Time 13.1 11.4 - 15.2 seconds   INR 1.0 0.8 - 1.2    Comment: (NOTE) INR goal varies based on device and disease states. Performed at [facility], [address] [place], [place] [address]   Culture, blood (Routine x 2)     Status: None (Preliminary result)   Collection Time: 02/28/19  1:38 AM   Specimen: BLOOD  Result Value Ref Range   Specimen Description BLOOD LEFT ASSIST CONTROL    Special Requests      BOTTLES DRAWN AEROBIC AND ANAEROBIC Blood Culture results may not be optimal due to an excessive volume of blood received in culture bottles   Culture      NO GROWTH < 12 HOURS Performed at [facility], [address], [place], [place] [address]    Report Status PENDING   Culture, blood (Routine x 2)     Status: None (Preliminary result)   Collection Time: 02/28/19  1:38 AM   Specimen: BLOOD  Result Value Ref Range   Specimen Description BLOOD RIGHT ASSIST CONTROL    Special Requests      BOTTLES DRAWN AEROBIC AND ANAEROBIC Blood Culture results may not be optimal due to an excessive volume of blood received in culture bottles   Culture      NO GROWTH < 12 HOURS Performed at [facility], [address] [place], [place] [address]    Report Status PENDING   CK     Status: None   Collection Time: 02/28/19  1:38 AM  Result Value Ref Range   Total CK 49 38 - 234 U/L    Comment: Performed at [facility], [address] [place], [place] [address]  TSH     Status: None   Collection Time: 02/28/19  1:38 AM  Result Value Ref Range   TSH 0.739 0.350 - 4.500 uIU/mL    Comment: Performed by a 3rd Generation assay with a functional sensitivity of <=0.01 uIU/mL. Performed at [facility], [address] [place], [place] [address]   Urinalysis, Complete w  Microscopic     Status: Abnormal   Collection Time: 02/28/19  1:50 AM  Result Value Ref Range   Color, Urine YELLOW (A) YELLOW   APPearance CLEAR (A) CLEAR   Specific Gravity, Urine 1.017 1.005 - 1.030   pH 5.0 5.0 - 8.0   Glucose, UA NEGATIVE NEGATIVE mg/dL   Hgb urine dipstick NEGATIVE NEGATIVE   Bilirubin Urine NEGATIVE NEGATIVE   Ketones, ur 20 (A) NEGATIVE mg/dL   Protein, ur NEGATIVE NEGATIVE mg/dL   Nitrite NEGATIVE NEGATIVE   Leukocytes,Ua NEGATIVE NEGATIVE   RBC / HPF 0-5 0 - 5 RBC/hpf   WBC, UA 0-5 0 - 5 WBC/hpf   Bacteria, UA NONE SEEN NONE SEEN   Squamous Epithelial / LPF NONE SEEN 0 - 5   Mucus PRESENT     Comment: Performed at [facility], [address], [place],  [address]  SARS Coronavirus 2 (CEPHEID- Performed in [facility] lab), Hosp Order     Status: None   Collection Time: 02/28/19  1:50 AM   Specimen: Nasopharyngeal Swab  Result Value Ref Range   SARS Coronavirus 2 NEGATIVE NEGATIVE    Comment: (NOTE) If result is NEGATIVE SARS-CoV-2 target  nucleic acids are NOT DETECTED. The SARS-CoV-2 RNA is generally detectable in upper and lower  respiratory specimens during the acute phase of infection. The lowest  concentration of SARS-CoV-2 viral copies this assay can detect is 250  copies / mL. A negative result does not preclude SARS-CoV-2 infection  and should not be used as the sole basis for treatment or other  patient management decisions.  A negative result may occur with  improper specimen collection / handling, submission of specimen other  than nasopharyngeal swab, presence of viral mutation(s) within the  areas targeted by this assay, and inadequate number of viral copies  (<250 copies / mL). A negative result must be combined with clinical  observations, patient history, and epidemiological information. If result is POSITIVE SARS-CoV-2 target nucleic acids are DETECTED. The SARS-CoV-2 RNA is generally detectable in upper and  lower  respiratory specimens dur ing the acute phase of infection.  Positive  results are indicative of active infection with SARS-CoV-2.  Clinical  correlation with patient history and other diagnostic information is  necessary to determine patient infection status.  Positive results do  not rule out bacterial infection or co-infection with other viruses. If result is PRESUMPTIVE POSTIVE SARS-CoV-2 nucleic acids MAY BE PRESENT.   A presumptive positive result was obtained on the submitted specimen  and confirmed on repeat testing.  While 2019 novel coronavirus  (SARS-CoV-2) nucleic acids may be present in the submitted sample  additional confirmatory testing may be necessary for epidemiological  and / or clinical management purposes  to differentiate between  SARS-CoV-2 and other Sarbecovirus currently known to infect humans.  If clinically indicated additional testing with an alternate test  methodology [phone]) is advised. The SARS-CoV-2 RNA is generally  detectable in upper and lower respiratory sp ecimens during the acute  phase of infection. The expected result is Negative. Fact Sheet for Patients:  StrictlyIdeas.no Fact Sheet for Healthcare Providers: BankingDealers.co.za This test is not yet approved or cleared by the [place] FDA and has been authorized for detection and/or diagnosis of SARS-CoV-2 by FDA under an Emergency Use Authorization (EUA).  This EUA will remain in effect (meaning this test can be used) for the duration of the COVID-19 declaration under Section 564(b)(1) of the Act, 21 U.S.C. section 360bbb-3(b)(1), unless the authorization is terminated or revoked sooner. Performed at [facility], [address] [place], [place] [address]   Lactic acid, plasma     Status: None   Collection Time: 02/28/19  4:53 AM  Result Value Ref Range   Lactic Acid, Venous 0.9 0.5 - 1.9 mmol/L    Comment:  Performed at [facility], [address] [place], [place] [address]  Glucose, capillary     Status: Abnormal   Collection Time: 02/28/19  7:32 AM  Result Value Ref Range   Glucose-Capillary 132 (H) 70 - 99 mg/dL   Ct Head Wo Contrast  Result Date: 02/28/2019 CLINICAL DATA:  [place] at home, fever EXAM: CT HEAD WITHOUT CONTRAST CT CERVICAL SPINE WITHOUT CONTRAST TECHNIQUE: Multidetector CT imaging of the head and cervical spine was performed following the standard protocol without intravenous contrast. Multiplanar CT image reconstructions of the cervical spine were also generated. COMPARISON:  None. FINDINGS: CT HEAD FINDINGS Brain: No acute territorial infarction, hemorrhage or intracranial mass. Mild atrophy and small vessel ischemic changes of the white matter. Punctate calcification with in the pons without associated mass effect. Vascular: No hyperdense vessel.  Carotid vascular calcification Skull: Normal. Negative for  fracture or focal lesion. Sinuses/Orbits: Mucosal thickening in the ethmoid sinuses Other: None CT CERVICAL SPINE FINDINGS Alignment: Straightening of the cervical spine. No subluxation. Facet alignment within normal limits. Skull base and vertebrae: No acute fracture. No primary bone lesion or focal pathologic process. Soft tissues and spinal canal: No prevertebral fluid or swelling. No visible canal hematoma. Disc levels: Moderate degenerative changes at C4-C5 and C5-C6. Multiple level bilateral facet degenerative change. Upper chest: Negative. Other: None IMPRESSION: 1. No CT evidence for acute intracranial abnormality. Atrophy and mild small vessel ischemic changes of the white matter 2. Straightening of the cervical spine with degenerative change. No acute osseous abnormality Electronically Signed   By: Donavan Foil M.D.   On: 02/28/2019 02:38   Ct Cervical Spine Wo Contrast  Result Date: 02/28/2019 CLINICAL DATA:  [place] [address] at home, fever EXAM: CT HEAD WITHOUT CONTRAST CT  CERVICAL SPINE WITHOUT CONTRAST TECHNIQUE: Multidetector CT imaging of the head and cervical spine was performed following the standard protocol without intravenous contrast. Multiplanar CT image reconstructions of the cervical spine were also generated. COMPARISON:  None. FINDINGS: CT HEAD FINDINGS Brain: No acute territorial infarction, hemorrhage or intracranial mass. Mild atrophy and small vessel ischemic changes of the white matter. Punctate calcification with in the pons without associated mass effect. Vascular: No hyperdense vessel.  Carotid vascular calcification Skull: Normal. Negative for fracture or focal lesion. Sinuses/Orbits: Mucosal thickening in the ethmoid sinuses Other: None CT CERVICAL SPINE FINDINGS Alignment: Straightening of the cervical spine. No subluxation. Facet alignment within normal limits. Skull base and vertebrae: No acute fracture. No primary bone lesion or focal pathologic process. Soft tissues and spinal canal: No prevertebral fluid or swelling. No visible canal hematoma. Disc levels: Moderate degenerative changes at C4-C5 and C5-C6. Multiple level bilateral facet degenerative change. Upper chest: Negative. Other: None IMPRESSION: 1. No CT evidence for acute intracranial abnormality. Atrophy and mild small vessel ischemic changes of the white matter 2. Straightening of the cervical spine with degenerative change. No acute osseous abnormality Electronically Signed   By: Donavan Foil M.D.   On: 02/28/2019 02:38   US Venous Img Lower Bilateral  Result Date: 02/28/2019 CLINICAL DATA:  Bilateral leg edema EXAM: BILATERAL LOWER EXTREMITY VENOUS DOPPLER ULTRASOUND TECHNIQUE: Gray-scale sonography with graded compression, as well as color Doppler and duplex ultrasound were performed to evaluate the lower extremity deep venous systems from the level of the common femoral vein and including the common femoral, femoral, profunda femoral, popliteal and calf veins including the posterior  tibial, peroneal and gastrocnemius veins when visible. The superficial great saphenous vein was also interrogated. Spectral Doppler was utilized to evaluate flow at rest and with distal augmentation maneuvers in the common femoral, femoral and popliteal veins. COMPARISON:  None. FINDINGS: RIGHT LOWER EXTREMITY Common Femoral Vein: No evidence of thrombus. Normal compressibility, respiratory phasicity and response to augmentation. Saphenofemoral Junction: No evidence of thrombus. Normal compressibility and flow on color Doppler imaging. Profunda Femoral Vein: No evidence of thrombus. Normal compressibility and flow on color Doppler imaging. Femoral Vein: No evidence of thrombus. Normal compressibility, respiratory phasicity and response to augmentation. Popliteal Vein: No evidence of thrombus. Normal compressibility, respiratory phasicity and response to augmentation. Calf Veins: No evidence of thrombus. Normal compressibility and flow on color Doppler imaging. LEFT LOWER EXTREMITY Common Femoral Vein: No evidence of thrombus. Normal compressibility, respiratory phasicity and response to augmentation. Saphenofemoral Junction: No evidence of thrombus. Normal compressibility and flow on color Doppler imaging. Profunda Femoral Vein: No evidence of  thrombus. Normal compressibility and flow on color Doppler imaging. Femoral Vein: No evidence of thrombus. Normal compressibility, respiratory phasicity and response to augmentation. Popliteal Vein: No evidence of thrombus. Normal compressibility, respiratory phasicity and response to augmentation. Calf Veins: No evidence of thrombus. Normal compressibility and flow on color Doppler imaging. IMPRESSION: No evidence of deep venous thrombosis in either lower extremity. Electronically Signed   By: Donavan Foil M.D.   On: 02/28/2019 03:27   Dg Chest Port 1 View  Result Date: 02/28/2019 CLINICAL DATA:  Fall, fever EXAM: PORTABLE CHEST 1 VIEW COMPARISON:  None. FINDINGS: Vague  rounded opacity in the right mid lung. No pleural effusion. Normal heart size. Aortic atherosclerosis. IMPRESSION: Vague rounded opacity in the right mid lung could represent a focal area of pneumonia possibly a nodule. Further evaluation with CT is suggested Electronically Signed   By: Donavan Foil M.D.   On: 02/28/2019 02:14    Review of Systems  Constitutional: Positive for fever. Negative for chills.  HENT: Negative for sore throat and tinnitus.   Eyes: Negative for blurred vision and redness.  Respiratory: Negative for cough and shortness of breath.   Cardiovascular: Negative for chest pain, palpitations, orthopnea and PND.  Gastrointestinal: Negative for abdominal pain, diarrhea, nausea and vomiting.  Genitourinary: Negative for dysuria, frequency and urgency.  Musculoskeletal: Positive for falls. Negative for joint pain and myalgias.  Skin: Negative for rash.       No lesions  Neurological: Positive for weakness. Negative for speech change and focal weakness.  Endo/Heme/Allergies: Does not bruise/bleed easily.       No temperature intolerance  Psychiatric/Behavioral: Negative for depression and suicidal ideas.    Blood pressure (!) 123/39, pulse 90, temperature 99 F (37.2 C), temperature source Oral, resp. rate (!) 26, height '5\' 6"'  (1.676 m), weight 67.6 kg, SpO2 92 %. Physical Exam  Vitals reviewed. Constitutional: She is oriented to person, place, and time. She appears well-developed and well-nourished. No distress.  HENT:  Head: Normocephalic and atraumatic.  Mouth/Throat: Oropharynx is clear and moist.  Eyes: Pupils are equal, round, and reactive to light. Conjunctivae and EOM are normal. No scleral icterus.  Neck: Normal range of motion. Neck supple. No JVD present. No tracheal deviation present. No thyromegaly present.  Cardiovascular: Normal rate, regular rhythm and normal heart sounds. Exam reveals no gallop and no friction rub.  No murmur heard. Respiratory: Effort  normal and breath sounds normal.  GI: Soft. Bowel sounds are normal. She exhibits no distension. There is no abdominal tenderness.  Genitourinary:    Genitourinary Comments: Deferred   Musculoskeletal: Normal range of motion.        General: No edema.  Lymphadenopathy:    She has no cervical adenopathy.  Neurological: She is alert and oriented to person, place, and time. No cranial nerve deficit. She exhibits normal muscle tone.  Skin: Skin is warm and dry. No rash noted. No erythema.  Psychiatric: She has a normal mood and affect. Her behavior is normal. Judgment and thought content normal.     Assessment/Plan This is an 85 year old female admitted for sepsis. 1.  Sepsis: The patient meets criteria via fever, tachycardia, tachypnea and leukocytosis.  Lactic acid is within normal limits.  The patient is hemodynamically stable.  Source appears to be lungs.  Continue ceftriaxone and azithromycin.  Follow blood cultures for growth and sensitivities.  Obtain sputum sample if possible. 2.  Pneumonia: Community-acquired; antibiotics as above.  Supplemental oxygen as needed. 3.  Hypertension: The  patient's blood pressure is stable and she does not carry this diagnosis but she does take metoprolol which we may continue for her heart rate.  Discontinue if pressure becomes too soft. 4.  Diabetes mellitus type 2: Hold oral hypoglycemic agents except Januvia (may substitute linagliptin).  Sliding scale insulin while hospitalized 5.  DVT prophylaxis: Lovenox 6.  GI prophylaxis: None The patient is a full code.  Time spent on admission orders and patient care approximately 45 minutes  Harrie Foreman, MD 02/28/2019, 7:42 AM

## 2019-02-28 NOTE — Progress Notes (Signed)
PT Cancellation Note  Patient Details Name: Jordan Montoya MRN: [id number] DOB: 03/10/1934   Cancelled Treatment:    Reason Eval/Treat Not Completed: (PT entered room, pt sleeping, easily woken. PT and pt discussed pt events overnight and falls at home, pt fatigue. Pt requesting PT to re-attempt this PM or tomorrow to allow her to rest. PT will follow up as able.)  Lieutenant Diego PT, DPT 11:27 AM,02/28/19 [phone]

## 2019-02-28 NOTE — Progress Notes (Addendum)
[facility] [place] [facility]   PATIENT NAME: Jordan Montoya    MR#:  [id number]  DATE OF BIRTH:  20-Nov-1933  SUBJECTIVE:  CHIEF COMPLAINT:   Chief Complaint  Patient presents with   Fever   Fall   -Still feels extremely weak and tired.  Complaining of abdominal pain this morning  REVIEW OF SYSTEMS:  Review of Systems  Constitutional: Positive for malaise/fatigue. Negative for chills and fever.  HENT: Negative for congestion, ear discharge, hearing loss and nosebleeds.   Eyes: Negative for blurred vision and double vision.  Respiratory: Negative for cough, shortness of breath and wheezing.   Cardiovascular: Negative for chest pain, palpitations and leg swelling.  Gastrointestinal: Positive for abdominal pain. Negative for constipation, diarrhea, nausea and vomiting.  Genitourinary: Negative for dysuria.  Musculoskeletal: Positive for falls and myalgias.  Neurological: Positive for weakness. Negative for dizziness, seizures and headaches.  Psychiatric/Behavioral: Negative for depression.    DRUG ALLERGIES:   Allergies  Allergen Reactions   Metformin Nausea Only    VITALS:  Blood pressure (!) 123/42, pulse 73, temperature 98.7 F (37.1 C), temperature source Oral, resp. rate (!) 26, height 5\' 6"  (1.676 m), weight 67.6 kg, SpO2 92 %.  PHYSICAL EXAMINATION:  Physical Exam   GENERAL:  85 y.o.-year-old elderly patient lying in the bed with no acute distress.  EYES: Pupils equal, round, reactive to light and accommodation. No scleral icterus. Extraocular muscles intact.  HEENT: Head atraumatic, normocephalic. Oropharynx and nasopharynx clear.  NECK:  Supple, no jugular venous distention. No thyroid enlargement, no tenderness.  LUNGS: Normal breath sounds bilaterally, no wheezing, rales,rhonchi or crepitation. No use of accessory muscles of respiration.  Decreased bibasilar breath sounds CARDIOVASCULAR: S1, S2 normal. No   rubs, or gallops.  2/6  systolic murmur present ABDOMEN: Soft, nontender, nondistended. Bowel sounds present. No organomegaly or mass.  EXTREMITIES: No pedal edema, cyanosis, or clubbing.  NEUROLOGIC: Cranial nerves II through XII are intact. Muscle strength 5/5 in all extremities. Sensation intact. Gait not checked.  Generalized weakness PSYCHIATRIC: The patient is alert and oriented x 3.  SKIN: No obvious rash, lesion, or ulcer.    LABORATORY PANEL:   CBC Recent Labs  Lab 02/28/19 0138  WBC 13.9*  HGB 13.2  HCT 39.0  PLT 249   ------------------------------------------------------------------------------------------------------------------  Chemistries  Recent Labs  Lab 02/28/19 0138  NA 137  K 3.6  CL 103  CO2 24  GLUCOSE 155*  BUN 29*  CREATININE 0.72  CALCIUM 9.2  AST 21  ALT 18  ALKPHOS 61  BILITOT 0.9   ------------------------------------------------------------------------------------------------------------------  Cardiac Enzymes No results for input(s): TROPONINI in the last 168 hours. ------------------------------------------------------------------------------------------------------------------  RADIOLOGY:  Ct Head Wo Contrast  Result Date: 02/28/2019 CLINICAL DATA:  [place]at[place]ell at home, fever EXAM: CT HEAD WITHOUT CONTRAST CT CERVICAL SPINE WITHOUT CONTRAST TECHNIQUE: Multidetector CT imaging of the head and cervical spine was performed following the standard protocol without intravenous contrast. Multiplanar CT image reconstructions of the cervical spine were also generated. COMPARISON:  None. FINDINGS: CT HEAD FINDINGS Brain: No acute territorial infarction, hemorrhage or intracranial mass. Mild atrophy and small vessel ischemic changes of the white matter. Punctate calcification with in the pons without associated mass effect. Vascular: No hyperdense vessel.  Carotid vascular calcification Skull: Normal. Negative for fracture or focal lesion. Sinuses/Orbits: Mucosal thickening in  the ethmoid sinuses Other: None CT CERVICAL SPINE FINDINGS Alignment: Straightening of the cervical spine. No subluxation. Facet alignment within normal limits. Skull  base and vertebrae: No acute fracture. No primary bone lesion or focal pathologic process. Soft tissues and spinal canal: No prevertebral fluid or swelling. No visible canal hematoma. Disc levels: Moderate degenerative changes at C4-C5 and C5-C6. Multiple level bilateral facet degenerative change. Upper chest: Negative. Other: None IMPRESSION: 1. No CT evidence for acute intracranial abnormality. Atrophy and mild small vessel ischemic changes of the white matter 2. Straightening of the cervical spine with degenerative change. No acute osseous abnormality Electronically Signed   By: Donavan Foil M.D.   On: 02/28/2019 02:38   Ct Cervical Spine Wo Contrast  Result Date: 02/28/2019 CLINICAL DATA:  [place] at home, fever EXAM: CT HEAD WITHOUT CONTRAST CT CERVICAL SPINE WITHOUT CONTRAST TECHNIQUE: Multidetector CT imaging of the head and cervical spine was performed following the standard protocol without intravenous contrast. Multiplanar CT image reconstructions of the cervical spine were also generated. COMPARISON:  None. FINDINGS: CT HEAD FINDINGS Brain: No acute territorial infarction, hemorrhage or intracranial mass. Mild atrophy and small vessel ischemic changes of the white matter. Punctate calcification with in the pons without associated mass effect. Vascular: No hyperdense vessel.  Carotid vascular calcification Skull: Normal. Negative for fracture or focal lesion. Sinuses/Orbits: Mucosal thickening in the ethmoid sinuses Other: None CT CERVICAL SPINE FINDINGS Alignment: Straightening of the cervical spine. No subluxation. Facet alignment within normal limits. Skull base and vertebrae: No acute fracture. No primary bone lesion or focal pathologic process. Soft tissues and spinal canal: No prevertebral fluid or swelling. No visible canal hematoma.  Disc levels: Moderate degenerative changes at C4-C5 and C5-C6. Multiple level bilateral facet degenerative change. Upper chest: Negative. Other: None IMPRESSION: 1. No CT evidence for acute intracranial abnormality. Atrophy and mild small vessel ischemic changes of the white matter 2. Straightening of the cervical spine with degenerative change. No acute osseous abnormality Electronically Signed   By: Donavan Foil M.D.   On: 02/28/2019 02:38   US Venous Img Lower Bilateral  Result Date: 02/28/2019 CLINICAL DATA:  Bilateral leg edema EXAM: BILATERAL LOWER EXTREMITY VENOUS DOPPLER ULTRASOUND TECHNIQUE: Gray-scale sonography with graded compression, as well as color Doppler and duplex ultrasound were performed to evaluate the lower extremity deep venous systems from the level of the common femoral vein and including the common femoral, femoral, profunda femoral, popliteal and calf veins including the posterior tibial, peroneal and gastrocnemius veins when visible. The superficial great saphenous vein was also interrogated. Spectral Doppler was utilized to evaluate flow at rest and with distal augmentation maneuvers in the common femoral, femoral and popliteal veins. COMPARISON:  None. FINDINGS: RIGHT LOWER EXTREMITY Common Femoral Vein: No evidence of thrombus. Normal compressibility, respiratory phasicity and response to augmentation. Saphenofemoral Junction: No evidence of thrombus. Normal compressibility and flow on color Doppler imaging. Profunda Femoral Vein: No evidence of thrombus. Normal compressibility and flow on color Doppler imaging. Femoral Vein: No evidence of thrombus. Normal compressibility, respiratory phasicity and response to augmentation. Popliteal Vein: No evidence of thrombus. Normal compressibility, respiratory phasicity and response to augmentation. Calf Veins: No evidence of thrombus. Normal compressibility and flow on color Doppler imaging. LEFT LOWER EXTREMITY Common Femoral Vein: No  evidence of thrombus. Normal compressibility, respiratory phasicity and response to augmentation. Saphenofemoral Junction: No evidence of thrombus. Normal compressibility and flow on color Doppler imaging. Profunda Femoral Vein: No evidence of thrombus. Normal compressibility and flow on color Doppler imaging. Femoral Vein: No evidence of thrombus. Normal compressibility, respiratory phasicity and response to augmentation. Popliteal Vein: No evidence of thrombus. Normal compressibility,  respiratory phasicity and response to augmentation. Calf Veins: No evidence of thrombus. Normal compressibility and flow on color Doppler imaging. IMPRESSION: No evidence of deep venous thrombosis in either lower extremity. Electronically Signed   By: Jasmine PangKim  Fujinaga M.D.   On: 02/28/2019 03:27   Dg Chest Port 1 View  Result Date: 02/28/2019 CLINICAL DATA:  Fall, fever EXAM: PORTABLE CHEST 1 VIEW COMPARISON:  None. FINDINGS: Vague rounded opacity in the right mid lung. No pleural effusion. Normal heart size. Aortic atherosclerosis. IMPRESSION: Vague rounded opacity in the right mid lung could represent a focal area of pneumonia possibly a nodule. Further evaluation with CT is suggested Electronically Signed   By: Jasmine PangKim  Fujinaga M.D.   On: 02/28/2019 02:14    EKG:   Orders placed or performed during the hospital encounter of 02/28/19   EKG 12-Lead   EKG 12-Lead   ED EKG 12-Lead   ED EKG 12-Lead    ASSESSMENT AND PLAN:   85 year old female with past medical history significant for major depressive disorder, history of shingles along the left flank to anterior abdominal wall- treated, diabetes presents to hospital secondary to fall and weakness  1.  Sepsis-patient admitted with sepsis, likely secondary to community-acquired pneumonia. -Chest x-ray with possible right middle lobe infiltrate, concern for viral infection as well -COVID-19 is negative.  She is started on Rocephin and azithromycin -Had high fever and  tachycardia and leukocytosis on admission -Not on any oxygen at this time.  Lactic acid is within normal limits -Continue current treatment  2.  Fall-CT of the head and C-spine without any acute findings. -Physical therapy consulted  3.  Postherpetic neuralgia-patient already on Lyrica at home.  We will add capsaicin cream  4.  Diabetes mellitus-on Tradjenta and sliding scale insulin.  5.  DVT prophylaxis-on Lovenox  Updated son Jordan Montoya over the phone today   All the records are reviewed and case discussed with Care Management/Social Workerr. Management plans discussed with the patient, family and they are in agreement.  CODE STATUS: Full code  TOTAL TIME TAKING CARE OF THIS PATIENT: 38 minutes.   POSSIBLE D/C IN 2 DAYS, DEPENDING ON CLINICAL CONDITION.   Enid Baasadhika Nicholos Aloisi M.D on 02/28/2019 at 1:18 PM  Between 7am to 6pm - Pager - [phone]  After 6pm go to [email or web address] - Social research officer, governmentpassword [email or web address]  [facility] [place] [facility]  Office  [phone]  CC: Primary care physician; Reubin MilanBerglund, Laura H, MD

## 2019-02-28 NOTE — ED Notes (Signed)
Patient transported to CT 

## 2019-03-01 ENCOUNTER — Inpatient Hospital Stay: Payer: Medicare Other

## 2019-03-01 LAB — GLUCOSE, CAPILLARY
Glucose-Capillary: 75 mg/dL (ref 70–99)
Glucose-Capillary: 113 mg/dL — ABNORMAL HIGH (ref 70–99)
Glucose-Capillary: 108 mg/dL — ABNORMAL HIGH (ref 70–99)

## 2019-03-01 LAB — CBC
HCT: 31.6 % — ABNORMAL LOW (ref 36.0–46.0)
Hemoglobin: 10.2 g/dL — ABNORMAL LOW (ref 12.0–15.0)
MCH: 31.9 pg (ref 26.0–34.0)
MCHC: 32.3 g/dL (ref 30.0–36.0)
MCV: 98.8 fL (ref 80.0–100.0)
Platelets: 190 10*3/uL (ref 150–400)
RBC: 3.2 MIL/uL — ABNORMAL LOW (ref 3.87–5.11)
RDW: 14 % (ref 11.5–15.5)
WBC: 10 10*3/uL (ref 4.0–10.5)
nRBC: 0 % (ref 0.0–0.2)

## 2019-03-01 LAB — URINE CULTURE: Culture: NO GROWTH

## 2019-03-01 MED ORDER — CLOBETASOL PROPIONATE 0.05 % EX CREA
TOPICAL_CREAM | Freq: Two times a day (BID) | CUTANEOUS | Status: DC
  Administered 2019-03-01 – 2019-03-02 (×3): via TOPICAL
  Filled 2019-03-01: qty 15

## 2019-03-01 MED ORDER — PREGABALIN 50 MG PO CAPS
100.0000 mg | ORAL_CAPSULE | Freq: Two times a day (BID) | ORAL | Status: DC
  Administered 2019-03-01 – 2019-03-02 (×2): 100 mg via ORAL
  Filled 2019-03-01 (×2): qty 2

## 2019-03-01 NOTE — Progress Notes (Signed)
[facility] - [place] at [facility]   PATIENT NAME: Jordan Montoya    MR#:  [id number]  DATE OF BIRTH:  [birth year]  SUBJECTIVE:  CHIEF COMPLAINT:   Chief Complaint  Patient presents with   Fever   Fall   -Patient feels very weak.  Over the last 2 months she has noticed that her legs are getting heavier and she is unable to walk  REVIEW OF SYSTEMS:  Review of Systems  Constitutional: Positive for malaise/fatigue. Negative for chills and fever.  HENT: Negative for congestion, ear discharge, hearing loss and nosebleeds.   Eyes: Negative for blurred vision and double vision.  Respiratory: Negative for cough, shortness of breath and wheezing.   Cardiovascular: Negative for chest pain, palpitations and leg swelling.  Gastrointestinal: Negative for abdominal pain, constipation, diarrhea, nausea and vomiting.  Genitourinary: Negative for dysuria.  Musculoskeletal: Positive for falls and myalgias.  Neurological: Positive for weakness. Negative for dizziness, seizures and headaches.  Psychiatric/Behavioral: Negative for depression.    DRUG ALLERGIES:   Allergies  Allergen Reactions   Metformin Nausea Only    VITALS:  Blood pressure (!) 143/52, pulse 74, temperature 97.7 F (36.5 C), temperature source Oral, resp. rate 17, height 5\' 6"  (1.676 m), weight 70.4 kg, SpO2 96 %.  PHYSICAL EXAMINATION:  Physical Exam   GENERAL:  85 y.o.-year-old elderly patient lying in the bed with no acute distress.  EYES: Pupils equal, round, reactive to light and accommodation. No scleral icterus. Extraocular muscles intact.  HEENT: Head atraumatic, normocephalic. Oropharynx and nasopharynx clear.  NECK:  Supple, no jugular venous distention. No thyroid enlargement, no tenderness.  LUNGS: Normal breath sounds bilaterally, no wheezing, rales,rhonchi or crepitation. No use of accessory muscles of respiration.  Decreased bibasilar breath sounds CARDIOVASCULAR: S1, S2 normal. No    rubs, or gallops.  2/6 systolic murmur present ABDOMEN: Soft, nontender, nondistended. Bowel sounds present. No organomegaly or mass.  EXTREMITIES: No pedal edema, cyanosis, or clubbing.  NEUROLOGIC: Cranial nerves II through XII are intact. Muscle strength equal in both upper extremities, 4/5 in both lower extremities with hyporeflexia.  Sensation intact. Gait not checked.  Generalized weakness PSYCHIATRIC: The patient is alert and oriented x 3.  SKIN: No obvious rash, lesion, or ulcer.    LABORATORY PANEL:   CBC Recent Labs  Lab 03/01/19 0502  WBC 10.0  HGB 10.2*  HCT 31.6*  PLT 190   ------------------------------------------------------------------------------------------------------------------  Chemistries  Recent Labs  Lab 02/28/19 0138  NA 137  K 3.6  CL 103  CO2 24  GLUCOSE 155*  BUN 29*  CREATININE 0.72  CALCIUM 9.2  AST 21  ALT 18  ALKPHOS 61  BILITOT 0.9   ------------------------------------------------------------------------------------------------------------------  Cardiac Enzymes No results for input(s): TROPONINI in the last 168 hours. ------------------------------------------------------------------------------------------------------------------  RADIOLOGY:  Ct Head Wo Contrast  Result Date: 02/28/2019 CLINICAL DATA:  [place] at home, fever EXAM: CT HEAD WITHOUT CONTRAST CT CERVICAL SPINE WITHOUT CONTRAST TECHNIQUE: Multidetector CT imaging of the head and cervical spine was performed following the standard protocol without intravenous contrast. Multiplanar CT image reconstructions of the cervical spine were also generated. COMPARISON:  None. FINDINGS: CT HEAD FINDINGS Brain: No acute territorial infarction, hemorrhage or intracranial mass. Mild atrophy and small vessel ischemic changes of the white matter. Punctate calcification with in the pons without associated mass effect. Vascular: No hyperdense vessel.  Carotid vascular calcification Skull:  Normal. Negative for fracture or focal lesion. Sinuses/Orbits: Mucosal thickening in the ethmoid sinuses Other: None  CT CERVICAL SPINE FINDINGS Alignment: Straightening of the cervical spine. No subluxation. Facet alignment within normal limits. Skull base and vertebrae: No acute fracture. No primary bone lesion or focal pathologic process. Soft tissues and spinal canal: No prevertebral fluid or swelling. No visible canal hematoma. Disc levels: Moderate degenerative changes at C4-C5 and C5-C6. Multiple level bilateral facet degenerative change. Upper chest: Negative. Other: None IMPRESSION: 1. No CT evidence for acute intracranial abnormality. Atrophy and mild small vessel ischemic changes of the white matter 2. Straightening of the cervical spine with degenerative change. No acute osseous abnormality Electronically Signed   By: Donavan Foil M.D.   On: 02/28/2019 02:38   Ct Cervical Spine Wo Contrast  Result Date: 02/28/2019 CLINICAL DATA:  [place] at [place], fever EXAM: CT HEAD WITHOUT CONTRAST CT CERVICAL SPINE WITHOUT CONTRAST TECHNIQUE: Multidetector CT imaging of the head and cervical spine was performed following the standard protocol without intravenous contrast. Multiplanar CT image reconstructions of the cervical spine were also generated. COMPARISON:  None. FINDINGS: CT HEAD FINDINGS Brain: No acute territorial infarction, hemorrhage or intracranial mass. Mild atrophy and small vessel ischemic changes of the white matter. Punctate calcification with in the pons without associated mass effect. Vascular: No hyperdense vessel.  Carotid vascular calcification Skull: Normal. Negative for fracture or focal lesion. Sinuses/Orbits: Mucosal thickening in the ethmoid sinuses Other: None CT CERVICAL SPINE FINDINGS Alignment: Straightening of the cervical spine. No subluxation. Facet alignment within normal limits. Skull base and vertebrae: No acute fracture. No primary bone lesion or focal pathologic process. Soft  tissues and spinal canal: No prevertebral fluid or swelling. No visible canal hematoma. Disc levels: Moderate degenerative changes at C4-C5 and C5-C6. Multiple level bilateral facet degenerative change. Upper chest: Negative. Other: None IMPRESSION: 1. No CT evidence for acute intracranial abnormality. Atrophy and mild small vessel ischemic changes of the white matter 2. Straightening of the cervical spine with degenerative change. No acute osseous abnormality Electronically Signed   By: Donavan Foil M.D.   On: 02/28/2019 02:38   US Venous Img Lower Bilateral  Result Date: 02/28/2019 CLINICAL DATA:  Bilateral leg edema EXAM: BILATERAL LOWER EXTREMITY VENOUS DOPPLER ULTRASOUND TECHNIQUE: Gray-scale sonography with graded compression, as well as color Doppler and duplex ultrasound were performed to evaluate the lower extremity deep venous systems from the level of the common femoral vein and including the common femoral, femoral, profunda femoral, popliteal and calf veins including the posterior tibial, peroneal and gastrocnemius veins when visible. The superficial great saphenous vein was also interrogated. Spectral Doppler was utilized to evaluate flow at rest and with distal augmentation maneuvers in the common femoral, femoral and popliteal veins. COMPARISON:  None. FINDINGS: RIGHT LOWER EXTREMITY Common Femoral Vein: No evidence of thrombus. Normal compressibility, respiratory phasicity and response to augmentation. Saphenofemoral Junction: No evidence of thrombus. Normal compressibility and flow on color Doppler imaging. Profunda Femoral Vein: No evidence of thrombus. Normal compressibility and flow on color Doppler imaging. Femoral Vein: No evidence of thrombus. Normal compressibility, respiratory phasicity and response to augmentation. Popliteal Vein: No evidence of thrombus. Normal compressibility, respiratory phasicity and response to augmentation. Calf Veins: No evidence of thrombus. Normal  compressibility and flow on color Doppler imaging. LEFT LOWER EXTREMITY Common Femoral Vein: No evidence of thrombus. Normal compressibility, respiratory phasicity and response to augmentation. Saphenofemoral Junction: No evidence of thrombus. Normal compressibility and flow on color Doppler imaging. Profunda Femoral Vein: No evidence of thrombus. Normal compressibility and flow on color Doppler imaging. Femoral Vein: No evidence  of thrombus. Normal compressibility, respiratory phasicity and response to augmentation. Popliteal Vein: No evidence of thrombus. Normal compressibility, respiratory phasicity and response to augmentation. Calf Veins: No evidence of thrombus. Normal compressibility and flow on color Doppler imaging. IMPRESSION: No evidence of deep venous thrombosis in either lower extremity. Electronically Signed   By: Jasmine PangKim  Fujinaga M.D.   On: 02/28/2019 03:27   Dg Chest Port 1 View  Result Date: 02/28/2019 CLINICAL DATA:  Fall, fever EXAM: PORTABLE CHEST 1 VIEW COMPARISON:  None. FINDINGS: Vague rounded opacity in the right mid lung. No pleural effusion. Normal heart size. Aortic atherosclerosis. IMPRESSION: Vague rounded opacity in the right mid lung could represent a focal area of pneumonia possibly a nodule. Further evaluation with CT is suggested Electronically Signed   By: Jasmine PangKim  Fujinaga M.D.   On: 02/28/2019 02:14    EKG:   Orders placed or performed during the hospital encounter of 02/28/19   EKG 12-Lead   EKG 12-Lead   ED EKG 12-Lead   ED EKG 12-Lead    ASSESSMENT AND PLAN:   85 year old female with past medical history significant for major depressive disorder, history of shingles along the left flank to anterior abdominal wall- treated, diabetes presents to hospital secondary to fall and weakness  1.  Sepsis-patient admitted with sepsis, likely secondary to community-acquired pneumonia. -Chest x-ray with possible right middle lobe infiltrate, concern for viral infection as  well -COVID-19 is negative.  on Rocephin and azithromycin-changed to oral antibiotics tomorrow -No further fevers, WBC is normalized -Not on any oxygen at this time.  Lactic acid is within normal limits -Continue current treatment  2.  Fall-CT of the head and C-spine without any acute findings. -Physical therapy consulted -Patient complains of increased numbness and heaviness in both legs over the last 2 months.  Lyrica is a new medication that was started for postherpetic neuralgia.  Decrease the dose of Lyrica and monitor. -CT of C-spine ordered as patient cannot tolerate MRI and has had multiple falls at home recently  3.  Postherpetic neuralgia-patient already on Lyrica at home.  Dose has been decreased given lower extremity symptoms. -Topical ointment prescribed by her dermatologist as she cannot tolerate capsaicin cream  4.  Diabetes mellitus-on Tradjenta and sliding scale insulin.  5.  DVT prophylaxis-on Lovenox  Physical therapy consulted.  Patient will discharge home with home health tomorrow   All the records are reviewed and case discussed with Care Management/Social Workerr. Management plans discussed with the patient, family and they are in agreement.  CODE STATUS: Full code  TOTAL TIME TAKING CARE OF THIS PATIENT: 38 minutes.   POSSIBLE D/C tomorrow, DEPENDING ON CLINICAL CONDITION.   Enid Baasadhika Yates Weisgerber M.D on 03/01/2019 at 12:40 PM  Between 7am to 6pm - Pager - [phone]  After 6pm go to [email or web address] - Social research officer, governmentpassword [email or web address]  [facility] [place] [facility]  Office  [phone]  CC: Primary care physician; Reubin MilanBerglund, Laura H, MD

## 2019-03-01 NOTE — TOC Initial Note (Addendum)
Transition of Care [facility]) - Initial/Assessment Note    Patient Details  Name: Jordan Montoya MRN: [id number] Date of Birth: 06/12/1934  Transition of Care [facility]) CM/SW Contact:    Candie Chroman, LCSW Phone Number: 03/01/2019, 9:40 AM  Clinical Narrative: CSW met with patient, introduced role, and explained that discharge planning would be discussed. Patient was initially considering SNF placement because she lives alone but she stated her family does not want her in a facility due to [place] restrictions. Her son lives nearby and he checks on her daily but he works and is unable to stay with her. Patient is agreeable to home health. She asked about getting 24/7 care but CSW explained that is an out-of-pocket expense. PT evaluation is pending. Patient has a life alert as well as a walker that she bought at a yard sale 7-8 years ago. She is hoping she can obtain a new walker and a bedside commode. No further concerns. CSW encouraged patient to contact CSW as needed. CSW will continue to follow patient for support and facilitate return home once stable.    4:06 pm: Met with patient and provided CMS scores for home health agencies that serve her area. She is not familiar with any of them and is agreeable to CSW starting with highest rating. So far [facility] and [facility] have said no. Alvis Lemmings said to call them if everyone else says no. Left message for [facility] representative. Patient said her son has agreed to stay with her at night for a while. He will pick her up tomorrow after work. He gets off at 3:00 pm.               Expected Discharge Plan: [place] Barriers to Discharge: Continued Medical Work up   Patient Goals and CMS Choice        Expected Discharge Plan and Services Expected Discharge Plan: [place] Acute Care Choice: Durable Medical Equipment, Home Health Living arrangements for the past 2 months: Single Family Home(Townhouse)                                       Prior Living Arrangements/Services Living arrangements for the past 2 months: Single Family Home(Townhouse) Lives with:: Self Patient language and need for interpreter reviewed:: Yes Do you feel safe going back to the place where you live?: Yes      Need for Family Participation in Patient Care: Yes (Comment) Care giver support system in place?: Yes (comment) Current home services: DME, Safety alert Criminal Activity/Legal Involvement Pertinent to Current Situation/Hospitalization: No - Comment as needed  Activities of Daily Living Home Assistive Devices/Equipment: Gilford Rile (specify type) ADL Screening (condition at time of admission) Patient's cognitive ability adequate to safely complete daily activities?: Yes Is the patient deaf or have difficulty hearing?: No Does the patient have difficulty seeing, even when wearing glasses/contacts?: No Does the patient have difficulty concentrating, remembering, or making decisions?: No Patient able to express need for assistance with ADLs?: Yes Does the patient have difficulty dressing or bathing?: No Independently performs ADLs?: Yes (appropriate for developmental age) Does the patient have difficulty walking or climbing stairs?: Yes Weakness of Legs: Both Weakness of Arms/Hands: None  Permission Sought/Granted                  Emotional Assessment Appearance:: Appears stated age  Attitude/Demeanor/Rapport: Engaged, Gracious Affect (typically observed): Accepting, Appropriate, Calm, Pleasant Orientation: : Oriented to Self, Oriented to Place, Oriented to  Time, Oriented to Situation Alcohol / Substance Use: Never Used Psych Involvement: No (comment)  Admission diagnosis:  Sepsis ([place]) [A41.9] Community acquired pneumonia, unspecified laterality [J18.9] Sepsis without acute organ dysfunction, due to unspecified organism [facility]) [A41.9] Patient Active Problem List   Diagnosis Date Noted  .  Sepsis ([place]) 02/28/2019  . Anxiety disorder 11/16/2018  . Primary osteoarthritis of both hands 08/16/2018  . Arthralgia of multiple sites 05/18/2018  . Pernicious anemia 12/29/2017  . 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 Environmental and seasonal allergies 05/30/2015  . Hip pain, chronic 01/27/2015  . Major depressive disorder with single episode, in partial remission ([place]) 11/25/2014  . Avitaminosis D 11/25/2014  . Psoriasis 11/25/2014  . Hyperlipidemia associated with type 2 diabetes mellitus ([place]) 08/20/2011   PCP:  Glean Hess, MD Pharmacy:   [id number] [id number]: [phone]: [phone]    Social Determinants of Health (SDOH) Interventions    Readmission Risk Interventions No flowsheet data found.

## 2019-03-01 NOTE — Evaluation (Signed)
Physical Therapy Evaluation Patient Details Name: Jordan Montoya MRN: [id number] DOB: 1933/10/26 Today's Date: 03/01/2019   History of Present Illness  85 year old female with past medical history significant for major depressive disorder, history of shingles along the left flank to anterior abdominal wall- treated, diabetes presents to hospital secondary to fall and weakness  Clinical Impression  Pt is able to ambulate in-home distances with slow, hesitant gait, though she is very weak in b/l LEs (R>L).  She states that she has been having relatively frequent falls since starting meds for shingles pain, states pain is very limiting but that she may want to taper down on the Lyrica as it seems to be effecting her (pt was active and independent ~5 months ago before shingles).  Pt's son does stop by QD and helps with groceries and meal, however pt likely does need increased in-home assist t/o the day, would benefit from HHPT, RW and possibly BSC.  Did discuss trying to get other family members to provide increased assistance.    Follow Up Recommendations Home health PT;Supervision - Intermittent(discussed having increased assistance around the home QD)    Equipment Recommendations  Rolling walker with 5" wheels;3in1 (PT)    Recommendations for Other Services       Precautions / Restrictions Precautions Precautions: Fall Restrictions Weight Bearing Restrictions: No      Mobility  Bed Mobility Overal bed mobility: Modified Independent             General bed mobility comments: Pt was able to get herself to EOB, did show some pain in L side and hesitation needing extra time and cuing.  Transfers Overall transfer level: Modified independent Equipment used: Rolling walker (2 wheeled)             General transfer comment: Pt was able to rise to standing w/o physical assist, however fearful and reliant on the walker.    Ambulation/Gait Ambulation/Gait assistance: Min  guard Gait Distance (Feet): 55 Feet Assistive device: Rolling walker (2 wheeled)       General Gait Details: Pt leaning on the walker but able to maintain slow but consistent cadence.  Overall she was able to ambulate relatively safely with some R LE weakness/hesitancy and overall limited in in-home safety tolerance  Stairs            Wheelchair Mobility    Modified Rankin (Stroke Patients Only)       Balance Overall balance assessment: Modified Independent                                           Pertinent Vitals/Pain Pain Assessment: 0-10 Pain Score: 6  Pain Location: L lower quadrent shingles pain    Home Living Family/patient expects to be discharged to:: Private residence Living Arrangements: Alone Available Help at Discharge: Family(son stops by QD, does pay weekly help cleaning the home ) Type of Home: House         Home Equipment: (old RW )      Prior Function Level of Independence: Independent         Comments: Pt reports that prior to starting meds for shingles she was fully independent and active, recent months have been in-home ambulation only with difficulty     Hand Dominance        Extremity/Trunk Assessment   Upper Extremity Assessment Upper Extremity Assessment: Generalized weakness  Lower Extremity Assessment Lower Extremity Assessment: Generalized weakness(R LE grossly 3+/5, L 4-/5, numbness proximally b/l, pain b/l)       Communication   Communication: No difficulties  Cognition Arousal/Alertness: Awake/alert Behavior During Therapy: WFL for tasks assessed/performed Overall Cognitive Status: Within Functional Limits for tasks assessed                                        General Comments      Exercises     Assessment/Plan    PT Assessment Patient needs continued PT services  PT Problem List Decreased strength;Decreased range of motion;Decreased activity tolerance;Decreased  balance;Decreased mobility;Decreased coordination;Decreased cognition;Decreased safety awareness;Decreased knowledge of use of DME;Pain       PT Treatment Interventions DME instruction;Gait training;Stair training;Functional mobility training;Therapeutic activities;Therapeutic exercise;Balance training;Neuromuscular re-education;Patient/family education    PT Goals (Current goals can be found in the Care Plan section)  Acute Rehab PT Goals Patient Stated Goal: get stronger, maybe taper down Lyrica as is seems to have made her weaker PT Goal Formulation: With patient Time For Goal Achievement: 03/15/19 Potential to Achieve Goals: Fair    Frequency Min 2X/week   Barriers to discharge        Co-evaluation               AM-PAC PT "6 Clicks" Mobility  Outcome Measure Help needed turning from your back to your side while in a flat bed without using bedrails?: None Help needed moving from lying on your back to sitting on the side of a flat bed without using bedrails?: A Little Help needed moving to and from a bed to a chair (including a wheelchair)?: A Little Help needed standing up from a chair using your arms (e.g., wheelchair or bedside chair)?: A Little Help needed to walk in hospital room?: A Little Help needed climbing 3-5 steps with a railing? : A Lot 6 Click Score: 18    End of Session Equipment Utilized During Treatment: Gait belt Activity Tolerance: Patient limited by fatigue Patient left: with call bell/phone within reach;with chair alarm set Nurse Communication: Mobility status PT Visit Diagnosis: Muscle weakness (generalized) (M62.81);Difficulty in walking, not elsewhere classified (R26.2);Pain Pain - Right/Left: Left Pain - part of body: Hip    Time: 0935-1006 PT Time Calculation (min) (ACUTE ONLY): 31 min   Charges:   PT Evaluation $PT Eval Low Complexity: 1 Low PT Treatments $Gait Training: 8-22 mins        Malachi ProGalen R Emmilyn Crooke, DPT 03/01/2019, 3:56  PM

## 2019-03-01 NOTE — Progress Notes (Signed)
Updated son [name] over the phone today.  Explained the possibility of discharge home with home health tomorrow.  Agreeable with plan.

## 2019-03-02 LAB — GLUCOSE, CAPILLARY
Glucose-Capillary: 104 mg/dL — ABNORMAL HIGH (ref 70–99)
Glucose-Capillary: 87 mg/dL (ref 70–99)
Glucose-Capillary: 118 mg/dL — ABNORMAL HIGH (ref 70–99)

## 2019-03-02 MED ORDER — CEFPODOXIME PROXETIL 200 MG PO TABS: 200.0000 mg | ORAL_TABLET | Freq: Two times a day (BID) | ORAL | 0 refills | Status: AC

## 2019-03-02 MED ORDER — TRAMADOL-ACETAMINOPHEN 37.5-325 MG PO TABS
1.0000 | ORAL_TABLET | Freq: Four times a day (QID) | ORAL | Status: DC | PRN
  Administered 2019-03-02: 1 via ORAL
  Filled 2019-03-02 (×2): qty 1

## 2019-03-02 MED ORDER — PREGABALIN 100 MG PO CAPS: 100.0000 mg | ORAL_CAPSULE | Freq: Two times a day (BID) | ORAL | 0 refills | Status: DC

## 2019-03-02 MED ORDER — TRAMADOL-ACETAMINOPHEN 37.5-325 MG PO TABS: 1.0000 | ORAL_TABLET | Freq: Four times a day (QID) | ORAL | 0 refills | Status: DC | PRN

## 2019-03-02 NOTE — TOC Transition Note (Signed)
Transition of Care [facility]) - CM/SW Discharge Note   Patient Details  Name: Keionna Kinnaird MRN: [id number] Date of Birth: 1933-12-01  Transition of Care [facility]) CM/SW Contact:  Candie Chroman, LCSW Phone Number: 03/02/2019, 10:54 AM   Clinical Narrative: Patient's discharge, home health, and DME orders are in. [place] and [facility] are aware. Patient's son will pick her up when he gets off work at 3:00. No further concerns. CSW signing off.    Final next level of care: Home w Home Health Services Barriers to Discharge: Barriers Resolved   Patient Goals and CMS Choice   CMS Medicare.gov Compare Post Acute Care list provided to:: Patient Choice offered to / list presented to : Patient  Discharge Placement                Patient to be transferred to facility by: Son will pick her up when he gets off work at 3:00.   Patient and family notified of of transfer: 03/02/19  Discharge Plan and Services     Post Acute Care Choice: Durable Medical Equipment, Home Health          DME Arranged: 3-N-1, Walker rolling DME Agency: [facility] Date DME Agency Contacted: 03/02/19   Representative spoke with at DME Agency: [place]: RN, PT, Nurse's Aide, Social Work [employer] Agency: [place] Date [place]: 03/02/19   Representative spoke with at [place]: [place] ([place]) Interventions     Readmission Risk Interventions No flowsheet data found.

## 2019-03-02 NOTE — Care Management Important Message (Signed)
Important Message  Patient Details  Name: Jordan Montoya MRN: [id number] Date of Birth: 1933-08-18   Medicare Important Message Given:  Yes     Dannette Barbara 03/02/2019, 10:49 AM

## 2019-03-02 NOTE — Plan of Care (Signed)
Problem: Education: Goal: Knowledge of General Education information will improve Description: Including pain rating scale, medication(s)/side effects and non-pharmacologic comfort measures Outcome: Progressing Pt has an understanding of her diagnosis and POC r/t her hospitalization.  She is pending d/c in the AM.  Problem: Health Behavior/Discharge Planning: Goal: Ability to manage health-related needs will improve Outcome: Progressing Pt has an understanding of her hospitalization.  She is pending d/c in the AM.  Problem: Clinical Measurements: Goal: Will remain free from infection Outcome: Progressing S/Sx of infection monitor and assessed q-shift/ PRN, aong with VS.  Pt has remained afebrile thus far.  She is on abx per MD's orders.  Will continue to monitor and assess.   Problem: Clinical Measurements: Goal: Respiratory complications will improve Outcome: Progressing Respiratory status monitored and assessed q-shift/ PRN, along with VS.  Pt is on RA with O2 saturations at 94% and respiration rate of 18 breaths per minute.  Pt has not endorsed SOB or DOE.  Will continue to monitor and assess.   Problem: Clinical Measurements: Goal: Cardiovascular complication will be avoided Outcome: Progressing Pt has some BLE +2 edema.  She does not exhibit any other cardiac s/sx.  Will continue to monitor and assess.   Problem: Activity: Goal: Risk for activity intolerance will decrease Outcome: Progressing Pt is a x1 assist to the [facility].  She has not endorsed c/o SOB or DOE.  Will continue to monitor and assess.   Problem: Nutrition: Goal: Adequate nutrition will be maintained Outcome: Progressing Pt is on a heart healthy carb modified diet.  She BSBG are WNL.  Will continue to monitor and assess.   Problem: Elimination: Goal: Will not experience complications related to bowel motility Outcome: Progressing Pt has refused her stool softeners this evening.  She does not endorse c/o  constipation.  Reiterated the importance of having normal bowel habits.  Pt understands the risk.  Will continue to monitor and assess.   Problem: Elimination: Goal: Will not experience complications related to urinary retention Outcome: Progressing Pt is a x1 assist to the [facility].  She utilizes the RN call light or RN staff ascom phone to ask for assistance to the BR to void.  Her output is monitored.  Will continue to monitor and assess.   Problem: Pain Managment: Goal: General experience of comfort will improve Outcome: Progressing Pt has c/o 4/10 left sided abdominal pain r/t her shingles describing it as a constant burning pain.  Reiterated pain scale so she could adequately rate her pain.  Pt stated her pain goal this hospitalization would be 0/10.  Discussed nonpharmacological methods to help reduce s/sx of pain.  Interventions given per pt's request and MD's orders.  Will continue to monitor and assess.   Problem: Safety: Goal: Ability to remain free from injury will improve Outcome: Progressing Instructed pt to utilize RN call light for assistance.  Hourly rounds performed.  Pt has her yellow nonskid socks on when OOB.  Bed alarm implemented to keep pt safe from falls.  Settings set to third most sensitive mode.  Bed in lowest position, locked with two upper side rails engaged.  Belongings and call light within reach.    Problem: Skin Integrity: Goal: Risk for impaired skin integrity will decrease Outcome: Progressing Skin impairment monitored and assessed q-shift/ PRN.  Instructed pt to turn q2 hours to prevent further skin impairment.  Tubes and drains assessed for device related pressure sores.  Pt has a shingles rash and scab to her left abdomenn radiating to  her left back.  MD prescribed a cream to apply to the site.  Will continue to monitor and assess.

## 2019-03-02 NOTE — Discharge Summary (Signed)
[facility] NAME: Jordan Montoya    MR#:  [id number]  DATE OF BIRTH:  24-Aug-1933  DATE OF ADMISSION:  02/28/2019   ADMITTING PHYSICIAN: Harrie Foreman, MD  DATE OF DISCHARGE:  03/02/19  PRIMARY CARE PHYSICIAN: Glean Hess, MD   ADMISSION DIAGNOSIS:   Sepsis ([place]) [A41.9] Community acquired pneumonia, unspecified laterality [J18.9] Sepsis without acute organ dysfunction, due to unspecified organism ([place]) [A41.9]  DISCHARGE DIAGNOSIS:   Active Problems:   Sepsis ([place])   SECONDARY DIAGNOSIS:   Past Medical History:  Diagnosis Date  . Allergy   . Anxiety   . Avitaminosis D 11/25/2014  . Environmental and seasonal allergies 05/30/2015  . HLD (hyperlipidemia) 08/20/2011   Overview:  LDL goal <100 given DM2   . Major depressive disorder with single episode, in partial remission ([place]) 11/25/2014  . Psoriasis 11/25/2014   followed by Dermatology   . Type 2 diabetes mellitus with hyperosmolarity without coma, without long-term current use of insulin ([place]) 09/30/2015    HOSPITAL COURSE:   85 year old female with past medical history significant for major depressive disorder, history of shingles along the left flank to anterior abdominal wall- treated, diabetes presents to hospital secondary to fall and weakness  1.  Sepsis-patient admitted with sepsis, secondary to community-acquired pneumonia. -Chest x-ray with possible right middle lobe infiltrate, concern for viral infection as well -COVID-19 is negative.  on Rocephin and azithromycin-changed to oral cefuroxime at discharge -No further fevers, WBC is normalized -Not on any oxygen at this time.  Lactic acid is within normal limits -Recovered well  2.  Fall-CT of the head and C-spine without any acute findings. -Physical therapy consulted -Patient complains of increased numbness and heaviness in both legs over the last 2 months.   -Lyrica is a new medication that  was started for postherpetic neuralgia a few months ago.  Dose has been decreased to 200 mg twice daily.  Continue to follow-up as outpatient. -CT of the lumbar spine showing significant degenerative arthritis.  Might benefit from physical therapy and epidural injections if needed as outpatient. -Worked with PT and recommended home health.   3.  Postherpetic neuralgia-patient already on Lyrica at home.  Dose has been decreased given lower extremity symptoms. -Topical ointment prescribed by her dermatologist clobetasol has been continued.  Patient states capsaicin cream causes burning feeling on her skin.  4.  Diabetes mellitus-on Tradjenta and low-dose glimepiride  Up and ambulatory.  Will be discharged home today   DISCHARGE CONDITIONS:   Guarded  CONSULTS OBTAINED:   None  DRUG ALLERGIES:   Allergies  Allergen Reactions  . Metformin Nausea Only   DISCHARGE MEDICATIONS:   Allergies as of 03/02/2019      Reactions   Metformin Nausea Only      Medication List    STOP taking these medications   HYDROmorphone 2 MG tablet Commonly known as: Dilaudid   PARoxetine 30 MG tablet Commonly known as: PAXIL     TAKE these medications   acetaminophen 650 MG CR tablet Commonly known as: TYLENOL Take 650 mg by mouth every 8 (eight) hours as needed for pain.   ALPRAZolam 0.25 MG tablet Commonly known as: XANAX Take 1 tablet (0.25 mg total) by mouth daily as needed for anxiety.   cefpodoxime 200 MG tablet Commonly known as: VANTIN Take 1 tablet (200 mg total) by mouth 2 (two) times daily for 4 days.   conjugated estrogens vaginal cream  Commonly known as: Premarin Place 0.25 Applicatorfuls vaginally 3 (three) times a week. Notes to patient: resume   Cosentyx Sensoready (300 MG) 150 MG/ML Soaj Generic drug: Secukinumab (300 MG Dose) Inject 600 mg into the skin every 30 (thirty) days. Notes to patient: resume   ferrous sulfate 325 (65 FE) MG tablet Take 325 mg by  mouth as directed. Notes to patient: resume   Garlic 1000 MG Caps Take 1 capsule by mouth daily. Notes to patient: resume   glimepiride 1 MG tablet Commonly known as: AMARYL Take 1 tablet (1 mg total) by mouth daily with breakfast.   Januvia 100 MG tablet Generic drug: sitaGLIPtin TAKE 1 TABLET (100 MG TOTAL) BY MOUTH DAILY. Notes to patient: resume   meclizine 12.5 MG tablet Commonly known as: ANTIVERT Take 12.5 mg by mouth 3 (three) times daily as needed for dizziness.   meloxicam 7.5 MG tablet Commonly known as: MOBIC Take 1 tablet by mouth daily. Take with food   metoprolol succinate 25 MG 24 hr tablet Commonly known as: TOPROL-XL Take 25 mg by mouth daily.   ondansetron 4 MG disintegrating tablet Commonly known as: Zofran ODT Take 1 tablet (4 mg total) by mouth every 8 (eight) hours as needed for nausea or vomiting.   polyethylene glycol 17 g packet Commonly known as: MiraLax Take 17 g by mouth daily. Notes to patient: resume   pregabalin 100 MG capsule Commonly known as: LYRICA Take 1 capsule (100 mg total) by mouth 2 (two) times daily. What changed: See the new instructions. Notes to patient: 03/02/2019    psyllium 58.6 % powder Commonly known as: METAMUCIL Take 1 packet by mouth daily as needed.   Stool Softener 250 MG capsule Generic drug: docusate sodium Take 250 mg by mouth daily. Notes to patient: resume   traMADol-acetaminophen 37.5-325 MG tablet Commonly known as: ULTRACET Take 1 tablet by mouth every 6 (six) hours as needed for moderate pain.   triamcinolone 0.1 % cream : eucerin Crea Apply 1 application topically daily. Notes to patient: resume            Durable Medical Equipment  (From admission, onward)         Start     Ordered   03/02/19 1036  For home use only DME 3 n 1  Once     03/02/19 1036   03/02/19 0924  For home use only DME Walker rolling  Once    Question:  Patient needs a walker to treat with the following  condition  Answer:  Ataxia   03/02/19 0924           DISCHARGE INSTRUCTIONS:   1. PCP f/u in 1-2 weeks 2. Dermatology f/u in 2 weeks  DIET:   Cardiac diet  ACTIVITY:   Activity as tolerated  OXYGEN:   Home Oxygen: No.  Oxygen Delivery: room air  DISCHARGE LOCATION:   home   If you experience worsening of your admission symptoms, develop shortness of breath, life threatening emergency, suicidal or homicidal thoughts you must seek medical attention immediately by calling 911 or calling your MD immediately  if symptoms less severe.  You Must read complete instructions/literature along with all the possible adverse reactions/side effects for all the Medicines you take and that have been prescribed to you. Take any new Medicines after you have completely understood and accpet all the possible adverse reactions/side effects.   Please note  You were cared for by a hospitalist during your hospital stay.  If you have any questions about your discharge medications or the care you received while you were in the hospital after you are discharged, you can call the unit and asked to speak with the hospitalist on call if the hospitalist that took care of you is not available. Once you are discharged, your primary care physician will handle any further medical issues. Please note that NO REFILLS for any discharge medications will be authorized once you are discharged, as it is imperative that you return to your primary care physician (or establish a relationship with a primary care physician if you do not have one) for your aftercare needs so that they can reassess your need for medications and monitor your lab values.    On the day of Discharge:  VITAL SIGNS:   Blood pressure (!) 133/50, pulse 72, temperature 97.9 F (36.6 C), resp. rate 16, height 5\' 6"  (1.676 m), weight 69.9 kg, SpO2 98 %.  PHYSICAL EXAMINATION:   GENERAL:  85 y.o.-year-old elderly patient lying in the bed with no  acute distress.  EYES: Pupils equal, round, reactive to light and accommodation. No scleral icterus. Extraocular muscles intact.  HEENT: Head atraumatic, normocephalic. Oropharynx and nasopharynx clear.  NECK:  Supple, no jugular venous distention. No thyroid enlargement, no tenderness.  LUNGS: Normal breath sounds bilaterally, no wheezing, rales,rhonchi or crepitation. No use of accessory muscles of respiration.  Decreased bibasilar breath sounds CARDIOVASCULAR: S1, S2 normal. No   rubs, or gallops.  2/6 systolic murmur present ABDOMEN: Soft, nontender, nondistended. Bowel sounds present. No organomegaly or mass.  EXTREMITIES: No pedal edema, cyanosis, or clubbing.  NEUROLOGIC: Cranial nerves II through XII are intact. Muscle strength equal in both upper extremities, and in both lower extremities with hyporeflexia.  Sensation intact. Gait not checked.  Generalized weakness PSYCHIATRIC: The patient is alert and oriented x 3.  SKIN: No obvious rash, lesion, or ulcer.   DATA REVIEW:   CBC Recent Labs  Lab 03/01/19 0502  WBC 10.0  HGB 10.2*  HCT 31.6*  PLT 190    Chemistries  Recent Labs  Lab 02/28/19 0138  NA 137  K 3.6  CL 103  CO2 24  GLUCOSE 155*  BUN 29*  CREATININE 0.72  CALCIUM 9.2  AST 21  ALT 18  ALKPHOS 61  BILITOT 0.9     Microbiology Results  Results for orders placed or performed during the hospital encounter of 02/28/19  Culture, blood (Routine x 2)     Status: None (Preliminary result)   Collection Time: 02/28/19  1:38 AM   Specimen: BLOOD  Result Value Ref Range Status   Specimen Description BLOOD LEFT ASSIST CONTROL  Final   Special Requests   Final    BOTTLES DRAWN AEROBIC AND ANAEROBIC Blood Culture results may not be optimal due to an excessive volume of blood received in culture bottles   Culture   Final    NO GROWTH 2 DAYS Performed at [facility], [address] [place], [place] [address]    Report Status PENDING  Incomplete   Culture, blood (Routine x 2)     Status: None (Preliminary result)   Collection Time: 02/28/19  1:38 AM   Specimen: BLOOD  Result Value Ref Range Status   Specimen Description BLOOD RIGHT ASSIST CONTROL  Final   Special Requests   Final    BOTTLES DRAWN AEROBIC AND ANAEROBIC Blood Culture results may not be optimal due to an excessive volume of blood received in culture  bottles   Culture   Final    NO GROWTH 2 DAYS Performed at [facility], [address] [place], [place] [address]    Report Status PENDING  Incomplete  SARS Coronavirus 2 (CEPHEID- Performed in [facility] hospital lab), Hosp Order     Status: None   Collection Time: 02/28/19  1:50 AM   Specimen: Nasopharyngeal Swab  Result Value Ref Range Status   SARS Coronavirus 2 NEGATIVE NEGATIVE Final    Comment: (NOTE) If result is NEGATIVE SARS-CoV-2 target nucleic acids are NOT DETECTED. The SARS-CoV-2 RNA is generally detectable in upper and lower  respiratory specimens during the acute phase of infection. The lowest  concentration of SARS-CoV-2 viral copies this assay can detect is 250  copies / mL. A negative result does not preclude SARS-CoV-2 infection  and should not be used as the sole basis for treatment or other  patient management decisions.  A negative result may occur with  improper specimen collection / handling, submission of specimen other  than nasopharyngeal swab, presence of viral mutation(s) within the  areas targeted by this assay, and inadequate number of viral copies  (<250 copies / mL). A negative result must be combined with clinical  observations, patient history, and epidemiological information. If result is POSITIVE SARS-CoV-2 target nucleic acids are DETECTED. The SARS-CoV-2 RNA is generally detectable in upper and lower  respiratory specimens dur ing the acute phase of infection.  Positive  results are indicative of active infection with SARS-CoV-2.  Clinical  correlation with  patient history and other diagnostic information is  necessary to determine patient infection status.  Positive results do  not rule out bacterial infection or co-infection with other viruses. If result is PRESUMPTIVE POSTIVE SARS-CoV-2 nucleic acids MAY BE PRESENT.   A presumptive positive result was obtained on the submitted specimen  and confirmed on repeat testing.  While 2019 novel coronavirus  (SARS-CoV-2) nucleic acids may be present in the submitted sample  additional confirmatory testing may be necessary for epidemiological  and / or clinical management purposes  to differentiate between  SARS-CoV-2 and other Sarbecovirus currently known to infect humans.  If clinically indicated additional testing with an alternate test  methodology [phone]([id number]) is advised. The SARS-CoV-2 RNA is generally  detectable in upper and lower respiratory sp ecimens during the acute  phase of infection. The expected result is Negative. Fact Sheet for Patients:  BoilerBrush.com.cyhttps://www.fda.gov/media/136312/download Fact Sheet for Healthcare Providers: https://pope.com/https://www.fda.gov/media/136313/download This test is not yet approved or cleared by the [place] FDA and has been authorized for detection and/or diagnosis of SARS-CoV-2 by FDA under an Emergency Use Authorization (EUA).  This EUA will remain in effect (meaning this test can be used) for the duration of the COVID-19 declaration under Section 564(b)(1) of the Act, 21 U.S.C. section 360bbb-3(b)(1), unless the authorization is terminated or revoked sooner. Performed at [facility], [address] [place], [place] [address]   Urine culture     Status: None   Collection Time: 02/28/19  1:50 AM   Specimen: Urine, Random  Result Value Ref Range Status   Specimen Description   Final    URINE, RANDOM Performed at [facility], [address] [place], [place] [address]    Special Requests   Final    NONE Performed at [facility],  [address] [place], [place] [address]    Culture   Final    NO GROWTH Performed at [facility], [address] [place], [place] [address]    Report Status 03/01/2019 FINAL  Final    RADIOLOGY:  Ct Lumbar Spine Wo Contrast  Result Date: 03/01/2019 CLINICAL DATA:  Low back pain.  Recent falls. EXAM: CT LUMBAR SPINE WITHOUT CONTRAST TECHNIQUE: Multidetector CT imaging of the lumbar spine was performed without intravenous contrast administration. Multiplanar CT image reconstructions were also generated. COMPARISON:  None. FINDINGS: Segmentation: Normal Alignment: Normal Vertebrae: Negative for fracture or mass. Paraspinal and other soft tissues: Negative for paraspinous mass or adenopathy. Large calcified gallstones. Mild atherosclerotic aorta. Disc levels: L1-2: Mild facet degeneration.  Negative for stenosis L2-3: Mild disc bulging and mild facet degeneration. Negative for stenosis L3-4: Mild disc bulging and moderate facet degeneration. Mild spinal stenosis. L4-5: Disc degeneration with gas in the disc space. Diffuse disc bulging with endplate spurring. Advanced facet arthrosis bilaterally causing moderate spinal stenosis. Moderate subarticular stenosis bilaterally. L5-S1: Severe facet degeneration with bony overgrowth left causing marked subarticular stenosis on the left. Expected impingement left S1 nerve root. Moderate right-sided facet arthrosis with mild to moderate right subarticular stenosis. IMPRESSION: Mild spinal stenosis L3-4 Moderate spinal stenosis L4-5 with moderate subarticular stenosis bilaterally Severe facet degeneration on the left with severe left subarticular stenosis. Mild to moderate subarticular stenosis on the right. Large calcified gallstone Electronically Signed   By: Marlan Palauharles  Clark M.D.   On: 03/01/2019 14:37     Management plans discussed with the patient, family and they are in agreement.  CODE STATUS:     Code Status Orders  (From admission, onward)          Start     Ordered   02/28/19 0411  Full code  Continuous     02/28/19 0410        Code Status History    This patient has a current code status but no historical code status.   Advance Care Planning Activity      TOTAL TIME TAKING CARE OF THIS PATIENT: 38 minutes.    Enid Baasadhika Rondalyn Belford M.D on 03/02/2019 at 1:49 PM  Between 7am to 6pm - Pager - [phone]  After 6pm go to [email or web address] - Social research officer, governmentpassword [email or web address] [email or web address]  [employer] [facility]  Office  [phone]  CC: Primary care physician; Reubin MilanBerglund, Laura H, MD   Note: This dictation was prepared with Dragon dictation along with smaller phrase technology. Any transcriptional errors that result from this process are unintentional.

## 2019-03-02 NOTE — Progress Notes (Signed)
Discharge order received. Patient mental status is at baseline. Vital signs stable . No signs of acute distress. Discharge instructions given. Patient verbalized understanding. No other issues noted at this time.   

## 2019-03-02 NOTE — TOC Progression Note (Addendum)
Transition of Care [facility]) - Progression Note    Patient Details  Name: Jordan Montoya MRN: [id number] Date of Birth: 27-Dec-1933  Transition of [facility]) CM/SW [place], LCSW Phone Number: 03/02/2019, 9:01 AM  Clinical Narrative: Patient has been accepted by [facility] Their representative will call her and recommend a few personal care service companies.    9:51 am: Ordered rolling walker and 3-in-1 from [employer]. Rolling walker order is in. Asked MD for 3-in-1 order. Notified [facility] that home health orders are in.  Expected Discharge Plan: [place] Barriers to Discharge: Continued Medical Work up  Expected Discharge Plan and Services Expected Discharge Plan: [place] Choice: Durable Medical Equipment, Home Health Living arrangements for the past 2 months: Single Family Home([facility])                                       Social Determinants of Health (SDOH) Interventions    Readmission Risk Interventions No flowsheet data found.

## 2019-03-05 ENCOUNTER — Telehealth: Payer: Self-pay

## 2019-03-05 LAB — CULTURE, BLOOD (ROUTINE X 2)
Culture: NO GROWTH
Culture: NO GROWTH

## 2019-03-05 NOTE — Telephone Encounter (Signed)
Transition Care Management Follow-up Telephone Call  Date of discharge and from where: 03/02/19 [facility]   How have you been since you were released from the hospital? Pt states she is having difficulty walking and has a caregiver with her  Any questions or concerns? Yes  - pt has not heard from [facility] for home health nursing and physical therapy. Pt provided with information to contact them for status on home health services.   Items Reviewed:  Did the pt receive and understand the discharge instructions provided? Yes   Medications obtained and verified? Yes   Any new allergies since your discharge? No   Dietary orders reviewed? Yes  Do you have support at home? Yes   Functional Questionnaire: (I = Independent and D = Dependent) ADLs: I with assistance  Bathing/Dressing- D  Meal Prep- D  Eating- I  Maintaining continence- I  Transferring/Ambulation- I with assistance  Managing Meds- I  Follow up appointments reviewed:   PCP Hospital f/u appt confirmed? Yes  Scheduled to see Dr. Army Melia on 03/16/19 @ 1:20.  Are transportation arrangements needed? No   If their condition worsens, is the pt aware to call PCP or go to the Emergency Dept.? Yes  Was the patient provided with contact information for the PCP's office or ED? Yes  Was to pt encouraged to call back with questions or concerns? Yes

## 2019-03-07 ENCOUNTER — Telehealth: Payer: Self-pay

## 2019-03-07 NOTE — Telephone Encounter (Signed)
Jordan Montoya from [place] at [facility] called requesting verbal orders for skilled nursing for pt. Wants to start seeing pt Friday.   Verbal orders given on her VM.  CB# [phone]

## 2019-03-09 ENCOUNTER — Telehealth: Payer: Self-pay

## 2019-03-09 NOTE — Telephone Encounter (Signed)
Amanda from [place] at [facility] called saying she needed verbal orders to see pt for once weekly for 3 weeks.  Gave verbal over the phone.   CB# [phone]

## 2019-03-13 ENCOUNTER — Telehealth: Payer: Self-pay

## 2019-03-13 NOTE — Telephone Encounter (Signed)
Griselda Miner called from [place] at [facility] for patient. Just to inform us she will be the patients Education officer, museum.

## 2019-03-14 ENCOUNTER — Other Ambulatory Visit: Payer: Self-pay | Admitting: Internal Medicine

## 2019-03-16 ENCOUNTER — Other Ambulatory Visit: Payer: Self-pay

## 2019-03-16 ENCOUNTER — Ambulatory Visit: Payer: Medicare Other | Admitting: Internal Medicine

## 2019-03-16 ENCOUNTER — Encounter: Payer: Self-pay | Admitting: Internal Medicine

## 2019-03-16 VITALS — BP 138/74 | HR 61 | Ht 69.0 in

## 2019-03-16 DIAGNOSIS — J181 Lobar pneumonia, unspecified organism: Secondary | ICD-10-CM | POA: Diagnosis not present

## 2019-03-16 DIAGNOSIS — M4800 Spinal stenosis, site unspecified: Secondary | ICD-10-CM | POA: Insufficient documentation

## 2019-03-16 DIAGNOSIS — J189 Pneumonia, unspecified organism: Secondary | ICD-10-CM

## 2019-03-16 DIAGNOSIS — M48061 Spinal stenosis, lumbar region without neurogenic claudication: Secondary | ICD-10-CM | POA: Diagnosis not present

## 2019-03-16 DIAGNOSIS — F411 Generalized anxiety disorder: Secondary | ICD-10-CM

## 2019-03-16 DIAGNOSIS — B029 Zoster without complications: Secondary | ICD-10-CM

## 2019-03-16 MED ORDER — TRAMADOL-ACETAMINOPHEN 37.5-325 MG PO TABS: 1.0000 | ORAL_TABLET | Freq: Four times a day (QID) | ORAL | 0 refills | Status: DC | PRN

## 2019-03-16 NOTE — Progress Notes (Signed)
Date:  03/16/2019   Name:  Jordan SpittleGretchen Johns Montoya   DOB:  01-28-1934   MRN:  [id number]   Chief Complaint: Hospitalization Follow-up (Sepsis. Still having shingles pain. ) Hospital follow up from [facility],  Admitted 02/28/19 to 03/02/19 for CAP and sepsis without organ dysfunction after a fall at home.  Hospital course: 1. Sepsis-patient admitted with sepsis, secondary to community-acquired pneumonia. -Chest x-ray with possible right middle lobe infiltrate, concern for viral infection as well -COVID-19 is negative. on Rocephin and azithromycin-changed to oral cefuroxime at discharge -No further fevers, WBC is normalized -Not on any oxygen at this time. Lactic acid is within normal limits -Recovered well Today - pt states that she finished the antibiotics without any side effects.  She never felt SOB or had sputum production.  She will need follow CXR to document clearing.   2. Fall-CT of the head and C-spine without any acute findings. -Physical therapy consulted -Patient complains of increased numbness and heaviness in both legs over the last 2 months.  -Lyrica is a new medication that was started for postherpetic neuralgia a few months ago.  Dose has been decreased to 200 mg twice daily.  Continue to follow-up as outpatient. -CT of the lumbar spine showing significant degenerative arthritis.  Might benefit from physical therapy and epidural injections if needed as outpatient. -Worked with PT and recommended home health.  Today - started on Ultram which she takes 1/2 tablet at bedtime so she can sleep.  She just started working with PT, getting around at home with a walker - she is embarrassed to have to use a WC.  Discussed the finding of spinal stenosis and perhaps she might benefit from Ortho or Neurosurgery evaluation in the near future.  3. Postherpetic neuralgia-patient already on Lyrica at home. Dose has been decreased given lower extremity symptoms. -Topical ointment prescribed  by her dermatologist clobetasol has been continued.  Patient states capsaicin cream causes burning feeling on her skin. Today - the rash is finally clearing.  She has 2 shallow ulcer on her left buttock that she is treated with topical antibiotics.  She still has significant pain and got tremendous relief from Lyrica.  The dose was decreased to 200 mg bid at the hospital but she sometimes needs to take a third dose when the pain spikes. 4. Diabetes mellitus-on Tradjenta and low-dose glimepiride  Lab Results  Component Value Date   WBC 10.0 03/01/2019   HGB 10.2 (L) 03/01/2019   HCT 31.6 (L) 03/01/2019   MCV 98.8 03/01/2019   PLT 190 03/01/2019   Lab Results  Component Value Date   CREATININE 0.72 02/28/2019   BUN 29 (H) 02/28/2019   NA 137 02/28/2019   K 3.6 02/28/2019   CL 103 02/28/2019   CO2 24 02/28/2019    Anxiety Presents for follow-up visit. Symptoms include excessive worry, insomnia and nervous/anxious behavior. Patient reports no chest pain, dizziness, palpitations, shortness of breath or suicidal ideas. Symptoms occur most days. The quality of sleep is poor.   Compliance with medications: xanax qhs.    Review of Systems  Constitutional: Positive for fatigue. Negative for chills, fever and unexpected weight change.  Respiratory: Negative for cough and shortness of breath.   Cardiovascular: Negative for chest pain, palpitations and leg swelling.  Genitourinary: Negative for difficulty urinating and dysuria.  Musculoskeletal: Positive for arthralgias and gait problem.  Skin: Positive for rash.  Neurological: Negative for dizziness, light-headedness and headaches.  Psychiatric/Behavioral: Positive for sleep disturbance.  Negative for dysphoric mood and suicidal ideas. The patient is nervous/anxious and has insomnia.     Patient Active Problem List   Diagnosis Date Noted  . Spinal stenosis of lumbar region at multiple levels 03/16/2019  . Sepsis ([place]) 02/28/2019  .  Anxiety disorder 11/16/2018  . Primary osteoarthritis of both hands 08/16/2018  . Arthralgia of multiple sites 05/18/2018  . Pernicious anemia 12/29/2017  . 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 Environmental and seasonal allergies 05/30/2015  . Hip pain, chronic 01/27/2015  . Major depressive disorder with single episode, in partial remission ([place]) 11/25/2014  . Avitaminosis D 11/25/2014  . Psoriasis 11/25/2014  . Hyperlipidemia associated with type 2 diabetes mellitus ([place]) 08/20/2011    Allergies  Allergen Reactions  . Metformin Nausea Only    Past Surgical History:  Procedure Laterality Date  . BREAST BIOPSY Left    neg-bx/clip  . BREAST CYST EXCISION Left    neg  . EYE SURGERY Bilateral    cataracts  . PARTIAL HYSTERECTOMY     one ovary remains    Social History   Tobacco Use  . Smoking status: Former Smoker    Packs/day: 0.25    Years: 20.00    Pack years: 5.00    Types: Cigarettes  . Smokeless tobacco: Never Used  Substance Use Topics  . Alcohol use: No    Alcohol/week: 0.0 standard drinks  . Drug use: No     Medication list has been reviewed and updated.  Current Meds  Medication Sig  . acetaminophen (TYLENOL) 650 MG CR tablet Take 650 mg by mouth every 8 (eight) hours as needed for pain.   Marland Kitchen ALPRAZolam (XANAX) 0.25 MG tablet Take 1 tablet (0.25 mg total) by mouth daily as needed for anxiety.  . conjugated estrogens (PREMARIN) vaginal cream Place 7.82 Applicatorfuls vaginally 3 (three) times a week.  Hillary Bow SENSOREADY 300 DOSE 150 MG/ML SOAJ Inject 600 mg into the skin every 30 (thirty) days.  Marland Kitchen docusate sodium (STOOL SOFTENER) 250 MG capsule Take 250 mg by mouth daily.  . ferrous sulfate 325 (65 FE) MG tablet Take 325 mg by mouth as directed.   . Garlic 9562 MG CAPS Take 1 capsule by mouth daily.   Marland Kitchen glimepiride (AMARYL) 1 MG tablet Take 1 tablet (1 mg total) by mouth daily with breakfast.  . JANUVIA 100 MG tablet  TAKE 1 TABLET (100 MG TOTAL) BY MOUTH DAILY.  . meclizine (ANTIVERT) 12.5 MG tablet Take 12.5 mg by mouth 3 (three) times daily as needed for dizziness.  . meloxicam (MOBIC) 7.5 MG tablet Take 1 tablet by mouth daily. Take with food  . metoprolol succinate (TOPROL-XL) 25 MG 24 hr tablet Take 25 mg by mouth daily.   . ondansetron (ZOFRAN ODT) 4 MG disintegrating tablet Take 1 tablet (4 mg total) by mouth every 8 (eight) hours as needed for nausea or vomiting.  . polyethylene glycol (MIRALAX) 17 g packet Take 17 g by mouth daily.  . pregabalin (LYRICA) 100 MG capsule Take 1 capsule (100 mg total) by mouth 2 (two) times daily.  . psyllium (METAMUCIL) 58.6 % powder Take 1 packet by mouth daily as needed.  . traMADol-acetaminophen (ULTRACET) 37.5-325 MG tablet Take 1 tablet by mouth every 6 (six) hours as needed for moderate pain.  . Triamcinolone Acetonide (TRIAMCINOLONE 0.1 % CREAM : EUCERIN) CREA Apply 1 application topically daily.     PHQ 2/9 Scores 03/16/2019 12/05/2018 11/16/2018 10/16/2018  PHQ - 2 Score 5 3 1  0  PHQ- 9 Score 7 12 6 6     BP Readings from Last 3 Encounters:  03/16/19 138/74  03/02/19 (!) 133/50  02/01/19 (!) 162/65    Physical Exam Constitutional:      Appearance: Normal appearance.  Cardiovascular:     Rate and Rhythm: Normal rate and regular rhythm.     Pulses: Normal pulses.     Heart sounds: No murmur.  Pulmonary:     Effort: Pulmonary effort is normal.     Breath sounds: Normal breath sounds. No wheezing, rhonchi or rales.  Musculoskeletal:     Right lower leg: No edema.     Left lower leg: No edema.  Skin:      Neurological:     Mental Status: She is alert.     Cranial Nerves: Cranial nerves are intact.     Motor: Motor function is intact.     Wt Readings from Last 3 Encounters:  03/02/19 154 lb 1.6 oz (69.9 kg)  02/01/19 148 lb (67.1 kg)  01/12/19 155 lb (70.3 kg)    BP 138/74   Pulse 61   Ht 5\' 9"  (1.753 m)   SpO2 97%   BMI 22.76 kg/m    Assessment and Plan: 1. Community acquired pneumonia of right middle lobe of lung (HCC) Clinically resolved with normal exam Will get CXR in one month to document resolution  2. Spinal stenosis of lumbar region at multiple levels May be the cause of her leg pain and weakness Continue PT with HH and will discuss possible referral at next appointment Continue Tramadol at HS; take tylenol during the day - traMADol-acetaminophen (ULTRACET) 37.5-325 MG tablet; Take 1 tablet by mouth every 6 (six) hours as needed for moderate pain.  Dispense: 30 tablet; Refill: 0  3. Generalized anxiety disorder Chronic but worse since [place] Continue Xanax daily as needed  4. Herpes zoster without complication Severe episode of zoster is finally resolving but with persistent PHN that is responding to Lyrica; continue lyrica bid or tid as needed Continue topical ointment to skin ulcers - traMADol-acetaminophen (ULTRACET) 37.5-325 MG tablet; Take 1 tablet by mouth every 6 (six) hours as needed for moderate pain.  Dispense: 30 tablet; Refill: 0   Partially dictated using Animal nutritionistDragon software. Any errors are unintentional.  Bari EdwardLaura Cattaleya Wien, MD [facility]  03/16/2019

## 2019-03-20 ENCOUNTER — Other Ambulatory Visit: Payer: Self-pay | Admitting: Internal Medicine

## 2019-03-20 ENCOUNTER — Encounter: Payer: Self-pay | Admitting: Internal Medicine

## 2019-03-20 DIAGNOSIS — B029 Zoster without complications: Secondary | ICD-10-CM

## 2019-03-20 DIAGNOSIS — R262 Difficulty in walking, not elsewhere classified: Secondary | ICD-10-CM

## 2019-03-20 DIAGNOSIS — F411 Generalized anxiety disorder: Secondary | ICD-10-CM

## 2019-03-20 DIAGNOSIS — A419 Sepsis, unspecified organism: Secondary | ICD-10-CM

## 2019-03-20 DIAGNOSIS — F324 Major depressive disorder, single episode, in partial remission: Secondary | ICD-10-CM

## 2019-03-20 DIAGNOSIS — L409 Psoriasis, unspecified: Secondary | ICD-10-CM

## 2019-03-20 DIAGNOSIS — E113299 Type 2 diabetes mellitus with mild nonproliferative diabetic retinopathy without macular edema, unspecified eye: Secondary | ICD-10-CM

## 2019-03-20 DIAGNOSIS — Z9181 History of falling: Secondary | ICD-10-CM

## 2019-03-20 DIAGNOSIS — M48061 Spinal stenosis, lumbar region without neurogenic claudication: Secondary | ICD-10-CM

## 2019-03-20 DIAGNOSIS — J189 Pneumonia, unspecified organism: Secondary | ICD-10-CM

## 2019-03-20 DIAGNOSIS — B0229 Other postherpetic nervous system involvement: Secondary | ICD-10-CM | POA: Insufficient documentation

## 2019-03-20 DIAGNOSIS — E1169 Type 2 diabetes mellitus with other specified complication: Secondary | ICD-10-CM

## 2019-03-20 NOTE — Progress Notes (Signed)
Received orders from [facility]. Order start of care 03/09/2019 through 05/07/2019. Orders are reviewed, signed and faxed.

## 2019-03-26 ENCOUNTER — Telehealth: Payer: Self-pay

## 2019-03-26 NOTE — Telephone Encounter (Signed)
She can go back to three per day as needed - just take the extra at midday when needed.

## 2019-03-26 NOTE — Telephone Encounter (Signed)
Called and spoke with pt informing of Dr Gaspar Cola note.

## 2019-03-26 NOTE — Telephone Encounter (Signed)
Patient called saying she is unable to do her PT today. Having groin/shingles pain again. Wants to know if she can go back to taking Lyrica three times daily. Has been taking twice daily.  Please Advise?

## 2019-04-04 ENCOUNTER — Other Ambulatory Visit: Payer: Self-pay | Admitting: Internal Medicine

## 2019-04-04 DIAGNOSIS — E1100 Type 2 diabetes mellitus with hyperosmolarity without nonketotic hyperglycemic-hyperosmolar coma (NKHHC): Secondary | ICD-10-CM

## 2019-04-10 ENCOUNTER — Telehealth: Payer: Self-pay

## 2019-04-10 NOTE — Telephone Encounter (Signed)
Patient called just to ask if its okay to take Lyrica 50 mg daily instead of 100 mg daily because her legs were feeling numb. She made this change 2 days ago and her legs are now back to normal.  Called and informed patient she can take 50 mg daily- this is fine per Dr. Army Melia.   Also, having to take 1/2 in the morning and 1/2 in the PM for Xanax. Dr. Army Melia said this was okay. Patient will call when needing RF.

## 2019-04-11 ENCOUNTER — Other Ambulatory Visit: Payer: Self-pay | Admitting: Internal Medicine

## 2019-04-11 ENCOUNTER — Telehealth: Payer: Self-pay

## 2019-04-11 DIAGNOSIS — F411 Generalized anxiety disorder: Secondary | ICD-10-CM

## 2019-04-11 NOTE — Telephone Encounter (Signed)
Patient called and left a VM. It sounded like she was crying and very upset. She wants to request a RF for her Xanax. She cut back on her lyrica last night to 50 mg because she said she could not feel her feet sometimes. She ended up having to take the full 100 mg because she said her anxiety became worse and then her nerve pain kicked in.  Please advise RF request.

## 2019-04-13 ENCOUNTER — Ambulatory Visit
Admission: EM | Admit: 2019-04-13 | Discharge: 2019-04-13 | Disposition: A | Discharge status: Other Acute Inpatient Hospital | Payer: Medicare Other | Source: Home / Self Care

## 2019-04-13 ENCOUNTER — Encounter: Payer: Self-pay | Admitting: Intensive Care

## 2019-04-13 ENCOUNTER — Other Ambulatory Visit: Payer: Self-pay

## 2019-04-13 ENCOUNTER — Encounter: Payer: Self-pay | Admitting: Emergency Medicine

## 2019-04-13 ENCOUNTER — Emergency Department
Admission: EM | Admit: 2019-04-13 | Discharge: 2019-04-13 | Disposition: A | Discharge status: Home / Self Care | Payer: Medicare Other | Attending: Emergency Medicine | Admitting: Emergency Medicine

## 2019-04-13 DIAGNOSIS — E119 Type 2 diabetes mellitus without complications: Secondary | ICD-10-CM | POA: Diagnosis not present

## 2019-04-13 DIAGNOSIS — M79605 Pain in left leg: Secondary | ICD-10-CM | POA: Diagnosis present

## 2019-04-13 DIAGNOSIS — Z79899 Other long term (current) drug therapy: Secondary | ICD-10-CM | POA: Diagnosis not present

## 2019-04-13 DIAGNOSIS — B0229 Other postherpetic nervous system involvement: Secondary | ICD-10-CM | POA: Diagnosis not present

## 2019-04-13 DIAGNOSIS — R52 Pain, unspecified: Secondary | ICD-10-CM

## 2019-04-13 DIAGNOSIS — M4800 Spinal stenosis, site unspecified: Secondary | ICD-10-CM

## 2019-04-13 DIAGNOSIS — Z87891 Personal history of nicotine dependence: Secondary | ICD-10-CM | POA: Diagnosis not present

## 2019-04-13 DIAGNOSIS — Z7984 Long term (current) use of oral hypoglycemic drugs: Secondary | ICD-10-CM | POA: Insufficient documentation

## 2019-04-13 DIAGNOSIS — M48061 Spinal stenosis, lumbar region without neurogenic claudication: Secondary | ICD-10-CM | POA: Diagnosis not present

## 2019-04-13 LAB — CBC WITH DIFFERENTIAL/PLATELET
Abs Immature Granulocytes: 0.02 10*3/uL (ref 0.00–0.07)
Basophils Absolute: 0 10*3/uL (ref 0.0–0.1)
Basophils Relative: 0 %
Eosinophils Absolute: 0.1 10*3/uL (ref 0.0–0.5)
Eosinophils Relative: 1 %
HCT: 39.2 % (ref 36.0–46.0)
Hemoglobin: 13 g/dL (ref 12.0–15.0)
Immature Granulocytes: 0 %
Lymphocytes Relative: 22 %
Lymphs Abs: 1.6 10*3/uL (ref 0.7–4.0)
MCH: 31.2 pg (ref 26.0–34.0)
MCHC: 33.2 g/dL (ref 30.0–36.0)
MCV: 94 fL (ref 80.0–100.0)
Monocytes Absolute: 0.6 10*3/uL (ref 0.1–1.0)
Monocytes Relative: 9 %
Neutro Abs: 4.8 10*3/uL (ref 1.7–7.7)
Neutrophils Relative %: 68 %
Platelets: 224 10*3/uL (ref 150–400)
RBC: 4.17 MIL/uL (ref 3.87–5.11)
RDW: 12.1 % (ref 11.5–15.5)
WBC: 7.2 10*3/uL (ref 4.0–10.5)
nRBC: 0 % (ref 0.0–0.2)

## 2019-04-13 LAB — COMPREHENSIVE METABOLIC PANEL
ALT: 14 U/L (ref 0–44)
AST: 15 U/L (ref 15–41)
Albumin: 4.1 g/dL (ref 3.5–5.0)
Alkaline Phosphatase: 62 U/L (ref 38–126)
Anion gap: 8 (ref 5–15)
BUN: 21 mg/dL (ref 8–23)
CO2: 26 mmol/L (ref 22–32)
Calcium: 9.2 mg/dL (ref 8.9–10.3)
Chloride: 107 mmol/L (ref 98–111)
Creatinine, Ser: 0.67 mg/dL (ref 0.44–1.00)
GFR calc Af Amer: >60 mL/min (ref >60)
GFR calc non Af Amer: >60 mL/min (ref >60)
Glucose, Bld: 82 mg/dL (ref 70–99)
Potassium: 4 mmol/L (ref 3.5–5.1)
Sodium: 141 mmol/L (ref 135–145)
Total Bilirubin: 0.5 mg/dL (ref 0.3–1.2)
Total Protein: 7.5 g/dL (ref 6.5–8.1)

## 2019-04-13 MED ORDER — HYDROCODONE-ACETAMINOPHEN 5-325 MG PO TABS: 1.0000 | ORAL_TABLET | ORAL | 0 refills | Status: DC | PRN

## 2019-04-13 MED ORDER — MORPHINE SULFATE (PF) 4 MG/ML IV SOLN
4.0000 mg | Freq: Once | INTRAVENOUS | Status: AC
  Administered 2019-04-13: 14:00:00 4 mg via INTRAMUSCULAR
  Filled 2019-04-13: qty 1

## 2019-04-13 NOTE — ED Triage Notes (Addendum)
Patient from urgent care for pain in left leg,groin, and abdomen from possible shingles. Reporets she has had shingles since April. Urgent care sent patient for pain control. Patient has care giver daily from 9a-4pm. Disoriented to time. Oriented to self, situation, and place. Patient stated multiple times "If I have to wait long then I am leaving. The doctor at urgent care said I would go straight to a room"

## 2019-04-13 NOTE — ED Provider Notes (Signed)
[facility] Emergency Department Provider Note  Time seen: 2:40 PM  I have reviewed the triage vital signs and the nursing notes.   HISTORY  Chief Complaint Leg pain  HPI Jordan Montoya is a 85 y.o. female with a past medical history anxiety, hyperlipidemia, depression, diabetes, spinal stenosis, shingles, presents to the emergency department for leg pain.  According to the patient since April she has been dealing with shingles to her left pelvis and lower back.  Patient also states she has been diagnosed with spinal stenosis and over the past 1 to 2 months has had worsening pain shooting down her legs mostly her left leg.  Denies any weakness or numbness or incontinence.  Patient has not followed up with anyone regarding the spinal stenosis.  Patient states she was prescribed oxycodone for her shingles pain but her son took it away from her because he was worried that she could become an addict.   Patient states her doctor gave her Lyrica but that is not helping.  States she could not sleep last night which is what prompted today's ER visit.  Past Medical History:  Diagnosis Date  . Allergy   . Anxiety   . Avitaminosis D 11/25/2014  . Environmental and seasonal allergies 05/30/2015  . HLD (hyperlipidemia) 08/20/2011   Overview:  LDL goal <100 given DM2   . 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 Psoriasis 11/25/2014   followed by Dermatology   . Type 2 diabetes mellitus with hyperosmolarity without coma, without long-term current use of insulin (HCC) 09/30/2015  . Unspecified septicemia(038.9) (HCC) 02/28/2019    Patient Active Problem List   Diagnosis Date Noted  . Herpes zoster 03/20/2019  . Spinal stenosis of lumbar region at multiple levels 03/16/2019  . Anxiety disorder 11/16/2018  . Primary osteoarthritis of both hands 08/16/2018  . Arthralgia of multiple sites 05/18/2018  . Pernicious anemia 12/29/2017   . 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 Environmental and seasonal allergies 05/30/2015  . Hip pain, chronic 01/27/2015  . 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 Avitaminosis D 11/25/2014  . Psoriasis 11/25/2014  . Hyperlipidemia associated with type 2 diabetes mellitus (HCC) 08/20/2011    Past Surgical History:  Procedure Laterality Date  . BREAST BIOPSY Left    neg-bx/clip  . BREAST CYST EXCISION Left    neg  . EYE SURGERY Bilateral    cataracts  . PARTIAL HYSTERECTOMY     one ovary remains    Prior to Admission medications   Medication Sig Start Date End Date Taking? Authorizing Provider  acetaminophen (TYLENOL) 650 MG CR tablet Take 650 mg by mouth every 8 (eight) hours as needed for pain.     [provider]  ALPRAZolam Prudy Feeler(XANAX) 0.25 MG tablet TAKE 1 TABLET BY MOUTH DAILY AS NEEDED FOR ANXIETY 04/11/19   Reubin MilanBerglund, Laura H, MD  conjugated estrogens (PREMARIN) vaginal cream Place 0.25 Applicatorfuls vaginally 3 (three) times a week. 11/17/18   Reubin MilanBerglund, Laura H, MD  COSENTYX SENSOREADY 300 DOSE 150 MG/ML SOAJ Inject 600 mg into the skin every 30 (thirty) days. 01/26/16   [provider]  docusate sodium (STOOL SOFTENER) 250 MG capsule Take 250 mg by mouth daily.    [provider]  ferrous sulfate 325 (65 FE) MG tablet Take 325 mg by mouth as directed.     [provider]  Garlic 1000  MG CAPS Take 1 capsule by mouth daily.     [provider]  glimepiride (AMARYL) 1 MG tablet TAKE 1 TABLET (1 MG TOTAL) BY MOUTH DAILY WITH BREAKFAST. 04/04/19   Glean Hess, MD  JANUVIA 100 MG tablet TAKE 1 TABLET (100 MG TOTAL) BY MOUTH DAILY. 03/14/19   Glean Hess, MD  meclizine (ANTIVERT) 12.5 MG tablet Take 12.5 mg by mouth 3 (three) times daily as needed for dizziness.    [provider]  meloxicam (MOBIC) 7.5 MG tablet Take 1 tablet by mouth daily. Take  with food 08/16/18   [provider]  metoprolol succinate (TOPROL-XL) 25 MG 24 hr tablet Take 25 mg by mouth daily.     [provider]  ondansetron (ZOFRAN ODT) 4 MG disintegrating tablet Take 1 tablet (4 mg total) by mouth every 8 (eight) hours as needed for nausea or vomiting. 12/20/18   Harvest Dark, MD  polyethylene glycol (MIRALAX) 17 g packet Take 17 g by mouth daily. 12/09/18   Nance Pear, MD  pregabalin (LYRICA) 100 MG capsule Take 1 capsule (100 mg total) by mouth 2 (two) times daily. 03/02/19   Gladstone Lighter, MD  psyllium (METAMUCIL) 58.6 % powder Take 1 packet by mouth daily as needed.    [provider]  traMADol-acetaminophen (ULTRACET) 37.5-325 MG tablet Take 1 tablet by mouth every 6 (six) hours as needed for moderate pain. 03/16/19   Glean Hess, MD  Triamcinolone Acetonide (TRIAMCINOLONE 0.1 % CREAM : EUCERIN) CREA Apply 1 application topically daily.     [provider]  gabapentin (NEURONTIN) 100 MG capsule Take 1 capsule (100 mg total) by mouth 3 (three) times daily. Patient not taking: Reported on 01/12/2019 12/18/18 02/01/19  Glean Hess, MD    Allergies  Allergen Reactions  . Metformin Nausea Only    Family History  Problem Relation Age of Onset  . Cancer Mother   . Heart disease Father   . Diabetes Son   . Breast cancer Neg Hx     Social History Social History   Tobacco Use  . Smoking status: Former Smoker    Packs/day: 0.25    Years: 20.00    Pack years: 5.00    Types: Cigarettes  . Smokeless tobacco: Never Used  Substance Use Topics  . Alcohol use: No    Alcohol/week: 0.0 standard drinks  . Drug use: No    Review of Systems Constitutional: Negative for fever. Cardiovascular: Negative for chest pain. Respiratory: Negative for shortness of breath. Gastrointestinal: Negative for abdominal pain, vomiting  Musculoskeletal: Negative for musculoskeletal complaints Neurological: Negative for  headache All other ROS negative  ____________________________________________   PHYSICAL EXAM:  VITAL SIGNS: ED Triage Vitals  Enc Vitals Group     BP 04/13/19 1139 (!) 141/56     Pulse Rate 04/13/19 1139 81     Resp 04/13/19 1139 14     Temp 04/13/19 1139 98.3 F (36.8 C)     Temp Source 04/13/19 1139 Oral     SpO2 04/13/19 1139 100 %     Weight 04/13/19 1140 146 lb (66.2 kg)     Height 04/13/19 1140 5\' 6"  (1.676 m)     Head Circumference --      Peak Flow --      Pain Score 04/13/19 1140 10     Pain Loc --      Pain Edu? --      Excl. in [place]? --  Constitutional: Alert and oriented. Well appearing and in no distress. Eyes: Normal exam ENT      Head: Normocephalic and atraumatic.      Mouth/Throat: Mucous membranes are moist. Cardiovascular: Normal rate, regular rhythm. Respiratory: Normal respiratory effort without tachypnea nor retractions. Breath sounds are clear and equal bilaterally. No wheezes/rales/rhonchi. Gastrointestinal: Soft and nontender. No distention.   Musculoskeletal: Tenderness palpation over her old shingles rash.  States occasional shooting pain down her legs.  None during my exam. Neurologic:  Normal speech and language. No gross focal neurologic deficits  Skin: Patient has resolving shingles rash to her left pelvis and left lower back.  No open sores or blistering. Psychiatric: Mood and affect are normal.   ____________________________________________   INITIAL IMPRESSION / ASSESSMENT AND PLAN / ED COURSE  Pertinent labs & imaging results that were available during my care of the patient were reviewed by me and considered in my medical decision making (see chart for details).   Patient presents emergency department for pain over her shingles signs as well as occasional radiating pain down her left leg.  No weakness numbness or incontinence.  Patient is lab work is largely within normal limits.  Patient appears overall very well.  We will place the  patient on Norco for her pain.  I highly suspect her pain to be a combination of both postherpetic neuralgia as well as her recently diagnosed spinal stenosis which I reviewed her CT from the end of July.  With no weakness numbness or incontinence I do not believe further imaging such as MRI is warranted at this time.  I did refer the patient to orthopedics for further evaluation and follow-up.  Patient agreeable to plan of care.  Clide Dales Rosenblum was evaluated in Emergency Department on 04/13/2019 for the symptoms described in the history of present illness. She was evaluated in the context of the global COVID-19 pandemic, which necessitated consideration that the patient might be at risk for infection with the SARS-CoV-2 virus that causes COVID-19. Institutional protocols and algorithms that pertain to the evaluation of patients at risk for COVID-19 are in a state of rapid change based on information released by regulatory bodies including the CDC and federal and state organizations. These policies and algorithms were followed during the patient's care in the ED.  ____________________________________________   FINAL CLINICAL IMPRESSION(S) / ED DIAGNOSES  Postherpetic neuralgia Spinal stenosis   Minna Antis, MD 04/13/19 1453

## 2019-04-13 NOTE — Discharge Instructions (Signed)
Leave urgent care and proceed to the emergency department at [facility]. I have spoken with them and they are waiting for you.   Honor Loh, MSN, APRN, FNP-C, CEN Advanced Practice Provider [facility] 04/13/2019 10:47 AM

## 2019-04-13 NOTE — ED Triage Notes (Signed)
Patient states that she has had Shingles since April and states that it started back in her left leg.

## 2019-04-13 NOTE — ED Provider Notes (Addendum)
[place]   Name: Jordan Montoya DOB: 09-28-1933 MRN: [id number] CSN: [id number] PCP: Reubin Milan, MD  Arrival date and time:  04/13/19 0955  Chief Complaint:  Rash   NOTE: Prior to seeing the patient today, I have reviewed the triage nursing documentation and vital signs. Clinical staff has updated patient's PMH/PSHx, current medication list, and drug allergies/intolerances to ensure comprehensive history available to assist in medical decision making.   History:   HPI: Jordan Montoya is a 85 y.o. female who presents today with complaints of pain in her LEFT lower extremity. Pain has been persistent since April 2020 when patient was diagnosed with a severe case of shingles. Patient has been seen here for pain control in the past. She is also working with her PCP Otho Darner, MD) to find an effective pain regimen for her post herpetic neuralgia. Patient currently taking Lyrica, however notes that the medication is causing numbness in her LEFT lower extremity. She has reduced the dose at home, however notes that the pain was too severe and she ended up having to take the dose recommended by Dr. Otho Darner. Patient currently taking Lyrica 100 mg BID. She has taken oxycodone in the past, however reports that it really was not effective. Patient adds that her son does not want her taking oxycodone because it "makes her crazy".  Patient is very anxious and reporting that she has had issues sleeping because of her pain and anxiety. PCP refilled anxiolytic yesterday.   Patient presents today with pain rated 10/10 in severity. She states, "I need something for the pain. It is making me crazy. I can't live like this. I don't want to be in this world if I have to live like this. I am not going to say what I am going to do, but if this pain keeps on, I am going to do something to make it stop permanently". Patient has no active plan to harm herself, however she states, "I am just not going  to say anything else about that". In review of her EMR, patient expressed similar comments when seen here by Dr. Adriana Simas on 02/01/2019. Patient is accompanied to the clinic today by her caregiver Jordan Montoya) who does not feel as if a patient is a true harm to herself.   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   Psoriasis 11/25/2014   followed by Dermatology    Type 2 diabetes mellitus with hyperosmolarity without coma, without long-term current use of insulin (HCC) 09/30/2015   Unspecified septicemia(038.9) (HCC) 02/28/2019    Past Surgical History:  Procedure Laterality Date   BREAST BIOPSY Left    neg-bx/clip   BREAST CYST EXCISION Left    neg   EYE SURGERY Bilateral    cataracts   PARTIAL HYSTERECTOMY     one ovary remains    Family History  Problem Relation Age of Onset   Cancer Mother    Heart disease Father    Diabetes Son    Breast cancer Neg Hx     Social History   Tobacco Use   Smoking status: Former Smoker    Packs/day: 0.25    Years: 20.00    Pack years: 5.00  Types: Cigarettes   Smokeless tobacco: Never Used  Substance Use Topics   Alcohol use: No    Alcohol/week: 0.0 standard drinks   Drug use: No    Patient Active Problem List   Diagnosis Date Noted   Herpes zoster 03/20/2019   Spinal stenosis of lumbar region at multiple levels 03/16/2019   Anxiety disorder 11/16/2018   Primary osteoarthritis of both hands 08/16/2018   Arthralgia of multiple sites 05/18/2018   Pernicious anemia 12/29/2017   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   ferrous sulfate 325 (65 FE) MG tablet Take 325 mg by mouth as directed.    glimepiride (AMARYL) 1 MG tablet TAKE 1 TABLET (1 MG TOTAL) BY MOUTH DAILY WITH BREAKFAST.   JANUVIA 100 MG tablet TAKE 1 TABLET (100 MG TOTAL) BY MOUTH DAILY.   metoprolol succinate (TOPROL-XL) 25 MG 24 hr tablet Take 25 mg by mouth daily.    pregabalin (LYRICA) 100 MG capsule Take 1 capsule (100 mg total) by mouth 2 (two) times daily.   psyllium (METAMUCIL) 58.6 % powder Take 1 packet by mouth daily as needed.   traMADol-acetaminophen (ULTRACET) 37.5-325 MG tablet Take 1 tablet by mouth every 6 (six) hours as needed for moderate pain.    Allergies:   Metformin  Review of Systems (ROS): Review of Systems  Constitutional: Negative for chills and fever.  Respiratory: Negative for cough and shortness of breath.   Cardiovascular: Negative for chest pain and palpitations.  Musculoskeletal:       Pain in LLE  Skin:       Scarring from healing HZV rash  Neurological: Positive for numbness (LLE). Negative for dizziness, syncope, weakness and headaches.  Psychiatric/Behavioral: Positive for sleep disturbance (2/2 pain) and suicidal ideas (vague references made (see HPI); will not overtly admit to SI). The patient is nervous/anxious.   All other systems reviewed and are negative.    Vital Signs: Today's Vitals   04/13/19 1007 04/13/19 1011 04/13/19 1048  BP:  (!) 145/51   Pulse:  78   Resp:  14   Temp:  98 F (36.7 C)   TempSrc:  Oral   SpO2:  95%   Weight: 140 lb (63.5 kg)    Height: 5\' 6"  (1.676  m)    PainSc: 10-Worst pain ever  10-Worst pain ever    Physical Exam: Physical Exam  Constitutional: She is oriented to person, place, and time and well-developed, well-nourished, and in no distress.  HENT:  Head: Normocephalic and atraumatic.  Mouth/Throat: Mucous membranes are normal.  Eyes: Pupils are equal, round, and reactive to light. EOM are normal.  Neck: Normal range of motion. Neck supple. No tracheal deviation present.  Cardiovascular: Normal rate, regular rhythm, normal heart sounds and intact distal pulses. Exam reveals no gallop and no friction rub.  No murmur heard. Pulmonary/Chest: Effort normal and breath sounds normal. No respiratory distress. She has no wheezes. She has no rales.  Abdominal: Soft. Normal appearance. There is no abdominal tenderness.  Musculoskeletal:     Left hip: She exhibits tenderness (overlying healing HZV rash).     Lumbar back: She exhibits no tenderness.     Left upper leg: She exhibits tenderness (generalized). She exhibits no swelling, no edema and no deformity.     Comments: Numbness to LLE; (+) PMS noted distally. Reports shooting pains from LEFT lower back that wrap around and extend distally into LLE.   Neurological: She is alert and oriented to person, place, and time. She has normal sensation, normal strength, normal reflexes and intact cranial nerves.  Skin: Skin is warm and dry.  Resolving/lealing HZV rash to LEFT pelvis. (+) tenderness overlying pelvis and extending into groin and into LLE. There are no open lesions or areas of active blistering noted.   Psychiatric: Memory, affect and judgment normal. Her mood appears anxious. She expresses no suicidal plans.  Nursing note and vitals reviewed.   Urgent Care Treatments / Results:   LABS: PLEASE NOTE: all labs that were ordered this encounter are listed, however only abnormal results are displayed. Labs Reviewed - No data to display  EKG: -None  RADIOLOGY: No results  found.  PROCEDURES: Procedures  MEDICATIONS RECEIVED THIS VISIT: Medications - No data to display  PERTINENT CLINICAL COURSE NOTES/UPDATES: Clinical Course as of Apr 14 1803  Fri Apr 13, 2019  1030 Spoke with PCP Otho Darner(Burglund, MD) to discuss patient's symptoms. Patient self titrating medication at home. Recommended to increased Lyrica dose from BID to TID based on current dose. Also, recommended steroid taper. Patient refused citing that steroids affected sleep quality. Patient with continued statements concerning for SI with no definitive plans. Will send to ED for further evaluation of pain and consideration of assessment by psychiatry as deemed appropriate by EDP.    [BG]    Clinical Course User Index [BG] Verlee MonteGray, Ashly Goethe E, NP   Initial Impression / Assessment and Plan / Urgent Care Course:  Pertinent labs & imaging results that were available during my care of the patient were personally reviewed by me and considered in my medical decision making (see lab/imaging section of note for values and interpretations).  [name] Madilyn FiremanHayes is a 85 y.o. female who [facility] with complaints of Rash   Patient is well appearing overall in clinic today. She does not appear to be in any acute distress. She is observed rubbing her LEFT thigh consistently during visit. Presenting symptoms (see HPI) and exam as documented above. Patient has been suffering with the sequela associated with HZV since 11/2018. She is experiencing neuropathic pain (PHN) following viral infection. Patient has had difficulties managing her pain despite interventions this far. She is currently taking Lyrica, however she feels as if it is causing her to have numbness in her LEFT leg. Patient had CT imaging of her spine on 03/01/2019, which reveled spinal stenosis. Discussed that numbness could be associated with this. Discussed may benefit from further evaluation by neurosurgery and pain management. Patient  wishes to discuss this with her PCP at next visit.   I have spoken with her PCP Otho Darner(Burglund, MD) today via phone. Discussed presenting complaints of pain and statements made by the patient that are concerning for suicidality. MD recommended up titration of patient's Lyrica dose and addition of a systemic steroid taper. Recommendations discussed with patient and she became very upset. Patient advising that larger doses of Lyrica are causing her "to be worse" with regards  to the numbness in her leg. She adamantly refuses treatment with systemic steroids citing that they cause her to be unable to sleep. Risks versus benefits review of bother interventions, however patient remains resistant to recommendations. Patient states, "I know something has to be done. I cannot and will not live like this. I am not going to say what I am going to do, but I can't take this anymore". I spoke with her caregiver Jordan Montoya(Jordan Montoya) alone and she feels that patient is just in so much pain that she says things like that, but does not feel as if patient is an actual threat to herself. Patient was offered Norco, however she states, "I don't know if that will work for this. It might make me crazy like the oxycodone did. If it does, my son won't let me take it and I will be back to suffering again. I can't take this anymore".   In light of the fact that patient is refusing what has been offered here, we discussed the potential of having the patient seen in the emergency department today. I would feel more comfortable if she were able to be seen by a physician to discuss her uncontrolled pain. I have concerns with statements that have been made in clinic today; concerned that patient may harm herself. Patient initially resistant to being seen in the emergency department, however after discussion regarding pain relief, we ultimately decided to proceed to [facility]. Patient report was called to ED charge nurse Earlene Plater(Davis, RN). She was advised  of HPI, assessment, and concerns for patient safety. Questions fielded. Patient will be presenting to the hospital via POV driven by her caregiver. ED nurse was advised to return [facility] with any questions or concerns pertaining to the [facility] that [facility] here today.    Final Clinical Impressions / Urgent Care Diagnoses:   Final diagnoses:  Post herpetic neuralgia  Severe pain    New Prescriptions:  [place] Controlled Substance Registry consulted? No  No orders of the defined types were placed in this encounter.   Recommended Follow up Care:  Patient encouraged to follow up with the following provider within the specified time frame, or sooner as dictated by the severity of her symptoms. As always, she was instructed that for any urgent/emergent care needs, she should seek care either here or in the emergency department for more immediate evaluation.  Follow-up Information    Go to  [facility] EMERGENCY DEPARTMENT.   Specialty: Emergency Medicine Contact information: [address] [id number] [place] [id number] [phone]        NOTE: This note was prepared using Dragon dictation software along with smaller phrase technology. Despite my best ability to proofread, there is the potential that transcriptional errors may still occur from this process, and are completely unintentional.   Verlee MonteGray, [place] Penny E, NP 04/14/19 1807

## 2019-04-14 ENCOUNTER — Encounter: Payer: Self-pay | Admitting: Urgent Care

## 2019-04-16 ENCOUNTER — Telehealth: Payer: Self-pay

## 2019-04-16 ENCOUNTER — Other Ambulatory Visit: Payer: Self-pay | Admitting: Internal Medicine

## 2019-04-16 DIAGNOSIS — B029 Zoster without complications: Secondary | ICD-10-CM

## 2019-04-16 MED ORDER — HYDROCODONE-ACETAMINOPHEN 5-325 MG PO TABS: 1.0000 | ORAL_TABLET | ORAL | 0 refills | Status: DC | PRN

## 2019-04-16 NOTE — Telephone Encounter (Signed)
Pt called the office wanting to let us know what she had to see the ER Friday because of her pain. ( see note in chart )  She is calling because she was given 20 tablets of hydrocodone as needed for pain. She wants to know if she can get a RF from you for this. It looks like she was also referred to orthopedic surgery for her spinal stenosis- Dr. Rudene Christians.   Please advise?  ** Patient did leave me a VM. I tried to call her back 3 times and each time her phone line rang busy. **

## 2019-04-16 NOTE — Telephone Encounter (Signed)
I sent her in 40 vicodan and these need to last 10 days.  Ignore the printed Rx that I did by accident.

## 2019-04-17 NOTE — Telephone Encounter (Signed)
Patient came into the office today. ( Was unable to reach her yesterday) The front desk let her know her Vicodin has been sent in but the 40 tablets has to last 10 days.   Pt verbalized understanding of this .

## 2019-04-23 ENCOUNTER — Other Ambulatory Visit: Payer: Self-pay

## 2019-04-23 DIAGNOSIS — M48061 Spinal stenosis, lumbar region without neurogenic claudication: Secondary | ICD-10-CM

## 2019-04-24 ENCOUNTER — Other Ambulatory Visit: Payer: Self-pay | Admitting: Student

## 2019-04-24 DIAGNOSIS — M5416 Radiculopathy, lumbar region: Secondary | ICD-10-CM

## 2019-04-26 ENCOUNTER — Ambulatory Visit
Admission: RE | Admit: 2019-04-26 | Discharge: 2019-04-26 | Disposition: A | Discharge status: Home / Self Care | Payer: Medicare Other | Source: Ambulatory Visit | Attending: Student | Admitting: Student

## 2019-04-26 DIAGNOSIS — M5416 Radiculopathy, lumbar region: Secondary | ICD-10-CM

## 2019-04-26 MED ORDER — DIAZEPAM 5 MG PO TABS
5.0000 mg | ORAL_TABLET | Freq: Once | ORAL | Status: AC
  Administered 2019-04-26: 10:00:00 5 mg via ORAL

## 2019-04-26 MED ORDER — METHYLPREDNISOLONE ACETATE 40 MG/ML INJ SUSP (RADIOLOG
120.0000 mg | Freq: Once | INTRAMUSCULAR | Status: AC
  Administered 2019-04-26: 11:00:00 120 mg via EPIDURAL

## 2019-04-26 MED ORDER — IOPAMIDOL (ISOVUE-M 200) INJECTION 41%
1.0000 mL | Freq: Once | INTRAMUSCULAR | Status: AC
  Administered 2019-04-26: 11:00:00 1 mL via EPIDURAL

## 2019-04-26 NOTE — Discharge Instructions (Signed)

## 2019-04-27 ENCOUNTER — Ambulatory Visit: Payer: Medicare Other | Admitting: Internal Medicine

## 2019-05-01 ENCOUNTER — Other Ambulatory Visit: Payer: Self-pay

## 2019-05-01 ENCOUNTER — Encounter: Payer: Self-pay | Admitting: Internal Medicine

## 2019-05-01 ENCOUNTER — Ambulatory Visit: Payer: Medicare Other | Admitting: Internal Medicine

## 2019-05-01 VITALS — BP 124/68 | HR 82 | Ht 66.0 in | Wt 148.8 lb

## 2019-05-01 DIAGNOSIS — B0229 Other postherpetic nervous system involvement: Secondary | ICD-10-CM

## 2019-05-01 DIAGNOSIS — R300 Dysuria: Secondary | ICD-10-CM | POA: Diagnosis not present

## 2019-05-01 DIAGNOSIS — B354 Tinea corporis: Secondary | ICD-10-CM

## 2019-05-01 DIAGNOSIS — M48061 Spinal stenosis, lumbar region without neurogenic claudication: Secondary | ICD-10-CM

## 2019-05-01 LAB — POCT URINALYSIS DIPSTICK
Bilirubin, UA: NEGATIVE
Blood, UA: NEGATIVE
Glucose, UA: NEGATIVE
Ketones, UA: NEGATIVE
Leukocytes, UA: NEGATIVE
Nitrite, UA: NEGATIVE
Protein, UA: NEGATIVE
Spec Grav, UA: 1.025 (ref 1.010–1.025)
Urobilinogen, UA: 0.2 E.U./dL
pH, UA: 5 (ref 5.0–8.0)

## 2019-05-01 MED ORDER — NYSTATIN 100000 UNIT/GM EX CREA: 1.0000 "application " | TOPICAL_CREAM | Freq: Two times a day (BID) | CUTANEOUS | 0 refills | Status: DC

## 2019-05-01 NOTE — Progress Notes (Signed)
Date:  05/01/2019   Name:  Jordan Montoya   DOB:  August 13, 1933   MRN:  [id number]   Chief Complaint: Spinal Stenosis (Pt came with person who cares for her at home- Eber Jones. ) and Dysuria (Burning while urination X 2 weeks,.)  Back Pain Associated symptoms include dysuria. Pertinent negatives include no abdominal pain, chest pain, fever, headaches or pelvic pain.  Dysuria  This is a new problem. The current episode started in the past 7 days. The problem has been unchanged. The quality of the pain is described as burning. The pain is mild. There has been no fever. Pertinent negatives include no chills, discharge, hematuria or hesitancy.  Seen by Neurosurgery and referred to pain management but pt declined the appointment when they called.  Neurosurgery assessment: Assessment and Plan: Jordan Montoya is a pleasant 85 y.o. female with post herpetic neuralgia and spinal stenosis with radicular complaints. We discussed conservative management options versus surgical intervention options. She is not interested in pursuing surgical intervention at this time. Discussed physical therapy, but she states that she has been receiving this at home and it is worsened symptoms. Discussed epidural steroid injections and she would like to proceed as they also talked about in the emergency department. Patient requests refill on hydrocodone, but I advised her that we do not provide pain medication for chronic conditions. Recommended discussing pain medication management with primary care provider as well as potentially increasing Lyrica dosage for worsening pain. Advised that if she wish to pursue surgical intervention, MRI would be warranted. States that she is unable to perform this due significant claustrophobia. Also discussed CT myelogram as alternative but she is not interested in pursuing this either. Recommended referral to pain management clinic for medication management as well as consideration of epidural  steroid injections. She was agreeable. Advised that she may follow-up if conservative management fails and she would like to pursue surgical intervention. She expressed understanding.  Lumbar CT 03/01/2019: IMPRESSION: Mild spinal stenosis L3-4  Moderate spinal stenosis L4-5 with moderate subarticular stenosis bilaterally  Severe facet degeneration on the left with severe left subarticular stenosis. Mild to moderate subarticular stenosis on the right.  On 04/26/2019 she was give a Caudal epidural injection at [facility].  She far she has not noticed much improvement. She continues to take Lyrica three times a day and Hydrocodone 2-3 times per day.  Review of Systems  Constitutional: Negative for chills, fever and unexpected weight change.  Respiratory: Negative for chest tightness and shortness of breath.   Cardiovascular: Negative for chest pain, palpitations and leg swelling.  Gastrointestinal: Negative for abdominal pain, constipation and diarrhea.  Genitourinary: Positive for dysuria. Negative for genital sores, hematuria, hesitancy and pelvic pain.  Musculoskeletal: Positive for arthralgias, back pain and gait problem.  Neurological: Negative for dizziness, light-headedness and headaches.  Psychiatric/Behavioral: Positive for sleep disturbance. Negative for decreased concentration and dysphoric mood. The patient is nervous/anxious.     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 (HCC) 09/30/2015   Environmental and seasonal allergies 05/30/2015   Hip pain, chronic 01/27/2015   Major depressive disorder with single episode, in partial remission (HCC)  11/25/2014   Avitaminosis D  11/25/2014   Psoriasis 11/25/2014   Hyperlipidemia associated with type 2 diabetes mellitus ([place]) 08/20/2011    Allergies  Allergen Reactions   Metformin Nausea Only    Past Surgical History:  Procedure Laterality Date   BREAST BIOPSY Left    neg-bx/clip   BREAST CYST EXCISION Left    neg   EYE SURGERY Bilateral    cataracts   PARTIAL HYSTERECTOMY     one ovary remains    Social History   Tobacco Use   Smoking status: Former Smoker    Packs/day: 0.25    Years: 20.00    Pack years: 5.00    Types: Cigarettes   Smokeless tobacco: Never Used  Substance Use Topics   Alcohol use: No    Alcohol/week: 0.0 standard drinks   Drug use: No     Medication list has been reviewed and updated.  Current Meds  Medication Sig   acetaminophen (TYLENOL) 650 MG CR tablet Take 650 mg by mouth every 8 (eight) hours as needed for pain.    ALPRAZolam (XANAX) 0.25 MG tablet TAKE 1 TABLET BY MOUTH DAILY AS NEEDED FOR ANXIETY   Ascorbic Acid (VITAMIN C) 1000 MG tablet Take 1,000 mg by mouth daily.   COSENTYX SENSOREADY 300 DOSE 150 MG/ML SOAJ Inject 600 mg into the skin every 30 (thirty) days.   glimepiride (AMARYL) 1 MG tablet TAKE 1 TABLET (1 MG TOTAL) BY MOUTH DAILY WITH BREAKFAST.   HYDROcodone-acetaminophen (NORCO/VICODIN) 5-325 MG tablet Take 1 tablet by mouth every 4 (four) hours as needed for moderate pain.   JANUVIA 100 MG tablet TAKE 1 TABLET (100 MG TOTAL) BY MOUTH DAILY.   metoprolol succinate (TOPROL-XL) 25 MG 24 hr tablet Take 12.5 mg by mouth daily.    naproxen sodium (ALEVE) 220 MG tablet Take 220 mg by mouth.   ondansetron (ZOFRAN ODT) 4 MG disintegrating tablet Take 1 tablet (4 mg total) by mouth every 8 (eight) hours as needed for nausea or vomiting.   pregabalin (LYRICA) 100 MG capsule TAKE 1 CAPSULE BY MOUTH THREE TIMES A DAY (Patient taking differently: Take 100 mg by mouth 2 (two) times daily. )   vitamin B-12  (CYANOCOBALAMIN) 500 MCG tablet Take 500 mcg by mouth daily.   [DISCONTINUED] traMADol-acetaminophen (ULTRACET) 37.5-325 MG tablet Take 1 tablet by mouth every 6 (six) hours as needed for moderate pain.    PHQ 2/9 Scores 05/01/2019 03/16/2019 12/05/2018 11/16/2018  PHQ - 2 Score 0 5 3 1   PHQ- 9 Score 3 7 12 6     BP Readings from Last 3 Encounters:  05/01/19 124/68  04/26/19 (!) 150/60  04/13/19 (!) 154/60    Physical Exam Vitals signs and nursing note reviewed.  Constitutional:      General: She is not in acute distress.    Appearance: She is well-developed.  HENT:     Head: Normocephalic and atraumatic.  Cardiovascular:     Rate and Rhythm: Normal rate and regular rhythm.     Heart sounds: No murmur.  Pulmonary:     Effort: Pulmonary effort is normal. No respiratory distress.     Breath sounds: No stridor.  Abdominal:     General: Bowel sounds are normal.     Palpations: Abdomen is soft.     Tenderness: There is no abdominal tenderness.  Musculoskeletal: Normal range of motion.     Lumbar back: She exhibits tenderness and bony tenderness.     Right lower leg: No edema.  Left lower leg: No edema.  Skin:    General: Skin is warm and dry.     Findings: Rash present.       Neurological:     Mental Status: She is alert and oriented to person, place, and time.  Psychiatric:        Attention and Perception: Attention normal.        Mood and Affect: Mood is anxious.        Behavior: Behavior normal.        Thought Content: Thought content normal.        Cognition and Memory: Cognition normal.     Wt Readings from Last 3 Encounters:  05/01/19 148 lb 12.8 oz (67.5 kg)  04/13/19 146 lb (66.2 kg)  04/13/19 140 lb (63.5 kg)    BP 124/68    Pulse 82    Ht 5\' 6"  (1.676 m)    Wt 148 lb 12.8 oz (67.5 kg)    SpO2 95%    BMI 24.02 kg/m   Assessment and Plan: 1. Spinal stenosis of lumbar region at multiple levels No response to the ESI done several days ago Pt advised that  she will need to establish with pain management to continue pain medications and investigate additional ESI - Ambulatory referral to Pain Clinic  2. Post herpetic neuralgia Continue Lyrica 100 mg tid May continue Norco 2-3 times a day - call for new Rx in about 10 days; advised I will continue to prescribe this until pain management takes over  3. Tinea corporis - nystatin cream (MYCOSTATIN); Apply 1 application topically 2 (two) times daily. Apply a pea sized amount to each groin fold twice a day for tinea corporis  Dispense: 30 g; Refill: 0  4. Dysuria UA negative - pt reassured - POCT Urinalysis Dipstick   Partially dictated using Animal nutritionistDragon software. Any errors are unintentional.  Bari EdwardLaura Alizea Pell, MD [facility]  05/01/2019

## 2019-05-02 ENCOUNTER — Telehealth: Payer: Self-pay

## 2019-05-02 NOTE — Telephone Encounter (Signed)
Patient wanted to call and let you know she has a pain management appt on 011/04/2019 at 130 PM.  FYI

## 2019-05-08 ENCOUNTER — Other Ambulatory Visit: Payer: Self-pay | Admitting: Internal Medicine

## 2019-05-08 ENCOUNTER — Telehealth: Payer: Self-pay

## 2019-05-08 DIAGNOSIS — M48061 Spinal stenosis, lumbar region without neurogenic claudication: Secondary | ICD-10-CM

## 2019-05-08 DIAGNOSIS — B0229 Other postherpetic nervous system involvement: Secondary | ICD-10-CM

## 2019-05-08 MED ORDER — HYDROCODONE-ACETAMINOPHEN 5-325 MG PO TABS: 1.0000 | ORAL_TABLET | Freq: Four times a day (QID) | ORAL | 0 refills | Status: AC | PRN

## 2019-05-08 NOTE — Telephone Encounter (Signed)
Requesting Anxiety refills and Pain med refills will run out over weekend and states since we are closed Fridays she needs this Rx today.

## 2019-05-08 NOTE — Telephone Encounter (Signed)
I sent in refill on Norco.  She has 5 refills left on her anxiety medication.

## 2019-05-08 NOTE — Telephone Encounter (Signed)
Patient informed. 

## 2019-05-17 ENCOUNTER — Other Ambulatory Visit: Payer: Self-pay | Admitting: Internal Medicine

## 2019-05-17 DIAGNOSIS — B029 Zoster without complications: Secondary | ICD-10-CM

## 2019-05-24 ENCOUNTER — Ambulatory Visit (INDEPENDENT_AMBULATORY_CARE_PROVIDER_SITE_OTHER): Payer: Medicare Other

## 2019-05-24 ENCOUNTER — Other Ambulatory Visit: Payer: Self-pay

## 2019-05-24 DIAGNOSIS — Z23 Encounter for immunization: Secondary | ICD-10-CM | POA: Diagnosis not present

## 2019-05-28 ENCOUNTER — Other Ambulatory Visit: Payer: Self-pay | Admitting: Internal Medicine

## 2019-05-28 DIAGNOSIS — E118 Type 2 diabetes mellitus with unspecified complications: Secondary | ICD-10-CM

## 2019-05-28 DIAGNOSIS — M48061 Spinal stenosis, lumbar region without neurogenic claudication: Secondary | ICD-10-CM

## 2019-05-28 DIAGNOSIS — Z87891 Personal history of nicotine dependence: Secondary | ICD-10-CM

## 2019-05-28 DIAGNOSIS — E785 Hyperlipidemia, unspecified: Secondary | ICD-10-CM

## 2019-05-28 DIAGNOSIS — M50322 Other cervical disc degeneration at C5-C6 level: Secondary | ICD-10-CM

## 2019-05-28 DIAGNOSIS — L409 Psoriasis, unspecified: Secondary | ICD-10-CM

## 2019-05-28 DIAGNOSIS — F411 Generalized anxiety disorder: Secondary | ICD-10-CM

## 2019-05-28 DIAGNOSIS — Z8701 Personal history of pneumonia (recurrent): Secondary | ICD-10-CM

## 2019-05-28 DIAGNOSIS — B0229 Other postherpetic nervous system involvement: Secondary | ICD-10-CM

## 2019-05-28 DIAGNOSIS — Z9181 History of falling: Secondary | ICD-10-CM

## 2019-05-28 DIAGNOSIS — E1169 Type 2 diabetes mellitus with other specified complication: Secondary | ICD-10-CM

## 2019-05-28 DIAGNOSIS — M50321 Other cervical disc degeneration at C4-C5 level: Secondary | ICD-10-CM

## 2019-05-28 DIAGNOSIS — F324 Major depressive disorder, single episode, in partial remission: Secondary | ICD-10-CM

## 2019-05-28 NOTE — Progress Notes (Signed)
Received orders from [facility]. Start of care 03/09/2019.  Re-certification period 05/08/2019 through 07/06/2019. Orders are reviewed, signed and faxed.

## 2019-05-30 ENCOUNTER — Other Ambulatory Visit: Payer: Self-pay

## 2019-05-30 ENCOUNTER — Ambulatory Visit
Admission: RE | Admit: 2019-05-30 | Discharge: 2019-05-30 | Disposition: A | Discharge status: Home / Self Care | Payer: Medicare Other | Source: Ambulatory Visit | Attending: Internal Medicine | Admitting: Internal Medicine

## 2019-05-30 DIAGNOSIS — Z1231 Encounter for screening mammogram for malignant neoplasm of breast: Secondary | ICD-10-CM | POA: Diagnosis present

## 2019-06-11 ENCOUNTER — Encounter: Payer: Self-pay | Admitting: Student in an Organized Health Care Education/Training Program

## 2019-06-11 ENCOUNTER — Other Ambulatory Visit: Payer: Self-pay

## 2019-06-11 ENCOUNTER — Ambulatory Visit
Payer: Medicare Other | Attending: Student in an Organized Health Care Education/Training Program | Admitting: Student in an Organized Health Care Education/Training Program

## 2019-06-11 VITALS — BP 129/61 | HR 71 | Temp 98.0°F | Ht 66.0 in | Wt 149.0 lb

## 2019-06-11 DIAGNOSIS — M48061 Spinal stenosis, lumbar region without neurogenic claudication: Secondary | ICD-10-CM | POA: Insufficient documentation

## 2019-06-11 DIAGNOSIS — M2550 Pain in unspecified joint: Secondary | ICD-10-CM | POA: Insufficient documentation

## 2019-06-11 DIAGNOSIS — B0229 Other postherpetic nervous system involvement: Secondary | ICD-10-CM | POA: Insufficient documentation

## 2019-06-11 DIAGNOSIS — G8929 Other chronic pain: Secondary | ICD-10-CM

## 2019-06-11 DIAGNOSIS — M25551 Pain in right hip: Secondary | ICD-10-CM | POA: Diagnosis not present

## 2019-06-11 DIAGNOSIS — G894 Chronic pain syndrome: Secondary | ICD-10-CM | POA: Insufficient documentation

## 2019-06-11 NOTE — Progress Notes (Signed)
Patient's Name: Jordan Montoya  MRN: [id number]  Referring Provider: Marin Olp, PA-C  DOB: [birth year]  PCP: Glean Hess, MD  DOS: 06/11/2019  Note by: Gillis Santa, MD  Service setting: Ambulatory outpatient  Specialty: Interventional Pain Management  Location: [facility] (AMB) Pain Management Facility  Visit type: Initial Patient Evaluation  Patient type: New Patient   Primary Reason(s) for Visit: Encounter for initial evaluation of one or more chronic problems (new to examiner) potentially causing chronic pain, and posing a threat to normal musculoskeletal function. (Level of risk: High) CC: Groin Pain  HPI  Jordan Montoya is a 85 y.o. year old, female patient, who comes today to see [place] for the first time for an initial evaluation of her chronic pain. She has Major depressive disorder with single episode, in partial remission ([place]); Avitaminosis D; Psoriasis; Hyperlipidemia associated with type 2 diabetes mellitus ([place]); Hip pain, chronic; Environmental and seasonal allergies; Type II diabetes mellitus with complication ([place]); Pernicious anemia; Arthralgia of multiple sites; Primary osteoarthritis of both hands; Anxiety disorder; Spinal stenosis of lumbar region at multiple levels; Post herpetic neuralgia; and Chronic pain syndrome on their problem list. Today she comes in for evaluation of her Groin Pain  Pain Assessment: Location: Left Groin(left side) Radiating: pain radiaties down to buttock Onset: More than a month ago Duration: Chronic pain Quality: Burning, Penetrating, Throbbing Severity: 3 /10 (subjective, self-reported pain score)  Note: Reported level is compatible with observation.                         When using our objective Pain Scale, levels between 6 and 10/10 are said to belong in an emergency room, as it progressively worsens from a 6/10, described as severely limiting, requiring emergency care not usually available at an outpatient pain management facility. At a 6/10  level, communication becomes difficult and requires great effort. Assistance to reach the emergency department may be required. Facial flushing and profuse sweating along with potentially dangerous increases in heart rate and blood pressure will be evident. Effect on ADL: limits my daily actvities Timing: Intermittent Modifying factors: medications BP: 129/61  HR: 71  Onset and Duration: Sudden and Date of onset: April 2020 Cause of pain: diviticulitus, shingles, spinal stenosis Severity: Getting better, NAS-11 at its worse: 10/10, NAS-11 at its best: 5/10, NAS-11 now: 6/10 and NAS-11 on the average: 5/10 Timing: Not influenced by the time of the day Aggravating Factors: Prolonged sitting and Prolonged standing Alleviating Factors: Lying down, Resting, Sleeping and Warm showers or baths Associated Problems: Fatigue, Numbness and Tingling Quality of Pain: Aching, Burning, Sharp, Shooting and Tingling Previous Examinations or Tests: CT scan Previous Treatments: Epidural steroid injections  The patient comes into the clinics today for the first time for a chronic pain management evaluation.   -April-Oct shingles outbreak left side: lumbar spine, left hip, to her left groin -Also have chronic LE numbness -hx of spinal stenosis (lumbar): s/p caudal ESI not helpful -On Lyrica 100 mg TID, causing sedation -Does find benefit with PRN Hydrocodone (usually takes 0.5 - 1 tablet daily)- would like to continue -Primary issue now: PHN   Historic Controlled Substance Pharmacotherapy Review   05/17/2019  1   05/17/2019  Pregabalin 100 MG Capsule  90.00  30 [place]   [id number]   [place] ([id number])   0  2.01 LME  Medicare   [place]  05/12/2019  1   05/08/2019  Hydrocodone-Acetamin 5-325 MG  60.00  15  [place]   [id number]   [place] ([id number])   0  20.00 MME  Medicare   [place]  05/12/2019  1   04/11/2019  Alprazolam 0.25 MG Tablet  30.00  30 [place]   [id number]   [place] ([id number])   1  0.50 LME  Medicare   [place]    Medications: Patient  brought medications to be checked, as requested Pharmacodynamics: Desired effects: Analgesia: The patient reports >50% benefit. Reported improvement in function: The patient reports medication allows her to accomplish basic ADLs. Clinically meaningful improvement in function (CMIF): Sustained CMIF goals met Perceived effectiveness: Described as relatively effective, allowing for increase in activities of daily living (ADL) Undesirable effects: Side-effects or Adverse reactions: None reported Historical Monitoring: The patient  reports no history of drug use. List of all UDS Test(s): No results found for: MDMA, COCAINSCRNUR, [place], [place], CANNABQUANT, THCU, [place] List of other Serum/Urine Drug Screening Test(s):  No results found for: AMPHSCRSER, BARBSCRSER, BENZOSCRSER, COCAINSCRSER, COCAINSCRNUR, PCPSCRSER, PCPQUANT, THCSCRSER, THCU, CANNABQUANT, OPIATESCRSER, OXYSCRSER, PROPOXSCRSER, ETH Historical Background Evaluation: [place] PMP: PDMP reviewed during this encounter. Six (6) year initial data search conducted.             [place] Department of public safety, offender search: Editor, commissioning Information) Non-contributory Risk Assessment Profile: Aberrant behavior: None observed or detected today Risk factors for fatal opioid overdose: None identified today Fatal overdose hazard ratio (HR): Calculation deferred Non-fatal overdose hazard ratio (HR): Calculation deferred Risk of opioid abuse or dependence: 0.7-3.0% with doses ? 36 MME/day and 6.1-26% with doses ? 120 MME/day. Substance use disorder (SUD) risk level: Low  Pharmacologic Plan: As per protocol, I have not taken over any controlled substance management, pending the results of ordered tests and/or consults.            Initial impression: Pending review of available data and ordered tests.  Meds   Current Outpatient Medications:  .  acetaminophen (TYLENOL) 650 MG CR tablet, Take 650 mg by mouth every 8 (eight) hours as needed for pain.  , Disp: , Rfl:  .  ALPRAZolam (XANAX) 0.25 MG tablet, TAKE 1 TABLET BY MOUTH DAILY AS NEEDED FOR ANXIETY, Disp: 30 tablet, Rfl: 5 .  aspirin 81 MG chewable tablet, Chew by mouth daily., Disp: , Rfl:  .  COSENTYX SENSOREADY 300 DOSE 150 MG/ML SOAJ, Inject 600 mg into the skin every 30 (thirty) days., Disp: , Rfl:  .  docusate sodium (COLACE) 100 MG capsule, Take 200 mg by mouth 2 (two) times daily as needed for mild constipation., Disp: , Rfl:  .  glimepiride (AMARYL) 1 MG tablet, TAKE 1 TABLET (1 MG TOTAL) BY MOUTH DAILY WITH BREAKFAST., Disp: 90 tablet, Rfl: 0 .  HYDROcodone-acetaminophen (NORCO/VICODIN) 5-325 MG tablet, Take 1 tablet by mouth every 6 (six) hours as needed for moderate pain., Disp: 60 tablet, Rfl: 0 .  ibuprofen (ADVIL) 200 MG tablet, Take 400 mg by mouth every 8 (eight) hours as needed., Disp: , Rfl:  .  JANUVIA 100 MG tablet, TAKE 1 TABLET (100 MG TOTAL) BY MOUTH DAILY., Disp: 90 tablet, Rfl: 3 .  metoprolol succinate (TOPROL-XL) 25 MG 24 hr tablet, Take 12.5 mg by mouth daily. , Disp: , Rfl:  .  pregabalin (LYRICA) 100 MG capsule, Take 1 capsule (100 mg total) by mouth 3 (three) times daily., Disp: 90 capsule, Rfl: 5 .  Ascorbic Acid (VITAMIN C) 1000 MG tablet, Take 1,000 mg by mouth daily., Disp: , Rfl:  .  meloxicam (MOBIC)  7.5 MG tablet, Take 7.5 mg by mouth daily., Disp: , Rfl:  .  naproxen sodium (ALEVE) 220 MG tablet, Take 220 mg by mouth., Disp: , Rfl:  .  vitamin B-12 (CYANOCOBALAMIN) 500 MCG tablet, Take 500 mcg by mouth daily., Disp: , Rfl:   Imaging Review  Cervical Imaging:  Results for orders placed during the hospital encounter of 02/28/19  CT Cervical Spine Wo Contrast   Narrative CLINICAL DATA:  [place] at home, fever  EXAM: CT HEAD WITHOUT CONTRAST  CT CERVICAL SPINE WITHOUT CONTRAST  TECHNIQUE: Multidetector CT imaging of the head and cervical spine was performed following the standard protocol without intravenous contrast. Multiplanar CT image  reconstructions of the cervical spine were also generated.  COMPARISON:  None.  FINDINGS: CT HEAD FINDINGS  Brain: No acute territorial infarction, hemorrhage or intracranial mass. Mild atrophy and small vessel ischemic changes of the white matter. Punctate calcification with in the pons without associated mass effect.  Vascular: No hyperdense vessel.  Carotid vascular calcification  Skull: Normal. Negative for fracture or focal lesion.  Sinuses/Orbits: Mucosal thickening in the ethmoid sinuses  Other: None  CT CERVICAL SPINE FINDINGS  Alignment: Straightening of the cervical spine. No subluxation. Facet alignment within normal limits.  Skull base and vertebrae: No acute fracture. No primary bone lesion or focal pathologic process.  Soft tissues and spinal canal: No prevertebral fluid or swelling. No visible canal hematoma.  Disc levels: Moderate degenerative changes at C4-C5 and C5-C6. Multiple level bilateral facet degenerative change.  Upper chest: Negative.  Other: None  IMPRESSION: 1. No CT evidence for acute intracranial abnormality. Atrophy and mild small vessel ischemic changes of the white matter 2. Straightening of the cervical spine with degenerative change. No acute osseous abnormality   Electronically Signed   By: Donavan Foil M.D.   On: 02/28/2019 02:38    Results for orders placed during the hospital encounter of 02/28/19  CT LUMBAR SPINE WO CONTRAST   Narrative CLINICAL DATA:  Low back pain.  Recent falls.  EXAM: CT LUMBAR SPINE WITHOUT CONTRAST  TECHNIQUE: Multidetector CT imaging of the lumbar spine was performed without intravenous contrast administration. Multiplanar CT image reconstructions were also generated.  COMPARISON:  None.  FINDINGS: Segmentation: Normal  Alignment: Normal  Vertebrae: Negative for fracture or mass.  Paraspinal and other soft tissues: Negative for paraspinous mass or adenopathy. Large calcified  gallstones. Mild atherosclerotic aorta.  Disc levels: L1-2: Mild facet degeneration.  Negative for stenosis  L2-3: Mild disc bulging and mild facet degeneration. Negative for stenosis  L3-4: Mild disc bulging and moderate facet degeneration. Mild spinal stenosis.  L4-5: Disc degeneration with gas in the disc space. Diffuse disc bulging with endplate spurring. Advanced facet arthrosis bilaterally causing moderate spinal stenosis. Moderate subarticular stenosis bilaterally.  L5-S1: Severe facet degeneration with bony overgrowth left causing marked subarticular stenosis on the left. Expected impingement left S1 nerve root. Moderate right-sided facet arthrosis with mild to moderate right subarticular stenosis.  IMPRESSION: Mild spinal stenosis L3-4  Moderate spinal stenosis L4-5 with moderate subarticular stenosis bilaterally  Severe facet degeneration on the left with severe left subarticular stenosis. Mild to moderate subarticular stenosis on the right.  Large calcified gallstone   Electronically Signed   By: Franchot Gallo M.D.   On: 03/01/2019 14:37     Complexity Note: Imaging results reviewed. Results shared with Ms. Amedeo Plenty, using Layman's terms.  ROS  Cardiovascular: No reported cardiovascular signs or symptoms such as High blood pressure, coronary artery disease, abnormal heart rate or rhythm, heart attack, blood thinner therapy or heart weakness and/or failure Pulmonary or Respiratory: No reported pulmonary signs or symptoms such as wheezing and difficulty taking a deep full breath (Asthma), difficulty blowing air out (Emphysema), coughing up mucus (Bronchitis), persistent dry cough, or temporary stoppage of breathing during sleep Neurological: No reported neurological signs or symptoms such as seizures, abnormal skin sensations, urinary and/or fecal incontinence, being born with an abnormal open spine and/or a tethered spinal cord Review of  Past Neurological Studies:  Results for orders placed or performed during the hospital encounter of 02/28/19  CT Head Wo Contrast   Narrative   CLINICAL DATA:  [place] at home, fever  EXAM: CT HEAD WITHOUT CONTRAST  CT CERVICAL SPINE WITHOUT CONTRAST  TECHNIQUE: Multidetector CT imaging of the head and cervical spine was performed following the standard protocol without intravenous contrast. Multiplanar CT image reconstructions of the cervical spine were also generated.  COMPARISON:  None.  FINDINGS: CT HEAD FINDINGS  Brain: No acute territorial infarction, hemorrhage or intracranial mass. Mild atrophy and small vessel ischemic changes of the white matter. Punctate calcification with in the pons without associated mass effect.  Vascular: No hyperdense vessel.  Carotid vascular calcification  Skull: Normal. Negative for fracture or focal lesion.  Sinuses/Orbits: Mucosal thickening in the ethmoid sinuses  Other: None  CT CERVICAL SPINE FINDINGS  Alignment: Straightening of the cervical spine. No subluxation. Facet alignment within normal limits.  Skull base and vertebrae: No acute fracture. No primary bone lesion or focal pathologic process.  Soft tissues and spinal canal: No prevertebral fluid or swelling. No visible canal hematoma.  Disc levels: Moderate degenerative changes at C4-C5 and C5-C6. Multiple level bilateral facet degenerative change.  Upper chest: Negative.  Other: None  IMPRESSION: 1. No CT evidence for acute intracranial abnormality. Atrophy and mild small vessel ischemic changes of the white matter 2. Straightening of the cervical spine with degenerative change. No acute osseous abnormality   Electronically Signed   By: Donavan Foil M.D.   On: 02/28/2019 02:38    Psychological-Psychiatric: Anxiousness Gastrointestinal: No reported gastrointestinal signs or symptoms such as vomiting or evacuating blood, reflux, heartburn, alternating  episodes of diarrhea and constipation, inflamed or scarred liver, or pancreas or irrregular and/or infrequent bowel movements Genitourinary: No reported renal or genitourinary signs or symptoms such as difficulty voiding or producing urine, peeing blood, non-functioning kidney, kidney stones, difficulty emptying the bladder, difficulty controlling the flow of urine, or chronic kidney disease Hematological: No reported hematological signs or symptoms such as prolonged bleeding, low or poor functioning platelets, bruising or bleeding easily, hereditary bleeding problems, low energy levels due to low hemoglobin or being anemic Endocrine: High blood sugar controlled without the use of insulin (NIDDM) Rheumatologic: No reported rheumatological signs and symptoms such as fatigue, joint pain, tenderness, swelling, redness, heat, stiffness, decreased range of motion, with or without associated rash Musculoskeletal: Negative for myasthenia gravis, muscular dystrophy, multiple sclerosis or malignant hyperthermia Work History: Retired  Allergies  Ms. Kama is allergic to metformin.  Laboratory Chemistry Profile   Screening Lab Results  Component Value Date   SARSCOV2NAA NEGATIVE 02/28/2019    Inflammation (CRP: Acute Phase) (ESR: Chronic Phase) Lab Results  Component Value Date   LATICACIDVEN 0.9 02/28/2019  Rheumatology No results found for: RF, ANA, LABURIC, URICUR, LYMEIGGIGMAB, LYMEABIGMQN, HLAB27                      Renal Lab Results  Component Value Date   BUN 21 04/13/2019   CREATININE 0.67 04/13/2019   BCR 21 12/05/2018   GFRAA >60 04/13/2019   GFRNONAA >60 04/13/2019                             Hepatic Lab Results  Component Value Date   AST 15 04/13/2019   ALT 14 04/13/2019   ALBUMIN 4.1 04/13/2019   ALKPHOS 62 04/13/2019   LIPASE 34 12/20/2018                        Electrolytes Lab Results  Component Value Date   NA 141 04/13/2019   K 4.0  04/13/2019   CL 107 04/13/2019   CALCIUM 9.2 04/13/2019   MG 2.0 09/30/2015                        Neuropathy Lab Results  Component Value Date   VITAMINB12 1,861 (H) 05/18/2018   HGBA1C 5.6 02/28/2019                        CNS No results found for: COLORCSF, APPEARCSF, RBCCOUNTCSF, WBCCSF, POLYSCSF, LYMPHSCSF, EOSCSF, PROTEINCSF, GLUCCSF, JCVIRUS, CSFOLI, IGGCSF, LABACHR, ACETBL                      Bone Lab Results  Component Value Date   VD25OH 19.0 (L) 02/02/2016   VD125OH2TOT 63 01/27/2015   [id number] 61 01/27/2015   [id number] <10 01/27/2015                         Coagulation Lab Results  Component Value Date   INR 1.0 02/28/2019   LABPROT 13.1 02/28/2019   PLT 224 04/13/2019                        Cardiovascular Lab Results  Component Value Date   CKTOTAL 49 02/28/2019   TROPONINI <0.03 12/20/2018   HGB 13.0 04/13/2019   HCT 39.2 04/13/2019                         ID Lab Results  Component Value Date   [place] NEGATIVE 02/28/2019    Cancer No results found for: CEA, CA125, LABCA2                      Endocrine Lab Results  Component Value Date   TSH 0.739 02/28/2019                        Note: Lab results reviewed.  [place]  Drug: Ms. Waldman  reports no history of drug use. Alcohol:  reports no history of alcohol use. Tobacco:  reports that she has quit smoking. Her smoking use included cigarettes. She has a 5.00 pack-year smoking history. She has never used smokeless tobacco. Medical:  has a past medical history of Allergy, Anxiety, Avitaminosis D (11/25/2014), Environmental and seasonal allergies (05/30/2015), HLD (hyperlipidemia) (08/20/2011), Major depressive disorder with single episode, in partial remission ([place]) (11/25/2014), Psoriasis (11/25/2014), Shingles (11/2018), Type  2 diabetes mellitus with hyperosmolarity without coma, without long-term current use of insulin ([place]) (09/30/2015), and Unspecified septicemia(038.9) ([place])  (02/28/2019). Family: family history includes Cancer in her mother; Diabetes in her son; Heart disease in her father.  Past Surgical History:  Procedure Laterality Date  . BREAST BIOPSY Left    neg-bx/clip  . BREAST CYST EXCISION Left    neg  . EYE SURGERY Bilateral    cataracts  . PARTIAL HYSTERECTOMY     one ovary remains   Active Ambulatory Problems    Diagnosis Date Noted  . Major depressive disorder with single episode, in partial remission ([place]) 11/25/2014  . Avitaminosis D 11/25/2014  . Psoriasis 11/25/2014  . Hyperlipidemia associated with type 2 diabetes mellitus ([place]) 08/20/2011  . Hip pain, chronic 01/27/2015  . Environmental and seasonal allergies 05/30/2015  . Type II diabetes mellitus with complication ([place]) 35/00/9381  . Pernicious anemia 12/29/2017  . Arthralgia of multiple sites 05/18/2018  . Primary osteoarthritis of both hands 08/16/2018  . Anxiety disorder 11/16/2018  . Spinal stenosis of lumbar region at multiple levels 03/16/2019  . Post herpetic neuralgia 03/20/2019  . Chronic pain syndrome 06/11/2019   Resolved Ambulatory Problems    Diagnosis Date Noted  . Breast screening 11/25/2014  . Diabetes mellitus, type 2 ([place]) 11/25/2014  . Type 2 diabetes mellitus ([place]) 09/01/2015  . Unspecified septicemia(038.9) ([place]) 02/28/2019   Past Medical History:  Diagnosis Date  . Allergy   . Anxiety   . HLD (hyperlipidemia) 08/20/2011  . Shingles 11/2018  . Type 2 diabetes mellitus with hyperosmolarity without coma, without long-term current use of insulin ([place]) 09/30/2015   Constitutional Exam  General appearance: Well nourished, well developed, and well hydrated. In no apparent acute distress Vitals:   06/11/19 1338  BP: 129/61  Pulse: 71  Temp: 98 F (36.7 C)  SpO2: 95%  Weight: 149 lb (67.6 kg)  Height: _0  (1.676 m)   BMI Assessment: Estimated body mass index is 24.05 kg/m as calculated from the following:   Height as of this encounter: _1   (1.676 m).   Weight as of this encounter: 149 lb (67.6 kg).  BMI interpretation table: BMI level Category Range association with higher incidence of chronic pain  <18 kg/m2 Underweight   18.5-24.9 kg/m2 Ideal body weight   25-29.9 kg/m2 Overweight Increased incidence by 20%  30-34.9 kg/m2 Obese (Class I) Increased incidence by 68%  35-39.9 kg/m2 Severe obesity (Class II) Increased incidence by 136%  >40 kg/m2 Extreme obesity (Class III) Increased incidence by 254%   Patient's current BMI Ideal Body weight  Body mass index is 24.05 kg/m. Ideal body weight: 59.3 kg (130 lb 11.7 oz) Adjusted ideal body weight: 62.6 kg (138 lb 0.6 oz)   BMI Readings from Last 4 Encounters:  06/11/19 24.05 kg/m  05/01/19 24.02 kg/m  04/13/19 23.57 kg/m  04/13/19 22.60 kg/m   Wt Readings from Last 4 Encounters:  06/11/19 149 lb (67.6 kg)  05/01/19 148 lb 12.8 oz (67.5 kg)  04/13/19 146 lb (66.2 kg)  04/13/19 140 lb (63.5 kg)  Psych/Mental status: Alert, oriented x 3 (person, place, & time)       Eyes: PERLA Respiratory: No evidence of acute respiratory distress  Cervical Spine Area Exam  Skin & Axial Inspection: No masses, redness, edema, swelling, or associated skin lesions Alignment: Symmetrical Functional ROM: Unrestricted ROM      Stability: No instability detected Muscle Tone/Strength: Functionally intact. No obvious neuro-muscular anomalies detected. Sensory (  Neurological): Unimpaired Palpation: No palpable anomalies              Upper Extremity (UE) Exam    Side: Right upper extremity  Side: Left upper extremity  Skin & Extremity Inspection: Skin color, temperature, and hair growth are WNL. No peripheral edema or cyanosis. No masses, redness, swelling, asymmetry, or associated skin lesions. No contractures.  Skin & Extremity Inspection: Skin color, temperature, and hair growth are WNL. No peripheral edema or cyanosis. No masses, redness, swelling, asymmetry, or associated skin lesions.  No contractures.  Functional ROM: Unrestricted ROM          Functional ROM: Unrestricted ROM          Muscle Tone/Strength: Functionally intact. No obvious neuro-muscular anomalies detected.  Muscle Tone/Strength: Functionally intact. No obvious neuro-muscular anomalies detected.  Sensory (Neurological): Unimpaired          Sensory (Neurological): Unimpaired          Palpation: No palpable anomalies              Palpation: No palpable anomalies              Provocative Test(s):  Phalen's test: deferred Tinel's test: deferred Apley's scratch test (touch opposite shoulder):  Action 1 (Across chest): deferred Action 2 (Overhead): deferred Action 3 (LB reach): deferred   Provocative Test(s):  Phalen's test: deferred Tinel's test: deferred Apley's scratch test (touch opposite shoulder):  Action 1 (Across chest): deferred Action 2 (Overhead): deferred Action 3 (LB reach): deferred    Lumbar Spine Area Exam  Skin & Axial Inspection: healing lesions, red, not scaly Alignment: Symmetrical Functional ROM: Unrestricted ROM       Stability: No instability detected Muscle Tone/Strength: Functionally intact. No obvious neuro-muscular anomalies detected. Sensory (Neurological): Neuropathic pain pattern left lumbar region. No allodynia noted. Palpation: No palpable anomalies         Gait & Posture Assessment  Ambulation: Patient came in today in a wheel chair Gait: Significantly limited. Dependent on assistive device to ambulate Posture: Difficulty standing up straight, due to pain   Lower Extremity Exam    Side: Right lower extremity  Side: Left lower extremity  Stability: No instability observed          Stability: No instability observed          Skin & Extremity Inspection: Skin color, temperature, and hair growth are WNL. No peripheral edema or cyanosis. No masses, redness, swelling, asymmetry, or associated skin lesions. No contractures.  Skin & Extremity Inspection: Skin color,  temperature, and hair growth are WNL. No peripheral edema or cyanosis. No masses, redness, swelling, asymmetry, or associated skin lesions. No contractures.  Functional ROM: Decreased ROM for hip and knee joints          Functional ROM: Decreased ROM for hip and knee joints          Muscle Tone/Strength: Functionally intact. No obvious neuro-muscular anomalies detected.  Muscle Tone/Strength: Functionally intact. No obvious neuro-muscular anomalies detected.  Sensory (Neurological): Arthropathic arthralgia        Sensory (Neurological): Arthropathic arthralgia        DTR: Patellar: 0: absent Achilles: 0: absent Plantar: deferred today  DTR: Patellar: 0: absent Achilles: 0: absent Plantar: deferred today  Palpation: No palpable anomalies  Palpation: No palpable anomalies   Assessment  Primary Diagnosis & Pertinent Problem List: The primary encounter diagnosis was Post herpetic neuralgia (left lumbar and left groin shingles 11/2018). Diagnoses of Spinal stenosis of lumbar  region at multiple levels, Arthralgia of multiple sites, Chronic pain of right hip, Chronic pain syndrome, and Post herpetic neuralgia were also pertinent to this visit.  Visit Diagnosis (New problems to examiner): 1. Post herpetic neuralgia (left lumbar and left groin shingles 11/2018)   2. Spinal stenosis of lumbar region at multiple levels   3. Arthralgia of multiple sites   4. Chronic pain of right hip   5. Chronic pain syndrome   6. Post herpetic neuralgia    General Recommendations: The pain condition that the patient suffers from is best treated with a multidisciplinary approach that involves an increase in physical activity to prevent de-conditioning and worsening of the pain cycle, as well as psychological counseling (formal and/or informal) to address the co-morbid psychological affects of pain. Treatment will often involve judicious use of pain medications and interventional procedures to decrease the pain,  allowing the patient to participate in the physical activity that will ultimately produce long-lasting pain reductions. The goal of the multidisciplinary approach is to return the patient to a higher level of overall function and to restore their ability to perform activities of daily living.  Plan of Care (Initial workup plan)  Note: Ms. Hardiman was reminded that as per protocol, today's visit has been an evaluation only. We have not taken over the patient's controlled substance management.  Postherpetic neuralgia: Had extensive discussion with the patient regarding evidence-based strategies to manage her postherpetic neuralgia.  Patient is on Lyrica 100 mg 3 times daily which she states is impacting her memory and is also causing sedation.  I informed her to decrease to 100 mg twice daily.  Patient is understanding.  Furthermore patient finds benefit with as needed hydrocodone.  She takes half tablet to 1 tablet daily.  She is being referred by her PCP here for continuation of her chronic opioid therapy.  At this point, I do not find anything concerning or a red flag in her previous history to warrant a psychiatric evaluation.  We will complete a urine toxicology screen.  At next visit, can consider taking patient's hydrocodone over.   Lab Orders     UDS (Comprehensive-24) ([facility]) ([facility]) (New Pt.)   Pharmacological management options:  Opioid Analgesics: The patient was informed that there is no guarantee that she would be a candidate for opioid analgesics. The decision will be made following CDC guidelines. This decision will be based on the results of diagnostic studies, as well as Ms. Dunstan's risk profile.   Membrane stabilizer: Decrease Lyrica from 100 3 times daily to 100 twice daily.  Muscle relaxant: Not indicated  NSAID: Adequate regimen  Other analgesic(s): To be determined at a later time consider topicals   Provider-requested follow-up: Return in about 2 weeks (around 06/25/2019)  for Medication Management, virtual.  Future Appointments  Date Time Provider [place]  06/26/2019  3:00 PM Gillis Santa, MD [facility] None    Primary Care Physician: Glean Hess, MD Location: [facility] Note by: Gillis Santa, MD Date: 06/11/2019; Time: 3:50 PM  Note: This dictation was prepared with Dragon dictation. Any transcriptional errors that may result from this process are unintentional.

## 2019-06-12 LAB — COMPLIANCE DRUG ANALYSIS, UR

## 2019-06-13 ENCOUNTER — Telehealth: Payer: Self-pay

## 2019-06-13 NOTE — Telephone Encounter (Signed)
She should stop the glimepiride but continue Januvia.  Should be due for a DM recheck in the next few months - be sure to schedule if not already done.

## 2019-06-13 NOTE — Telephone Encounter (Signed)
Tried calling pt twice and continuing to get a busy signal. Will try again tomorrow.

## 2019-06-13 NOTE — Telephone Encounter (Signed)
Pt called to let you know she seen pain management on Monday of this week and the visit went well.  She is calling because she is having issues with her blood sugar dropping. This happens 2-3 times weekly now and it causes her to feel very weak. Is there any changes we should do in medication. Patient requesting advice.  Benedict Needy, CMA

## 2019-06-14 NOTE — Telephone Encounter (Signed)
Tried calling pt again- phone line busy.

## 2019-06-15 ENCOUNTER — Other Ambulatory Visit: Payer: Self-pay

## 2019-06-15 NOTE — Telephone Encounter (Signed)
Patient informed and scheduled for 2 month follow up for DM. Will stop glimepiride and continue Januvia.

## 2019-06-25 ENCOUNTER — Encounter: Payer: Self-pay | Admitting: Student in an Organized Health Care Education/Training Program

## 2019-06-26 ENCOUNTER — Ambulatory Visit
Payer: Medicare Other | Attending: Student in an Organized Health Care Education/Training Program | Admitting: Student in an Organized Health Care Education/Training Program

## 2019-06-26 ENCOUNTER — Other Ambulatory Visit: Payer: Self-pay

## 2019-06-26 ENCOUNTER — Encounter: Payer: Self-pay | Admitting: Student in an Organized Health Care Education/Training Program

## 2019-06-26 DIAGNOSIS — M2550 Pain in unspecified joint: Secondary | ICD-10-CM | POA: Diagnosis not present

## 2019-06-26 DIAGNOSIS — M25551 Pain in right hip: Secondary | ICD-10-CM

## 2019-06-26 DIAGNOSIS — G894 Chronic pain syndrome: Secondary | ICD-10-CM

## 2019-06-26 DIAGNOSIS — M48061 Spinal stenosis, lumbar region without neurogenic claudication: Secondary | ICD-10-CM | POA: Diagnosis not present

## 2019-06-26 DIAGNOSIS — B0229 Other postherpetic nervous system involvement: Secondary | ICD-10-CM

## 2019-06-26 DIAGNOSIS — G8929 Other chronic pain: Secondary | ICD-10-CM

## 2019-06-26 MED ORDER — HYDROCODONE-ACETAMINOPHEN 5-325 MG PO TABS: 0.5000 | ORAL_TABLET | Freq: Two times a day (BID) | ORAL | 0 refills | Status: AC | PRN

## 2019-06-26 MED ORDER — HYDROCODONE-ACETAMINOPHEN 5-325 MG PO TABS: 0.5000 | ORAL_TABLET | Freq: Two times a day (BID) | ORAL | 0 refills | Status: DC | PRN

## 2019-06-26 NOTE — Progress Notes (Signed)
Patient's Name: Jordan Montoya  MRN: [id number]  Referring Provider: Glean Hess, MD  DOB: 03/12/1934  PCP: Jordan Hess, MD  DOS: 06/26/2019  Note by: Jordan Santa, MD  Service setting: Virtual Visit (Telephone)  Attending: Gillis Santa, MD  Location: Telephone Encounter  Specialty: Interventional Pain Management  Patient type: Established   Pain Management Virtual Encounter Note - Virtual Visit via Telephone Telehealth (real-time audio visits between healthcare provider and patient).   Patient's Phone No.:  [phone] (home); There is no such number on file (mobile).; (Preferred) [phone] No e-mail address on record  [id number] [place] [id number]: [phone]: [phone]   Pre-screening note:  Our staff contacted Ms. HSpazianiand offered her an "in person", "face-to-face" appointment versus a telephone encounter. She indicated preferring the telephone encounter, at this time.   Primary Reason(s) for Virtual Visit: Encounter for evaluation before starting new chronic pain management plan of care (Level of risk: moderate) COVID-19*  Social distancing based on CDC ans AMA recommendations.    I contacted Jordan Montoya 06/26/2019 via telephone.      I clearly identified myself as BGillis Santa MD. I verified that I was speaking with the correct person using two identifiers (Name: [name] and date of birth: 209/04/1934.  Advanced Informed Consent I sought verbal advanced consent from Jordan Adefor virtual visit interactions. I informed [name] possible security and privacy concerns, risks, and limitations associated with providing "not-in-person" medical evaluation and management services. I also informed Ms. HDunsworthof the availability of "in-person" appointments. Finally, I informed her that there would be a charge for the virtual visit and that she could be   personally, fully or partially, financially responsible for it. Ms. HMcmullenexpressed understanding and agreed to proceed.   Historic Elements   Ms. GEvan Osburnis a 85y.o. year old, female patient evaluated today after her last encounter by our practice on 06/11/2019. Jordan Montoya has a past medical history of Allergy, Anxiety, Avitaminosis D (11/25/2014), Environmental and seasonal allergies (05/30/2015), HLD (hyperlipidemia) (08/20/2011), Major depressive disorder with single episode, in partial remission (HSilver Montoya (11/25/2014), Psoriasis (11/25/2014), Shingles (11/2018), Type 2 diabetes mellitus with hyperosmolarity without coma, without long-term current use of insulin (HAuberry (09/30/2015), and Unspecified septicemia(038.9) (Montoya (02/28/2019). She also  has a past surgical history that includes Partial hysterectomy; Eye surgery (Bilateral); Breast biopsy (Left); and Breast cyst excision (Left). Ms. HSantizohas a current medication list which includes the following prescription(s): acetaminophen, alprazolam, vitamin c, aspirin, cosentyx sensoready (300 mg), docusate sodium, hydrocodone-acetaminophen, hydrocodone-acetaminophen, ibuprofen, januvia, meloxicam, metoprolol succinate, naproxen sodium, pregabalin, vitamin b-12, and gabapentin. She  reports that she has quit smoking. Her smoking use included cigarettes. She has a 5.00 pack-year smoking history. She has never used smokeless tobacco. She reports that she does not drink alcohol or use drugs. Ms. HLovelessis allergic to metformin.   HPI   -2nd visit today, UDS appropriate below. -Tried decreasing Lyrica to 100 mg BID which did not have much impact on her memory/sedation so she is considering going back up to 100 mg TID -Utox appropriate as below, utilizing Hydrocodone 0.5 mg BID prn, will take over as below.    Controlled Substance Pharmacotherapy Assessment REMS (Risk Evaluation and Mitigation Strategy)  Analgesic:  Status:  Final result Visible to  patient:  No (not released) Next appt:  08/24/2019 at 10:00 AM  in Internal Medicine Jordan Maidens, MD) Dx:  Chronic pain syndrome Component 2wk ago  Summary Note   Comment: ====================================================================  Compliance Drug Analysis, Ur  ====================================================================  Test               Result    Flag    Units  Drug Present   Norhydrocodone         478           ng/mg creat   Norhydrocodone is an expected metabolite of hydrocodone.   Ephedrine/Pseudoephedrine   PRESENT   Phenylpropanolamine      PRESENT   Source of ephedrine/pseudoephedrine is most commonly pseudoephedrine   in over-the-counter or prescription cold and allergy medications.   Phenylpropanolamine is an expected metabolite of   ephedrine/pseudoephedrine.   Pregabalin           PRESENT   Acetaminophen         PRESENT   Ibuprofen           PRESENT   Lidocaine           PRESENT   Metoprolol           PRESENT          Monitoring: [place] PMP: PDMP reviewed during this encounter.       Not applicable at this point since we have not taken over the patient's medication management yet. List of other Serum/Urine Drug Screening Test(s):  No results found for: AMPHSCRSER, BARBSCRSER, BENZOSCRSER, COCAINSCRSER, COCAINSCRNUR, PCPSCRSER, THCSCRSER, THCU, CANNABQUANT, [place], [place], [place], [place] List of all UDS test(s) done:  Lab Results  Component Value Date   SUMMARY Note 06/11/2019   Last UDS on record: Summary  Date Value Ref Range Status  06/11/2019 Note  Final    Comment:    ==================================================================== Compliance Drug Analysis, Ur ==================================================================== Test                             Result       Flag       Units Drug Present    Norhydrocodone                 478                     ng/mg creat    Norhydrocodone is an expected metabolite of hydrocodone.   Ephedrine/Pseudoephedrine      PRESENT   Phenylpropanolamine            PRESENT    Source of ephedrine/pseudoephedrine is most commonly pseudoephedrine    in over-the-counter or prescription cold and allergy medications.    Phenylpropanolamine is an expected metabolite of    ephedrine/pseudoephedrine.   Pregabalin                     PRESENT   Acetaminophen                  PRESENT   Ibuprofen                      PRESENT   Lidocaine                      PRESENT   Metoprolol                     PRESENT ==================================================================== Test  Result    Flag   Units      Ref Range   Creatinine              46               mg/dL      >=20 ==================================================================== Declared Medications:  Medication list was not provided. ==================================================================== For clinical consultation, please call [phone]. ====================================================================    UDS interpretation: No unexpected findings.          Medication Assessment Form: verbally reviewed with pt Treatment compliance: Treatment may start today if patient agrees with proposed plan. Evaluation of compliance is not applicable at this point Risk Assessment Profile: Aberrant behavior: See initial evaluations. None observed or detected today Comorbid factors increasing risk of overdose: See initial evaluation. No additional risks detected today Opioid risk tool (ORT):  No flowsheet data found.  ORT Scoring interpretation table:  Score <3 = Low Risk for SUD  Score between 4-7 = Moderate Risk for SUD  Score >8 = High Risk for Opioid Abuse   Risk of substance use disorder (SUD): Low  Risk Mitigation Strategies:  Patient opioid safety  counseling: Covered. Patient-Prescriber Agreement (PPA): No agreement signed. Virtual visit  Controlled substance notification to other providers: Written and sent today.  Pharmacologic Plan: Today we may be taking over the patient's pharmacological regimen. See below.             Meds   Current Outpatient Medications:  .  acetaminophen (TYLENOL) 650 MG CR tablet, Take 650 mg by mouth every 8 (eight) hours as needed for pain. , Disp: , Rfl:  .  ALPRAZolam (XANAX) 0.25 MG tablet, TAKE 1 TABLET BY MOUTH DAILY AS NEEDED FOR ANXIETY, Disp: 30 tablet, Rfl: 5 .  Ascorbic Acid (VITAMIN C) 1000 MG tablet, Take 1,000 mg by mouth daily., Disp: , Rfl:  .  aspirin 81 MG chewable tablet, Chew by mouth daily., Disp: , Rfl:  .  COSENTYX SENSOREADY 300 DOSE 150 MG/ML SOAJ, Inject 600 mg into the skin every 30 (thirty) days., Disp: , Rfl:  .  docusate sodium (COLACE) 100 MG capsule, Take 200 mg by mouth 2 (two) times daily as needed for mild constipation., Disp: , Rfl:  .  HYDROcodone-acetaminophen (NORCO/VICODIN) 5-325 MG tablet, Take 0.5 tablets by mouth 2 (two) times daily as needed for moderate pain., Disp: 30 tablet, Rfl: 0 .  [START ON 07/26/2019] HYDROcodone-acetaminophen (NORCO/VICODIN) 5-325 MG tablet, Take 0.5 tablets by mouth 2 (two) times daily as needed for moderate pain., Disp: 30 tablet, Rfl: 0 .  ibuprofen (ADVIL) 200 MG tablet, Take 400 mg by mouth every 8 (eight) hours as needed., Disp: , Rfl:  .  JANUVIA 100 MG tablet, TAKE 1 TABLET (100 MG TOTAL) BY MOUTH DAILY., Disp: 90 tablet, Rfl: 3 .  meloxicam (MOBIC) 7.5 MG tablet, Take 7.5 mg by mouth daily., Disp: , Rfl:  .  metoprolol succinate (TOPROL-XL) 25 MG 24 hr tablet, Take 12.5 mg by mouth daily. , Disp: , Rfl:  .  naproxen sodium (ALEVE) 220 MG tablet, Take 220 mg by mouth., Disp: , Rfl:  .  pregabalin (LYRICA) 100 MG capsule, Take 1 capsule (100 mg total) by mouth 3 (three) times daily., Disp: 90 capsule, Rfl: 5 .  vitamin B-12  (CYANOCOBALAMIN) 500 MCG tablet, Take 500 mcg by mouth daily., Disp: , Rfl:   Laboratory Chemistry Profile   Screening Lab Results  Component Value Date   [place] NEGATIVE 02/28/2019  Inflammation (CRP: Acute Phase) (ESR: Chronic Phase) Lab Results  Component Value Date   LATICACIDVEN 0.9 02/28/2019                         Renal Lab Results  Component Value Date   BUN 21 04/13/2019   CREATININE 0.67 04/13/2019   BCR 21 12/05/2018   GFRAA >60 04/13/2019   GFRNONAA >60 04/13/2019                             Hepatic Lab Results  Component Value Date   AST 15 04/13/2019   ALT 14 04/13/2019   ALBUMIN 4.1 04/13/2019   ALKPHOS 62 04/13/2019   LIPASE 34 12/20/2018                        Electrolytes Lab Results  Component Value Date   NA 141 04/13/2019   K 4.0 04/13/2019   CL 107 04/13/2019   CALCIUM 9.2 04/13/2019   MG 2.0 09/30/2015                        Neuropathy Lab Results  Component Value Date   VITAMINB12 1,861 (H) 05/18/2018   HGBA1C 5.6 02/28/2019                        Bone Lab Results  Component Value Date   VD25OH 19.0 (L) 02/02/2016   VD125OH2TOT 63 01/27/2015   HE5277OE4 61 01/27/2015   [id number] <10 01/27/2015                         Coagulation Lab Results  Component Value Date   INR 1.0 02/28/2019   LABPROT 13.1 02/28/2019   PLT 224 04/13/2019                        Cardiovascular Lab Results  Component Value Date   CKTOTAL 49 02/28/2019   TROPONINI <0.03 12/20/2018   HGB 13.0 04/13/2019   HCT 39.2 04/13/2019                         ID Lab Results  Component Value Date   [place] NEGATIVE 02/28/2019    Cancer No results found for: CEA, CA125, LABCA2                      Endocrine Lab Results  Component Value Date   TSH 0.739 02/28/2019                        Note: Lab results reviewed.  Recent Diagnostic Imaging Review  Cervical Imaging:  Cervical CT wo contrast:  Results for orders placed during  the hospital encounter of 02/28/19  CT Cervical Spine Wo Contrast   Narrative CLINICAL DATA:  [place] at home, fever  EXAM: CT HEAD WITHOUT CONTRAST  CT CERVICAL SPINE WITHOUT CONTRAST  TECHNIQUE: Multidetector CT imaging of the head and cervical spine was performed following the standard protocol without intravenous contrast. Multiplanar CT image reconstructions of the cervical spine were also generated.  COMPARISON:  None.  FINDINGS: CT HEAD FINDINGS  Brain: No acute territorial infarction, hemorrhage or intracranial mass. Mild atrophy and small vessel ischemic changes of the white matter. Punctate calcification  with in the pons without associated mass effect.  Vascular: No hyperdense vessel.  Carotid vascular calcification  Skull: Normal. Negative for fracture or focal lesion.  Sinuses/Orbits: Mucosal thickening in the ethmoid sinuses  Other: None  CT CERVICAL SPINE FINDINGS  Alignment: Straightening of the cervical spine. No subluxation. Facet alignment within normal limits.  Skull base and vertebrae: No acute fracture. No primary bone lesion or focal pathologic process.  Soft tissues and spinal canal: No prevertebral fluid or swelling. No visible canal hematoma.  Disc levels: Moderate degenerative changes at C4-C5 and C5-C6. Multiple level bilateral facet degenerative change.  Upper chest: Negative.  Other: None  IMPRESSION: 1. No CT evidence for acute intracranial abnormality. Atrophy and mild small vessel ischemic changes of the white matter 2. Straightening of the cervical spine with degenerative change. No acute osseous abnormality   Electronically Signed   By: Donavan Foil M.D.   On: 02/28/2019 02:38    Results for orders placed during the hospital encounter of 02/28/19  CT LUMBAR SPINE WO CONTRAST   Narrative CLINICAL DATA:  Low back pain.  Recent falls.  EXAM: CT LUMBAR SPINE WITHOUT CONTRAST  TECHNIQUE: Multidetector CT imaging of the  lumbar spine was performed without intravenous contrast administration. Multiplanar CT image reconstructions were also generated.  COMPARISON:  None.  FINDINGS: Segmentation: Normal  Alignment: Normal  Vertebrae: Negative for fracture or mass.  Paraspinal and other soft tissues: Negative for paraspinous mass or adenopathy. Large calcified gallstones. Mild atherosclerotic aorta.  Disc levels: L1-2: Mild facet degeneration.  Negative for stenosis  L2-3: Mild disc bulging and mild facet degeneration. Negative for stenosis  L3-4: Mild disc bulging and moderate facet degeneration. Mild spinal stenosis.  L4-5: Disc degeneration with gas in the disc space. Diffuse disc bulging with endplate spurring. Advanced facet arthrosis bilaterally causing moderate spinal stenosis. Moderate subarticular stenosis bilaterally.  L5-S1: Severe facet degeneration with bony overgrowth left causing marked subarticular stenosis on the left. Expected impingement left S1 nerve root. Moderate right-sided facet arthrosis with mild to moderate right subarticular stenosis.  IMPRESSION: Mild spinal stenosis L3-4  Moderate spinal stenosis L4-5 with moderate subarticular stenosis bilaterally  Severe facet degeneration on the left with severe left subarticular stenosis. Mild to moderate subarticular stenosis on the right.  Large calcified gallstone   Electronically Signed   By: Franchot Gallo M.D.   On: 03/01/2019 14:37     Complexity Note: Imaging results reviewed. Results shared with Ms. Amedeo Plenty, using Layman's terms.                         Assessment  The primary encounter diagnosis was Post herpetic neuralgia (left lumbar and left groin shingles 11/2018). Diagnoses of Spinal stenosis of lumbar region at multiple levels, Arthralgia of multiple sites, Chronic pain of right hip, and Chronic pain syndrome were also pertinent to this visit.  Plan of Care  I have changed Kinzee J. Weismann's  HYDROcodone-acetaminophen. I am also having her start on HYDROcodone-acetaminophen. Additionally, I am having her maintain her Cosentyx Sensoready (300 MG), metoprolol succinate, acetaminophen, Januvia, ALPRAZolam, naproxen sodium, vitamin C, vitamin B-12, pregabalin, ibuprofen, docusate sodium, aspirin, and meloxicam.  1. Continue Lyrica 100 mg BID ok to go back to TID if she prefers since patient did not experience any improvement in memory/sedation with dose reduction 2. Rx for Hydrocodone below, Rx provided for 2 months 3. Continue APAP 650 mg q8 prns and Ibuprofen 400 mg BID prn  Pharmacotherapy (Medications Ordered): Meds ordered this encounter  Medications  . HYDROcodone-acetaminophen (NORCO/VICODIN) 5-325 MG tablet    Sig: Take 0.5 tablets by mouth 2 (two) times daily as needed for moderate pain.    Dispense:  30 tablet    Refill:  0  . HYDROcodone-acetaminophen (NORCO/VICODIN) 5-325 MG tablet    Sig: Take 0.5 tablets by mouth 2 (two) times daily as needed for moderate pain.    Dispense:  30 tablet    Refill:  0    Total duration of non-face-to-face encounter: 25 minutes.  Follow-up plan:   Return in about 8 weeks (around 08/21/2019) for Medication Management, virtual.    Recent Visits Date Type Provider Dept  06/11/19 Office Visit Jordan Santa, MD [facility]  Showing recent visits within past 90 days and meeting all other requirements   Today's Visits Date Type Provider Dept  06/26/19 Office Visit Jordan Santa, MD [facility]  Showing today's visits and meeting all other requirements   Future Appointments No visits were found meeting these conditions.  Showing future appointments within next 90 days and meeting all other requirements   Primary Care Physician: Jordan Hess, MD Location: Telephone Virtual Visit Note by: Jordan Santa, MD Date: 06/26/2019; Time: 3:47 PM  Note: This dictation was prepared with Dragon dictation. Any  transcriptional errors that may result from this process are unintentional.  Disclaimer:  * Given the special circumstances of the COVID-19 pandemic, the federal government has announced that the Office for Civil Rights (OCR) will exercise its enforcement discretion and will not impose penalties on physicians using telehealth in the event of noncompliance with regulatory requirements under the [place] (HIPAA) in connection with the good faith provision of telehealth during the DKCCQ-19 national public health emergency. ([place])

## 2019-07-03 ENCOUNTER — Telehealth: Payer: Self-pay

## 2019-07-03 NOTE — Telephone Encounter (Signed)
Patient informed. She said she is going to try acupuncture.

## 2019-07-03 NOTE — Telephone Encounter (Signed)
She needs to discuss all of these symptoms with her pain MD.  It is now in their hands as this is their specialty.

## 2019-07-03 NOTE — Telephone Encounter (Signed)
Pt called saying she is having a lot of issues with the pains and numbness in her Rt leg. She said she is going to continue to see her pain doctor for medication but is there any other treatment for this spinal stenosis causing her leg pain/numbness.  Please advise?

## 2019-07-04 ENCOUNTER — Telehealth: Payer: Self-pay | Admitting: *Deleted

## 2019-07-04 NOTE — Telephone Encounter (Signed)
Noted. Thanks.

## 2019-07-09 ENCOUNTER — Telehealth: Payer: Self-pay | Admitting: *Deleted

## 2019-07-09 NOTE — Telephone Encounter (Signed)
Hold off on MRI at the time being. She has moderate-severe lumbar spinal stenosis and neurogenic claudication and facet disease. This was present on her CT lumbar spine that she did in January. Continue to monitor.

## 2019-07-09 NOTE — Telephone Encounter (Signed)
Takes Hydrocodone as prescribed by you. Takes Lyrica 3 times per day. Takes Ibuprofen 400 mg two times per day. Does not take Tylenol as recommended because has taken it before and it did not help.  Also asking for MRI. Cannot specify an area of pain to be examined by MRI, states pain "is from my waist down." Please advise.

## 2019-07-10 NOTE — Telephone Encounter (Signed)
Patient has called again today. She called yesterday, see note.

## 2019-07-10 NOTE — Telephone Encounter (Signed)
Agrees to come for Toradol/Norflex injection tomorrow.  Please put pt on the schedule. I told her to come tomorrow afternoon.

## 2019-07-10 NOTE — Telephone Encounter (Signed)
Pt is having unbearable pain in her right leg. Please return her call.                                                                              Thanks

## 2019-07-11 ENCOUNTER — Ambulatory Visit: Payer: Medicare Other | Admitting: Student in an Organized Health Care Education/Training Program

## 2019-07-20 ENCOUNTER — Other Ambulatory Visit: Payer: Self-pay | Admitting: Internal Medicine

## 2019-07-20 DIAGNOSIS — F32A Depression, unspecified: Secondary | ICD-10-CM

## 2019-07-20 DIAGNOSIS — F329 Major depressive disorder, single episode, unspecified: Secondary | ICD-10-CM

## 2019-07-23 ENCOUNTER — Telehealth: Payer: Self-pay | Admitting: *Deleted

## 2019-08-06 DIAGNOSIS — Z01818 Encounter for other preprocedural examination: Secondary | ICD-10-CM | POA: Diagnosis not present

## 2019-08-06 DIAGNOSIS — M5416 Radiculopathy, lumbar region: Secondary | ICD-10-CM | POA: Diagnosis not present

## 2019-08-06 DIAGNOSIS — M48061 Spinal stenosis, lumbar region without neurogenic claudication: Secondary | ICD-10-CM | POA: Diagnosis not present

## 2019-08-06 DIAGNOSIS — B0229 Other postherpetic nervous system involvement: Secondary | ICD-10-CM | POA: Diagnosis not present

## 2019-08-07 ENCOUNTER — Telehealth: Payer: Self-pay | Admitting: Student in an Organized Health Care Education/Training Program

## 2019-08-07 NOTE — Telephone Encounter (Signed)
Patient is having epidural at [facility] office, they have told her she needs to stop ibuprofen for 3 days and she may have to up her pain meds for 3 days. She was told she needs to ok this with Dr. Cherylann Ratel. Please let patient know if this is ok to do. She wants to speak with someone about this.

## 2019-08-07 NOTE — Telephone Encounter (Signed)
Talked with patient on the phone. She is going to have an epidural with [facility] and is very upset that they told her to stop her Ibuprofren. She states she needs something for pain and is afraid that she will start hurting. The nurses at the clinic ask that she call and she if she can take a whole pill if needed for those 3 days. Patient was very upset.

## 2019-08-08 ENCOUNTER — Telehealth: Payer: Self-pay | Admitting: Student in an Organized Health Care Education/Training Program

## 2019-08-08 NOTE — Telephone Encounter (Signed)
Patient lvmail asking if she can increase her pain meds when she has to stop her ibuprofen starting Saturday for a procedure she is having? Please call patient

## 2019-08-08 NOTE — Telephone Encounter (Signed)
A message about this was sent to Dr. Cherylann Ratel by CG yesterday.

## 2019-08-13 DIAGNOSIS — M5136 Other intervertebral disc degeneration, lumbar region: Secondary | ICD-10-CM | POA: Diagnosis not present

## 2019-08-13 DIAGNOSIS — Z01818 Encounter for other preprocedural examination: Secondary | ICD-10-CM | POA: Diagnosis not present

## 2019-08-13 DIAGNOSIS — B0229 Other postherpetic nervous system involvement: Secondary | ICD-10-CM | POA: Diagnosis not present

## 2019-08-15 ENCOUNTER — Encounter: Payer: Self-pay | Admitting: Student in an Organized Health Care Education/Training Program

## 2019-08-16 ENCOUNTER — Ambulatory Visit
Payer: Medicare PPO | Attending: Student in an Organized Health Care Education/Training Program | Admitting: Student in an Organized Health Care Education/Training Program

## 2019-08-16 ENCOUNTER — Other Ambulatory Visit: Payer: Self-pay

## 2019-08-16 ENCOUNTER — Encounter: Payer: Self-pay | Admitting: Student in an Organized Health Care Education/Training Program

## 2019-08-16 DIAGNOSIS — M48061 Spinal stenosis, lumbar region without neurogenic claudication: Secondary | ICD-10-CM

## 2019-08-16 DIAGNOSIS — G894 Chronic pain syndrome: Secondary | ICD-10-CM | POA: Diagnosis not present

## 2019-08-16 DIAGNOSIS — B029 Zoster without complications: Secondary | ICD-10-CM | POA: Diagnosis not present

## 2019-08-16 DIAGNOSIS — B0229 Other postherpetic nervous system involvement: Secondary | ICD-10-CM

## 2019-08-16 DIAGNOSIS — M25551 Pain in right hip: Secondary | ICD-10-CM | POA: Diagnosis not present

## 2019-08-16 DIAGNOSIS — G8929 Other chronic pain: Secondary | ICD-10-CM

## 2019-08-16 DIAGNOSIS — M2550 Pain in unspecified joint: Secondary | ICD-10-CM

## 2019-08-16 MED ORDER — TRAMADOL-ACETAMINOPHEN 37.5-325 MG PO TABS: 1.0000 | ORAL_TABLET | Freq: Two times a day (BID) | ORAL | 0 refills | Status: AC | PRN

## 2019-08-16 MED ORDER — PREGABALIN 100 MG PO CAPS: 100.0000 mg | ORAL_CAPSULE | Freq: Two times a day (BID) | ORAL | 5 refills | Status: DC

## 2019-08-16 NOTE — Progress Notes (Signed)
Patient: Jordan Montoya  Service Category: E/M  Provider: Edward Jolly, MD  DOB: Jul 05, 1934  DOS: 08/16/2019  Location: Office  MRN: [id number]  Setting: Ambulatory outpatient  Referring Provider: Reubin Milan, MD  Type: Established Patient  Specialty: Interventional Pain Management  PCP: Reubin Milan, MD  Location: Home  Delivery: TeleHealth     Virtual Encounter - Pain Management PROVIDER NOTE: Information contained herein reflects review and annotations entered in association with encounter. Interpretation of such information and data should be left to medically-trained personnel. Information provided to patient can be located elsewhere in the medical record under "Patient Instructions". Document created using STT-dictation technology, any transcriptional errors that may result from process are unintentional.    Contact & Pharmacy Preferred: [phone] Home: [phone] (home) Mobile: There is no such number on file (mobile). E-mail: No e-mail address on record  [facility] [phone] [place], [place] - [address] [place] [address] Phone: [phone] Fax: [phone]   Pre-screening  Jordan Montoya offered "in-person" vs "virtual" encounter. She indicated preferring virtual for this encounter.   Reason COVID-19*  Social distancing based on CDC and AMA recommendations.   I contacted Jordan Montoya on 08/16/2019 via telephone.      I clearly identified myself as Edward Jolly, MD. I verified that I was speaking with the correct person using two identifiers (Name: Jordan Montoya, and date of birth: Mar 10, 1934).  This visit was completed via telephone due to the restrictions of the COVID-19 pandemic. All issues as above were discussed and addressed but no physical exam was performed. If it was felt that the patient should be evaluated in the office, they were directed there. The patient verbally consented to this visit. Patient was unable to complete an  audio/visual visit due to Technical difficulties and/or Lack of internet. Due to the catastrophic nature of the COVID-19 pandemic, this visit was done through audio contact only.  Location of the patient: home address (see Epic for details)  Location of the provider: office  Consent I sought verbal advanced consent from Jordan Montoya for virtual visit interactions. I informed Jordan Montoya of possible security and privacy concerns, risks, and limitations associated with providing "not-in-person" medical evaluation and management services. I also informed Jordan Montoya of the availability of "in-person" appointments. Finally, I informed her that there would be a charge for the virtual visit and that she could be  personally, fully or partially, financially responsible for it. Jordan Montoya expressed understanding and agreed to proceed.   Historic Elements   Jordan Montoya is a 86 y.o. year old, female patient evaluated today after her last encounter by our practice on 08/08/2019. Jordan Montoya  has a past medical history of Allergy, Anxiety, Avitaminosis D (11/25/2014), Environmental and seasonal allergies (05/30/2015), HLD (hyperlipidemia) (08/20/2011), Major depressive disorder with single episode, in partial remission (HCC) (11/25/2014), Psoriasis (11/25/2014), Shingles (11/2018), Type 2 diabetes mellitus with hyperosmolarity without coma, without long-term current use of insulin (HCC) (09/30/2015), and Unspecified septicemia(038.9) (HCC) (02/28/2019). She also  has a past surgical history that includes Partial hysterectomy; Eye surgery (Bilateral); Breast biopsy (Left); and Breast cyst excision (Left). Jordan Montoya has a current medication list which includes the following prescription(s): acetaminophen, alprazolam, vitamin c, aspirin, cosentyx sensoready (300 mg), docusate sodium, ibuprofen, januvia, meloxicam, metoprolol succinate, naproxen sodium, pregabalin, tramadol-acetaminophen, vitamin b-12, and  [DISCONTINUED] gabapentin. She  reports that she has quit smoking. Her smoking use included cigarettes. She has a 5.00  pack-year smoking history. She has never used smokeless tobacco. She reports that she does not drink alcohol or use drugs. Jordan Montoya is allergic to metformin.   HPI  Today, she is being contacted for medication management.   No benefit with Hydrocodone, pt prefers to discontinue. Would like to revisit Tramadol which she took in the past Did see Jordan Montoya PM&R for L2-3 ESI, considering SCS trial   Pharmacotherapy Assessment  Analgesic: Dc Norco, start Tramadol as below BID PRN  Monitoring: Pharmacotherapy: No side-effects or adverse reactions reported. [place] PMP: PDMP not reviewed this encounter.       Compliance: No problems identified. Effectiveness: Clinically acceptable. Plan: Refer to "POC".  UDS:  Summary  Date Value Ref Range Status  06/11/2019 Note  Final    Comment:    ==================================================================== Compliance Drug Analysis, Ur ==================================================================== Test                             Result       Flag       Units Drug Present   Norhydrocodone                 478                     ng/mg creat    Norhydrocodone is an expected metabolite of hydrocodone.   Ephedrine/Pseudoephedrine      PRESENT   Phenylpropanolamine            PRESENT    Source of ephedrine/pseudoephedrine is most commonly pseudoephedrine    in over-the-counter or prescription cold and allergy medications.    Phenylpropanolamine is an expected metabolite of    ephedrine/pseudoephedrine.   Pregabalin                     PRESENT   Acetaminophen                  PRESENT   Ibuprofen                      PRESENT   Lidocaine                      PRESENT   Metoprolol                     PRESENT ==================================================================== Test                      Result    Flag   Units       Ref Range   Creatinine              46               mg/dL      >=82 ==================================================================== Declared Medications:  Medication list was not provided. ==================================================================== For clinical consultation, please call [phone]. ====================================================================    Laboratory Chemistry Profile (12 mo)  Renal: 12/05/2018: BUN/Creatinine Ratio 21 04/13/2019: BUN 21; Creatinine, Ser 0.67  Lab Results  Component Value Date   GFRAA >60 04/13/2019   GFRNONAA >60 04/13/2019   Hepatic: 04/13/2019: Albumin 4.1 Lab Results  Component Value Date   AST 15 04/13/2019   ALT 14 04/13/2019   Other: No results found for requested labs within last 8760 hours. Note: Above Lab results reviewed.  Imaging  MM 3D SCREEN BREAST BILATERAL  CLINICAL DATA:  Screening.  EXAM: DIGITAL SCREENING BILATERAL MAMMOGRAM WITH TOMO AND CAD  COMPARISON:  None.  ACR Breast Density Category a: The breast tissue is almost entirely fatty.  FINDINGS: There are no findings suspicious for malignancy. Images were processed with CAD.  IMPRESSION: No mammographic evidence of malignancy. A result letter of this screening mammogram will be mailed directly to the patient.  RECOMMENDATION: Screening mammogram in one year. (Code:[id number]-B-01Y)  BI-RADS CATEGORY  1: Negative.  Electronically Signed   By: Dorise Bullion III M.D   On: 05/30/2019 13:54   Assessment  The primary encounter diagnosis was Post herpetic neuralgia (left lumbar and left groin shingles 11/2018). Diagnoses of Spinal stenosis of lumbar region at multiple levels, Arthralgia of multiple sites, Chronic pain of right hip, Chronic pain syndrome, and Herpes zoster without complication were also pertinent to this visit.  Plan of Care   I have discontinued Marin Comment. Nannini's HYDROcodone-acetaminophen. I have also changed her  traMADol-acetaminophen and pregabalin. Additionally, I am having her maintain her Cosentyx Sensoready (300 MG), metoprolol succinate, acetaminophen, Januvia, ALPRAZolam, naproxen sodium, vitamin C, vitamin B-12, ibuprofen, docusate sodium, aspirin, and meloxicam.  Pharmacotherapy (Medications Ordered): Meds ordered this encounter  Medications  . traMADol-acetaminophen (ULTRACET) 37.5-325 MG tablet    Sig: Take 1 tablet by mouth every 12 (twelve) hours as needed. For chronic pain syndrome    Dispense:  60 tablet    Refill:  0  . pregabalin (LYRICA) 100 MG capsule    Sig: Take 1 capsule (100 mg total) by mouth 2 (two) times daily.    Dispense:  90 capsule    Refill:  5    Follow-up plan:   Return if symptoms worsen or fail to improve.    Recent Visits Date Type Provider Dept  06/26/19 Office Visit Gillis Santa, MD [facility]  06/11/19 Office Visit Gillis Santa, MD [facility]  Showing recent visits within past 90 days and meeting all other requirements   Today's Visits Date Type Provider Dept  08/16/19 Office Visit Gillis Santa, MD [facility]  Showing today's visits and meeting all other requirements   Future Appointments No visits were found meeting these conditions.  Showing future appointments within next 90 days and meeting all other requirements   I discussed the assessment and treatment plan with the patient. The patient was provided an opportunity to ask questions and all were answered. The patient agreed with the plan and demonstrated an understanding of the instructions.  Patient advised to call back or seek an in-person evaluation if the symptoms or condition worsens.  Total duration of non-face-to-face encounter: 30 minutes.  Note by: Gillis Santa, MD Date: 08/16/2019; Time: 1:56 PM

## 2019-08-22 DIAGNOSIS — Z79899 Other long term (current) drug therapy: Secondary | ICD-10-CM | POA: Diagnosis not present

## 2019-08-22 DIAGNOSIS — L400 Psoriasis vulgaris: Secondary | ICD-10-CM | POA: Diagnosis not present

## 2019-08-24 ENCOUNTER — Ambulatory Visit: Payer: Self-pay | Admitting: Internal Medicine

## 2019-08-28 ENCOUNTER — Encounter: Payer: Self-pay | Admitting: Internal Medicine

## 2019-08-28 ENCOUNTER — Ambulatory Visit (INDEPENDENT_AMBULATORY_CARE_PROVIDER_SITE_OTHER): Payer: Medicare PPO | Admitting: Internal Medicine

## 2019-08-28 ENCOUNTER — Other Ambulatory Visit: Payer: Self-pay

## 2019-08-28 VITALS — Ht 66.0 in | Wt 149.0 lb

## 2019-08-28 DIAGNOSIS — E113299 Type 2 diabetes mellitus with mild nonproliferative diabetic retinopathy without macular edema, unspecified eye: Secondary | ICD-10-CM | POA: Diagnosis not present

## 2019-08-28 DIAGNOSIS — R350 Frequency of micturition: Secondary | ICD-10-CM

## 2019-08-28 DIAGNOSIS — F324 Major depressive disorder, single episode, in partial remission: Secondary | ICD-10-CM | POA: Diagnosis not present

## 2019-08-28 NOTE — Progress Notes (Signed)
Date:  08/28/2019   Name:  Jordan Montoya   DOB:  [birth year]   MRN:  [id number]  I connected with this patient, Jordan Montoya, by telephone at the patient's home.  I verified that I am speaking with the correct person using two identifiers. This visit was conducted via telephone due to the Covid-19 outbreak from my office at [facility] in [place], [place]. I discussed the limitations, risks, security and privacy concerns of performing an evaluation and management service by telephone. I also discussed with the patient that there may be a patient responsible charge related to this service. The patient expressed understanding and agreed to proceed.  Chief Complaint: Urinary Tract Infection (Started 1 week. Feels like not emptying bladder. Frequency. )  Urinary Tract Infection  This is a new problem. The current episode started in the past 7 days. The problem has been unchanged. The quality of the pain is described as aching. The pain is mild. There has been no fever. Associated symptoms include frequency, hesitancy and urgency. Pertinent negatives include no chills or discharge. She has tried increased fluids for the symptoms.  Depression        This is a recurrent problem.  The problem has been resolved since onset.  Associated symptoms include no decreased concentration, no decreased interest and no appetite change.( Mild anxiety)  Past treatments include SSRIs - Selective serotonin reuptake inhibitors (was taking Paxil up until last May when it was stopped at hospital discharge). Diabetes She presents for her follow-up diabetic visit. She has type 2 diabetes mellitus. Her disease course has been stable. Symptoms are stable. Current diabetic treatments: januvia 100 mg. Her weight is stable. She is following a generally healthy diet. She monitors blood glucose at home 1-2 x per week. Her breakfast blood glucose is taken between 6-7 am. Her breakfast blood glucose range is generally  130-140 mg/dl.    Lab Results  Component Value Date   CREATININE 0.67 04/13/2019   BUN 21 04/13/2019   NA 141 04/13/2019   K 4.0 04/13/2019   CL 107 04/13/2019   CO2 26 04/13/2019   Lab Results  Component Value Date   CHOL 166 08/31/2017   HDL 58 08/31/2017   LDLCALC 64 08/31/2017   TRIG 218 (H) 08/31/2017   CHOLHDL 2.9 08/31/2017   Lab Results  Component Value Date   TSH 0.739 02/28/2019   Lab Results  Component Value Date   HGBA1C 5.6 02/28/2019     Review of Systems  Constitutional: Negative for appetite change and chills.  Genitourinary: Positive for frequency, hesitancy and urgency.  Psychiatric/Behavioral: Positive for depression. Negative for decreased concentration.    Patient Active Problem List   Diagnosis Date Noted  . Chronic pain syndrome 06/11/2019  . Post herpetic neuralgia 03/20/2019  . Spinal stenosis of lumbar region at multiple levels 03/16/2019  . Anxiety disorder 11/16/2018  . Primary osteoarthritis of both hands 08/16/2018  . Arthralgia of multiple sites 05/18/2018  . Pernicious anemia 12/29/2017  . Type II diabetes mellitus with complication ([place]) 19/41/7408  . Environmental and seasonal allergies 05/30/2015  . Hip pain, chronic 01/27/2015  . Major depressive disorder with single episode, in partial remission ([place]) 11/25/2014  . Avitaminosis D 11/25/2014  . Psoriasis 11/25/2014  . Hyperlipidemia associated with type 2 diabetes mellitus ([place]) 08/20/2011    Allergies  Allergen Reactions  . Metformin Nausea Only    Past Surgical History:  Procedure Laterality Date  . BREAST BIOPSY Left  neg-bx/clip  . BREAST CYST EXCISION Left    neg  . EYE SURGERY Bilateral    cataracts  . PARTIAL HYSTERECTOMY     one ovary remains    Social History   Tobacco Use  . Smoking status: Former Smoker    Packs/day: 0.25    Years: 20.00    Pack years: 5.00    Types: Cigarettes  . Smokeless tobacco: Never Used  Substance Use Topics  .  Alcohol use: No    Alcohol/week: 0.0 standard drinks  . Drug use: No     Medication list has been reviewed and updated.  Current Meds  Medication Sig  . acetaminophen (TYLENOL) 650 MG CR tablet Take 650 mg by mouth every 8 (eight) hours as needed for pain.   Marland Kitchen ALPRAZolam (XANAX) 0.25 MG tablet TAKE 1 TABLET BY MOUTH DAILY AS NEEDED FOR ANXIETY  . aspirin 81 MG chewable tablet Chew by mouth daily.  Lorenso Quarry SENSOREADY 300 DOSE 150 MG/ML SOAJ Inject 600 mg into the skin every 30 (thirty) days.  Marland Kitchen docusate sodium (COLACE) 100 MG capsule Take 200 mg by mouth 2 (two) times daily as needed for mild constipation.  Marland Kitchen ibuprofen (ADVIL) 200 MG tablet Take 400 mg by mouth every 8 (eight) hours as needed.  Marland Kitchen JANUVIA 100 MG tablet TAKE 1 TABLET (100 MG TOTAL) BY MOUTH DAILY.  . metoprolol succinate (TOPROL-XL) 25 MG 24 hr tablet Take 12.5 mg by mouth daily.   . naproxen sodium (ALEVE) 220 MG tablet Take 220 mg by mouth.  . pregabalin (LYRICA) 100 MG capsule Take 1 capsule (100 mg total) by mouth 2 (two) times daily.  . traMADol-acetaminophen (ULTRACET) 37.5-325 MG tablet Take 1 tablet by mouth every 12 (twelve) hours as needed. For chronic pain syndrome    PHQ 2/9 Scores 08/28/2019 05/01/2019 03/16/2019 12/05/2018  PHQ - 2 Score 0 0 5 3  PHQ- 9 Score 6 3 7 12     BP Readings from Last 3 Encounters:  06/11/19 129/61  05/01/19 124/68  04/26/19 (!) 150/60    Physical Exam Constitutional:      Appearance: Normal appearance.  Neurological:     Mental Status: She is alert.  Psychiatric:        Attention and Perception: Attention normal.        Mood and Affect: Mood normal.        Cognition and Memory: Cognition normal.     Wt Readings from Last 3 Encounters:  08/28/19 149 lb (67.6 kg)  06/11/19 149 lb (67.6 kg)  05/01/19 148 lb 12.8 oz (67.5 kg)    Ht 5\' 6"  (1.676 m)   Wt 149 lb (67.6 kg)   BMI 24.05 kg/m   Assessment and Plan: 1. Urinary frequency She will take Augmentin 875 mg  twice a day for 5 days (has antibiotics on hand)  2. Major depressive disorder with single episode, in partial remission (HCC) Clinically stable with ongoing anxiety but no depression She has no SI/HI.  She no longer takes Paxil. She will continue to take alprazolam PRN  3. 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 Tolerating medications - Januvia 100 mg - well without side effects or other concerns. She will schedule an appointment to follow up in the near future.    Partially dictated using 05/03/19. Any errors are unintentional.  , MD [facility]  [facility]  08/28/2019

## 2019-09-05 DIAGNOSIS — M48061 Spinal stenosis, lumbar region without neurogenic claudication: Secondary | ICD-10-CM | POA: Diagnosis not present

## 2019-09-05 DIAGNOSIS — B0229 Other postherpetic nervous system involvement: Secondary | ICD-10-CM | POA: Diagnosis not present

## 2019-09-05 DIAGNOSIS — M5416 Radiculopathy, lumbar region: Secondary | ICD-10-CM | POA: Diagnosis not present

## 2019-10-10 ENCOUNTER — Ambulatory Visit
Admission: EM | Admit: 2019-10-10 | Discharge: 2019-10-10 | Disposition: A | Discharge status: Home / Self Care | Payer: Medicare PPO | Attending: Family Medicine | Admitting: Family Medicine

## 2019-10-10 ENCOUNTER — Other Ambulatory Visit: Payer: Self-pay

## 2019-10-10 ENCOUNTER — Encounter: Payer: Self-pay | Admitting: Emergency Medicine

## 2019-10-10 DIAGNOSIS — N390 Urinary tract infection, site not specified: Secondary | ICD-10-CM

## 2019-10-10 LAB — GLUCOSE, CAPILLARY: Glucose-Capillary: 225 mg/dL — ABNORMAL HIGH (ref 70–99)

## 2019-10-10 LAB — URINALYSIS, COMPLETE (UACMP) WITH MICROSCOPIC
Bilirubin Urine: NEGATIVE
Glucose, UA: NEGATIVE mg/dL
Nitrite: NEGATIVE
Protein, ur: 100 mg/dL — AB
Specific Gravity, Urine: >1.03 — ABNORMAL HIGH (ref 1.005–1.030)
WBC, UA: >50 WBC/hpf (ref 0–5)
pH: 5.5 (ref 5.0–8.0)

## 2019-10-10 MED ORDER — NITROFURANTOIN MONOHYD MACRO 100 MG PO CAPS: 100.0000 mg | ORAL_CAPSULE | Freq: Two times a day (BID) | ORAL | 0 refills | Status: DC

## 2019-10-10 NOTE — Discharge Instructions (Signed)
Increase water intake

## 2019-10-10 NOTE — ED Provider Notes (Signed)
[facility]    CSN: [id number] Arrival date & time: 10/10/19  1033      History   Chief Complaint Chief Complaint  Patient presents with  . Urinary Frequency  . Urinary Urgency    HPI Jordan Montoya is a 86 y.o. female.   86 yo female with a c/o frequent urination, urgency and urinary incontinence for the past 2 days. Denies any fevers, chills, vomiting, abdominal or flank pain.    Urinary Frequency    Past Medical History:  Diagnosis Date  . Allergy   . Anxiety   . Avitaminosis D 11/25/2014  . Environmental and seasonal allergies 05/30/2015  . HLD (hyperlipidemia) 08/20/2011   Overview:  LDL goal <100 given DM2   . Major depressive disorder with single episode, in partial remission ([place]) 11/25/2014  . Psoriasis 11/25/2014   followed by Dermatology   . Shingles 11/2018  . Type 2 diabetes mellitus with hyperosmolarity without coma, without long-term current use of insulin ([place]) 09/30/2015  . Unspecified septicemia(038.9) ([place]) 02/28/2019    Patient Active Problem List   Diagnosis Date Noted  . Chronic pain syndrome 06/11/2019  . Post herpetic neuralgia 03/20/2019  . Spinal stenosis of lumbar region at multiple levels 03/16/2019  . Anxiety disorder 11/16/2018  . Primary osteoarthritis of both hands 08/16/2018  . Arthralgia of multiple sites 05/18/2018  . Pernicious anemia 12/29/2017  . Type II diabetes mellitus with complication ([place]) 43/15/4008  . Environmental and seasonal allergies 05/30/2015  . Hip pain, chronic 01/27/2015  . Major depressive disorder with single episode, in partial remission ([place]) 11/25/2014  . Avitaminosis D 11/25/2014  . Psoriasis 11/25/2014  . Hyperlipidemia associated with type 2 diabetes mellitus ([place]) 08/20/2011    Past Surgical History:  Procedure Laterality Date  . BREAST BIOPSY Left    neg-bx/clip  . BREAST CYST EXCISION Left    neg  . EYE SURGERY Bilateral    cataracts  . PARTIAL HYSTERECTOMY     one ovary  remains    OB History    Gravida  3   Para  3   Term  3   Preterm      AB      Living        SAB      TAB      Ectopic      Multiple      Live Births  1            Home Medications    Prior to Admission medications   Medication Sig Start Date End Date Taking? Authorizing Provider  acetaminophen (TYLENOL) 650 MG CR tablet Take 650 mg by mouth every 8 (eight) hours as needed for pain.    Yes [provider]  ALPRAZolam Duanne Moron) 0.25 MG tablet TAKE 1 TABLET BY MOUTH DAILY AS NEEDED FOR ANXIETY 04/11/19  Yes Glean Hess, MD  aspirin 325 MG EC tablet Take 325 mg by mouth daily.   Yes [provider]  docusate sodium (COLACE) 100 MG capsule Take 200 mg by mouth 2 (two) times daily as needed for mild constipation.   Yes [provider]  HUMIRA PEN-PSOR/UVEIT STARTER 80 MG/0.8ML & 40MG /0.4ML PNKT  09/27/19  Yes [provider]  ibuprofen (ADVIL) 200 MG tablet Take 400 mg by mouth every 8 (eight) hours as needed.   Yes [provider]  JANUVIA 100 MG tablet TAKE 1 TABLET (100 MG TOTAL) BY MOUTH DAILY. 03/14/19  Yes [name]  H, MD  metoprolol succinate (TOPROL-XL) 25 MG 24 hr tablet Take 12.5 mg by mouth daily.    Yes [provider]  naproxen sodium (ALEVE) 220 MG tablet Take 220 mg by mouth.   Yes [provider]  pregabalin (LYRICA) 100 MG capsule Take 1 capsule (100 mg total) by mouth 2 (two) times daily. 08/16/19  Yes Edward Jolly, MD  traMADol-acetaminophen (ULTRACET) 37.5-325 MG tablet Take 1 tablet by mouth 2 (two) times daily as needed. 09/05/19  Yes [provider]  aspirin 81 MG chewable tablet Chew by mouth daily.    [provider]  COSENTYX SENSOREADY 300 DOSE 150 MG/ML SOAJ Inject 600 mg into the skin every 30 (thirty) days. 01/26/16   [provider]  nitrofurantoin, macrocrystal-monohydrate, (MACROBID) 100 MG capsule Take 1 capsule (100 mg total) by mouth 2 (two)  times daily. 10/10/19   Payton Mccallum, MD  gabapentin (NEURONTIN) 100 MG capsule Take 1 capsule (100 mg total) by mouth 3 (three) times daily. Patient not taking: Reported on 01/12/2019 12/18/18 02/01/19  Reubin Milan, MD    Family History Family History  Problem Relation Age of Onset  . Cancer Mother   . Heart disease Father   . Diabetes Son   . Breast cancer Neg Hx     Social History Social History   Tobacco Use  . Smoking status: Former Smoker    Packs/day: 0.25    Years: 20.00    Pack years: 5.00    Types: Cigarettes    Quit date: 10/10/1979    Years since quitting: 40.0  . Smokeless tobacco: Never Used  Substance Use Topics  . Alcohol use: No    Alcohol/week: 0.0 standard drinks  . Drug use: No     Allergies   Metformin   Review of Systems Review of Systems  Genitourinary: Positive for frequency.     Physical Exam Triage Vital Signs ED Triage Vitals  Enc Vitals Group     BP 10/10/19 1056 (!) 119/58     Pulse Rate 10/10/19 1056 75     Resp 10/10/19 1056 18     Temp 10/10/19 1056 98.1 F (36.7 C)     Temp Source 10/10/19 1056 Oral     SpO2 10/10/19 1056 98 %     Weight 10/10/19 1057 160 lb (72.6 kg)     Height 10/10/19 1057 5\' 6"  (1.676 m)     Head Circumference --      Peak Flow --      Pain Score 10/10/19 1057 0     Pain Loc --      Pain Edu? --      Excl. in GC? --    No data found.  Updated Vital Signs BP (!) 119/58 (BP Location: Left Arm)   Pulse 75   Temp 98.1 F (36.7 C) (Oral)   Resp 18   Ht 5\' 6"  (1.676 m)   Wt 72.6 kg   SpO2 98%   BMI 25.82 kg/m   Visual Acuity Right Eye Distance:   Left Eye Distance:   Bilateral Distance:    Right Eye Near:   Left Eye Near:    Bilateral Near:     Physical Exam Vitals and nursing note reviewed.  Constitutional:      General: She is not in acute distress.    Appearance: She is not ill-appearing, toxic-appearing or diaphoretic.  Cardiovascular:     Rate and Rhythm: Normal rate.    Abdominal:  General: There is no distension.     Palpations: Abdomen is soft.     Tenderness: There is no abdominal tenderness. There is no right CVA tenderness or left CVA tenderness.  Neurological:     Mental Status: She is alert.      UC Treatments / Results  Labs (all labs ordered are listed, but only abnormal results are displayed) Labs Reviewed  URINALYSIS, COMPLETE (UACMP) WITH MICROSCOPIC - Abnormal; Notable for the following components:      Result Value   APPearance CLOUDY (*)    Specific Gravity, Urine >1.030 (*)    Hgb urine dipstick MODERATE (*)    Ketones, ur TRACE (*)    Protein, ur 100 (*)    Leukocytes,Ua MODERATE (*)    Bacteria, UA FEW (*)    All other components within normal limits  GLUCOSE, CAPILLARY - Abnormal; Notable for the following components:   Glucose-Capillary 225 (*)    All other components within normal limits  URINE CULTURE  CBG MONITORING, ED    EKG   Radiology No results found.  Procedures Procedures (including critical care time)  Medications Ordered in UC Medications - No data to display  Initial Impression / Assessment and Plan / UC Course  I have reviewed the triage vital signs and the nursing notes.  Pertinent labs & imaging results that were available during my care of the patient were reviewed by me and considered in my medical decision making (see chart for details).     Final Clinical Impressions(s) / UC Diagnoses   Final diagnoses:  Lower urinary tract infectious disease     Discharge Instructions     Increase water intake    ED Prescriptions    Medication Sig Dispense Auth. Provider   nitrofurantoin, macrocrystal-monohydrate, (MACROBID) 100 MG capsule Take 1 capsule (100 mg total) by mouth 2 (two) times daily. 14 capsule Payton Mccallum, MD      1. Lab results and diagnosis reviewed with patient 2. rx as per orders above; reviewed possible side effects, interactions, risks and benefits  3.  Recommend supportive treatment as above 4. Check urine culture 5. Follow-up prn if symptoms worsen or don't improve  PDMP not reviewed this encounter.   Payton Mccallum, MD 10/10/19 [phone]

## 2019-10-10 NOTE — ED Triage Notes (Signed)
Patient in today c/o urinary frequency, urgency and incontinence  x 2 days. Patient denies fever.

## 2019-10-12 LAB — URINE CULTURE
Culture: >=100000 — AB
Special Requests: NORMAL

## 2019-10-17 ENCOUNTER — Telehealth (HOSPITAL_COMMUNITY): Payer: Self-pay

## 2019-10-17 NOTE — Telephone Encounter (Signed)
Call to pt.  Confirmed pt was tx appropriately with Macrobid.  Pt states she has completed abx and symptoms have resolved.  Denies further questions at this time.

## 2019-10-23 DIAGNOSIS — L400 Psoriasis vulgaris: Secondary | ICD-10-CM | POA: Diagnosis not present

## 2019-11-05 ENCOUNTER — Telehealth: Payer: Self-pay

## 2019-11-05 NOTE — Telephone Encounter (Signed)
Called and offered to schedule appt for pt- she said she just "cannot walk and has a lot going on." She has had shingles and spinal stenoisis. I told her to call when everything gets settled and schedule appt.

## 2019-11-08 DIAGNOSIS — Z79891 Long term (current) use of opiate analgesic: Secondary | ICD-10-CM | POA: Diagnosis not present

## 2019-11-08 DIAGNOSIS — Z87891 Personal history of nicotine dependence: Secondary | ICD-10-CM | POA: Diagnosis not present

## 2019-11-08 DIAGNOSIS — E1165 Type 2 diabetes mellitus with hyperglycemia: Secondary | ICD-10-CM | POA: Diagnosis not present

## 2019-11-08 DIAGNOSIS — Z8249 Family history of ischemic heart disease and other diseases of the circulatory system: Secondary | ICD-10-CM | POA: Diagnosis not present

## 2019-11-08 DIAGNOSIS — I10 Essential (primary) hypertension: Secondary | ICD-10-CM | POA: Diagnosis not present

## 2019-11-08 DIAGNOSIS — F419 Anxiety disorder, unspecified: Secondary | ICD-10-CM | POA: Diagnosis not present

## 2019-11-08 DIAGNOSIS — Z809 Family history of malignant neoplasm, unspecified: Secondary | ICD-10-CM | POA: Diagnosis not present

## 2019-11-08 DIAGNOSIS — E1142 Type 2 diabetes mellitus with diabetic polyneuropathy: Secondary | ICD-10-CM | POA: Diagnosis not present

## 2019-11-08 DIAGNOSIS — M2550 Pain in unspecified joint: Secondary | ICD-10-CM | POA: Diagnosis not present

## 2019-11-08 DIAGNOSIS — K5900 Constipation, unspecified: Secondary | ICD-10-CM | POA: Diagnosis not present

## 2019-11-08 DIAGNOSIS — Z79899 Other long term (current) drug therapy: Secondary | ICD-10-CM | POA: Diagnosis not present

## 2019-11-08 DIAGNOSIS — B0223 Postherpetic polyneuropathy: Secondary | ICD-10-CM | POA: Diagnosis not present

## 2019-11-08 DIAGNOSIS — G8929 Other chronic pain: Secondary | ICD-10-CM | POA: Diagnosis not present

## 2019-11-08 DIAGNOSIS — L4050 Arthropathic psoriasis, unspecified: Secondary | ICD-10-CM | POA: Diagnosis not present

## 2019-11-27 ENCOUNTER — Ambulatory Visit: Payer: Medicare PPO | Admitting: Internal Medicine

## 2019-11-27 ENCOUNTER — Other Ambulatory Visit: Payer: Self-pay

## 2019-11-27 ENCOUNTER — Encounter: Payer: Self-pay | Admitting: Internal Medicine

## 2019-11-27 ENCOUNTER — Other Ambulatory Visit: Payer: Self-pay | Admitting: Internal Medicine

## 2019-11-27 VITALS — BP 124/72 | HR 89 | Temp 97.1°F | Ht 66.0 in | Wt 161.0 lb

## 2019-11-27 DIAGNOSIS — L409 Psoriasis, unspecified: Secondary | ICD-10-CM

## 2019-11-27 DIAGNOSIS — F411 Generalized anxiety disorder: Secondary | ICD-10-CM

## 2019-11-27 DIAGNOSIS — E1169 Type 2 diabetes mellitus with other specified complication: Secondary | ICD-10-CM

## 2019-11-27 DIAGNOSIS — M48061 Spinal stenosis, lumbar region without neurogenic claudication: Secondary | ICD-10-CM | POA: Diagnosis not present

## 2019-11-27 DIAGNOSIS — E785 Hyperlipidemia, unspecified: Secondary | ICD-10-CM | POA: Diagnosis not present

## 2019-11-27 DIAGNOSIS — E118 Type 2 diabetes mellitus with unspecified complications: Secondary | ICD-10-CM | POA: Diagnosis not present

## 2019-11-27 DIAGNOSIS — L400 Psoriasis vulgaris: Secondary | ICD-10-CM | POA: Diagnosis not present

## 2019-11-27 NOTE — Progress Notes (Signed)
Date:  11/27/2019   Name:  Jordan Montoya   DOB:  [birth year]   MRN:  [id number]   Chief Complaint: Diabetes (Follow up. Foot exam. A1C. Microalbumin. )  Diabetes She presents for her follow-up diabetic visit. She has type 2 diabetes mellitus. Her disease course has been stable. Pertinent negatives for hypoglycemia include no headaches or tremors. Pertinent negatives for diabetes include no chest pain, no fatigue, no polydipsia and no polyuria. Current diabetic treatment includes oral agent (monotherapy) (Januvia alone; stopped glimepriide). She is compliant with treatment all of the time.  Back Pain This is a chronic problem. The problem occurs intermittently. Pertinent negatives include no abdominal pain, chest pain, dysuria, fever, headaches or numbness.  Anxiety Presents for follow-up visit. Patient reports no chest pain, palpitations or shortness of breath. Symptoms occur occasionally.   Compliance with medications is 76-100% (only taking medication as needed).    Lab Results  Component Value Date   CREATININE 0.67 04/13/2019   BUN 21 04/13/2019   NA 141 04/13/2019   K 4.0 04/13/2019   CL 107 04/13/2019   CO2 26 04/13/2019   Lab Results  Component Value Date   CHOL 166 08/31/2017   HDL 58 08/31/2017   LDLCALC 64 08/31/2017   TRIG 218 (H) 08/31/2017   CHOLHDL 2.9 08/31/2017   Lab Results  Component Value Date   TSH 0.739 02/28/2019   Lab Results  Component Value Date   HGBA1C 5.6 02/28/2019   Lab Results  Component Value Date   WBC 7.2 04/13/2019   HGB 13.0 04/13/2019   HCT 39.2 04/13/2019   MCV 94.0 04/13/2019   PLT 224 04/13/2019   Lab Results  Component Value Date   ALT 14 04/13/2019   AST 15 04/13/2019   ALKPHOS 62 04/13/2019   BILITOT 0.5 04/13/2019     Review of Systems  Constitutional: Negative for appetite change, fatigue, fever and unexpected weight change.  HENT: Negative for tinnitus and trouble swallowing.   Eyes: Negative for  visual disturbance.  Respiratory: Negative for cough, chest tightness and shortness of breath.   Cardiovascular: Negative for chest pain, palpitations and leg swelling.  Gastrointestinal: Negative for abdominal pain.  Endocrine: Negative for polydipsia and polyuria.  Genitourinary: Negative for dysuria and hematuria.  Musculoskeletal: Positive for back pain. Negative for arthralgias.  Neurological: Negative for tremors, numbness and headaches.  Psychiatric/Behavioral: Negative for dysphoric mood.    Patient Active Problem List   Diagnosis Date Noted  . Chronic pain syndrome 06/11/2019  . Post herpetic neuralgia 03/20/2019  . Spinal stenosis of lumbar region at multiple levels 03/16/2019  . Anxiety disorder 11/16/2018  . Primary osteoarthritis of both hands 08/16/2018  . Arthralgia of multiple sites 05/18/2018  . Pernicious anemia 12/29/2017  . Type II diabetes mellitus with complication (HCC) 09/30/2015  . Environmental and seasonal allergies 05/30/2015  . Hip pain, chronic 01/27/2015  . 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 Avitaminosis D 11/25/2014  . Psoriasis 11/25/2014  . Hyperlipidemia associated with type 2 diabetes mellitus (HCC) 08/20/2011    Allergies  Allergen Reactions  . Metformin Nausea Only    Past Surgical History:  Procedure Laterality Date  . BREAST BIOPSY Left    neg-bx/clip  . BREAST CYST EXCISION Left    neg  . EYE SURGERY Bilateral    cataracts  . PARTIAL HYSTERECTOMY     one ovary remains    Social History   Tobacco  Use  . Smoking status: Former Smoker    Packs/day: 0.25    Years: 20.00    Pack years: 5.00    Types: Cigarettes    Quit date: 10/10/1979    Years since quitting: 40.1  . Smokeless tobacco: Never Used  Substance Use Topics  . Alcohol use: No    Alcohol/week: 0.0 standard drinks  . Drug use: No     Medication list has been reviewed and updated.  Current Meds    Medication Sig  . acetaminophen (TYLENOL) 650 MG CR tablet Take 650 mg by mouth every 8 (eight) hours as needed for pain.   Marland Kitchen ALPRAZolam (XANAX) 0.25 MG tablet TAKE 1 TABLET BY MOUTH DAILY AS NEEDED FOR ANXIETY  . aspirin 325 MG EC tablet Take 325 mg by mouth daily.  Marland Kitchen aspirin 81 MG chewable tablet Chew by mouth daily.  Marland Kitchen docusate sodium (COLACE) 100 MG capsule Take 200 mg by mouth 2 (two) times daily as needed for mild constipation.  Marland Kitchen HUMIRA PEN-PSOR/UVEIT STARTER 80 MG/0.8ML & 40MG /0.4ML PNKT   . ibuprofen (ADVIL) 200 MG tablet Take 400 mg by mouth every 8 (eight) hours as needed.  JANUVIA 100 MG tablet TAKE 1 TABLET (100 MG TOTAL) BY MOUTH DAILY.  . metoprolol succinate (TOPROL-XL) 25 MG 24 hr tablet Take 12.5 mg by mouth daily.   . naproxen sodium (ALEVE) 220 MG tablet Take 220 mg by mouth.  . pregabalin (LYRICA) 100 MG capsule Take 1 capsule (100 mg total) by mouth 2 (two) times daily.  . traMADol-acetaminophen (ULTRACET) 37.5-325 MG tablet Take 1 tablet by mouth 2 (two) times daily as needed.    PHQ 2/9 Scores 11/27/2019 08/28/2019 05/01/2019 03/16/2019  PHQ - 2 Score 0 0 0 5  PHQ- 9 Score 0 6 3 7     BP Readings from Last 3 Encounters:  11/27/19 124/72  10/10/19 (!) 119/58  06/11/19 129/61    Physical Exam Vitals and nursing note reviewed.  Constitutional:      General: She is not in acute distress.    Appearance: She is well-developed.  HENT:     Head: Normocephalic and atraumatic.  Neck:     Vascular: No carotid bruit.  Cardiovascular:     Rate and Rhythm: Normal rate and regular rhythm.     Pulses: Normal pulses.     Heart sounds: No murmur.  Pulmonary:     Effort: Pulmonary effort is normal. No respiratory distress.     Breath sounds: No wheezing or rhonchi.  Musculoskeletal:        General: Normal range of motion.     Cervical back: Normal range of motion.     Right lower leg: No edema.     Left lower leg: No edema.  Lymphadenopathy:     Cervical: No  cervical adenopathy.  Skin:    General: Skin is warm and dry.     Capillary Refill: Capillary refill takes less than 2 seconds.     Findings: No rash.          Comments: Extensive area of previous Zoster healed with thinning of skin  Neurological:     General: No focal deficit present.     Mental Status: She is alert and oriented to person, place, and time.  Psychiatric:        Attention and Perception: Attention normal.        Mood and Affect: Mood and affect normal.        Behavior:  Behavior normal.     Wt Readings from Last 3 Encounters:  11/27/19 161 lb (73 kg)  10/10/19 160 lb (72.6 kg)  08/28/19 149 lb (67.6 kg)    BP 124/72   Pulse 89   Temp (!) 97.1 F (36.2 C) (Temporal)   Ht 5\' 6"  (1.676 m)   Wt 161 lb (73 kg)   SpO2 94%   BMI 25.99 kg/m   Assessment and Plan: 1. Type II diabetes mellitus with complication (HCC) Clinically stable by exam and report without s/s of hypoglycemia. DM complicated by lipids. Tolerating medications januvia well without side effects or other concerns. - Microalbumin / creatinine urine ratio - Hemoglobin A1c - Comprehensive metabolic panel  2. Psoriasis Being followed by Dr. Phillip Heal On Humira due to insurance but did much better on Cosentyx  3. Generalized anxiety disorder She is doing better now that she can get out on her own She takes 1/2 of a 0.25 Alprazolam as needed only - TSH  4. Spinal stenosis of lumbar region at multiple levels S/p ESI with improvement  Able to ambulate ad lib with a walker for longer distances Taking Ultracet PRN - less than once per day  5. Hyperlipidemia associated with type 2 diabetes mellitus ([place]) Not on cholesterol lowering medications due to age, patient preference and low LDL - Lipid panel   Partially dictated using Editor, commissioning. Any errors are unintentional.  [name], MD [facility]  11/27/2019

## 2019-11-28 LAB — COMPREHENSIVE METABOLIC PANEL
ALT: 12 IU/L (ref 0–32)
AST: 16 IU/L (ref 0–40)
Albumin/Globulin Ratio: 1.5 (ref 1.2–2.2)
Albumin: 4.2 g/dL (ref 3.6–4.6)
Alkaline Phosphatase: 101 IU/L (ref 39–117)
BUN/Creatinine Ratio: 29 — ABNORMAL HIGH (ref 12–28)
BUN: 23 mg/dL (ref 8–27)
Bilirubin Total: 0.3 mg/dL (ref 0.0–1.2)
CO2: 23 mmol/L (ref 20–29)
Calcium: 9.4 mg/dL (ref 8.7–10.3)
Chloride: 107 mmol/L — ABNORMAL HIGH (ref 96–106)
Creatinine, Ser: 0.79 mg/dL (ref 0.57–1.00)
GFR calc Af Amer: 79 mL/min/{1.73_m2} (ref >59)
GFR calc non Af Amer: 69 mL/min/{1.73_m2} (ref >59)
Globulin, Total: 2.8 g/dL (ref 1.5–4.5)
Glucose: 110 mg/dL — ABNORMAL HIGH (ref 65–99)
Potassium: 4.5 mmol/L (ref 3.5–5.2)
Sodium: 143 mmol/L (ref 134–144)
Total Protein: 7 g/dL (ref 6.0–8.5)

## 2019-11-28 LAB — LIPID PANEL
Chol/HDL Ratio: 2.8 ratio (ref 0.0–4.4)
Cholesterol, Total: 165 mg/dL (ref 100–199)
HDL: 59 mg/dL (ref >39)
LDL Chol Calc (NIH): 79 mg/dL (ref 0–99)
Triglycerides: 158 mg/dL — ABNORMAL HIGH (ref 0–149)
VLDL Cholesterol Cal: 27 mg/dL (ref 5–40)

## 2019-11-28 LAB — HEMOGLOBIN A1C
Est. average glucose Bld gHb Est-mCnc: 126 mg/dL
Hgb A1c MFr Bld: 6 % — ABNORMAL HIGH (ref 4.8–5.6)

## 2019-11-28 LAB — TSH: TSH: 2.87 u[IU]/mL (ref 0.450–4.500)

## 2019-11-30 LAB — MICROALBUMIN / CREATININE URINE RATIO
Creatinine, Urine: 135.5 mg/dL
Microalb/Creat Ratio: 17 mg/g creat (ref 0–29)
Microalbumin, Urine: 22.8 ug/mL

## 2019-12-01 ENCOUNTER — Other Ambulatory Visit: Payer: Self-pay | Admitting: Internal Medicine

## 2019-12-01 DIAGNOSIS — B029 Zoster without complications: Secondary | ICD-10-CM

## 2019-12-01 NOTE — Telephone Encounter (Signed)
Requested medication (s) are due for refill today: yes  Requested medication (s) are on the active medication list: yes  Last refill:  08/16/19  Future visit scheduled: yes  Notes to clinic:  Called pt and pt stated that Dr Cherylann Ratel did not prescribe med. Chart review indicated was not sent in electronically or called in instead listed as "no print." Called [facility] on file and was informed the prescription has expired and Dr Judithann Graves was the provider that ordered it. Pt stated she only has enough pills to last until Monday.   Requested Prescriptions  Pending Prescriptions Disp Refills   pregabalin (LYRICA) 100 MG capsule [Pharmacy Med Name: PREGABALIN 100 MG CAPSULE] 90 capsule     Sig: TAKE 1 CAPSULE BY MOUTH THREE TIMES A DAY      Not Delegated - Neurology:  Anticonvulsants - Controlled Failed - 12/01/2019  1:46 PM      Failed - This refill cannot be delegated      Passed - Valid encounter within last 12 months    Recent Outpatient Visits           4 days ago Type II diabetes mellitus with complication [facility])   [facility] Reubin Milan, MD   3 months ago Urinary frequency   [facility] Reubin Milan, MD   7 months ago Spinal stenosis of lumbar region at multiple levels   [facility] Reubin Milan, MD   8 months ago Community acquired pneumonia of right middle lobe of lung [facility])   [facility] Reubin Milan, MD   10 months ago Herpes zoster without complication   [facility] Reubin Milan, MD       Future Appointments             In 4 months Judithann Graves Nyoka Cowden, MD [facility]

## 2019-12-05 DIAGNOSIS — L400 Psoriasis vulgaris: Secondary | ICD-10-CM | POA: Diagnosis not present

## 2019-12-05 DIAGNOSIS — B0229 Other postherpetic nervous system involvement: Secondary | ICD-10-CM | POA: Diagnosis not present

## 2019-12-05 DIAGNOSIS — B029 Zoster without complications: Secondary | ICD-10-CM | POA: Diagnosis not present

## 2019-12-10 ENCOUNTER — Other Ambulatory Visit: Payer: Self-pay | Admitting: Internal Medicine

## 2020-01-02 ENCOUNTER — Other Ambulatory Visit: Payer: Self-pay

## 2020-01-02 ENCOUNTER — Encounter: Payer: Self-pay | Admitting: Internal Medicine

## 2020-01-02 ENCOUNTER — Ambulatory Visit: Payer: Medicare PPO | Admitting: Internal Medicine

## 2020-01-02 VITALS — BP 132/58 | HR 82 | Temp 98.4°F | Ht 66.0 in | Wt 162.0 lb

## 2020-01-02 DIAGNOSIS — N3000 Acute cystitis without hematuria: Secondary | ICD-10-CM | POA: Diagnosis not present

## 2020-01-02 LAB — POC URINALYSIS WITH MICROSCOPIC (NON AUTO)MANUAL RESULT
Bilirubin, UA: NEGATIVE
Crystals: 0
Epithelial cells, urine per micros: 2
Glucose, UA: NEGATIVE
Ketones, UA: NEGATIVE
Mucus, UA: 0
Nitrite, UA: NEGATIVE
Protein, UA: POSITIVE — AB
RBC: 5 M/uL (ref 4.04–5.48)
Spec Grav, UA: >=1.03 — AB (ref 1.010–1.025)
Urobilinogen, UA: 0.2 E.U./dL
WBC Casts, UA: 15
pH, UA: 6 (ref 5.0–8.0)

## 2020-01-02 MED ORDER — NITROFURANTOIN MONOHYD MACRO 100 MG PO CAPS: 100.0000 mg | ORAL_CAPSULE | Freq: Two times a day (BID) | ORAL | 0 refills | Status: AC

## 2020-01-02 NOTE — Progress Notes (Signed)
Date:  01/02/2020   Name:  Jordan Montoya   DOB:  [birth year]   MRN:  [id number]   Chief Complaint: Urinary Tract Infection (X1 week ,ache in lower belly, when she gets up and goes to the bathroom "feels like she is about to bust" freq urination during the night it drips in her depends)  Urinary Tract Infection  This is a new problem. The current episode started in the past 7 days. The quality of the pain is described as aching. The pain is mild. There has been no fever. Associated symptoms include flank pain, frequency, hesitancy and urgency. Pertinent negatives include no chills, hematuria, nausea or vomiting. She has tried increased fluids for the symptoms. The treatment provided mild relief.    Lab Results  Component Value Date   CREATININE 0.79 11/27/2019   BUN 23 11/27/2019   NA 143 11/27/2019   K 4.5 11/27/2019   CL 107 (H) 11/27/2019   CO2 23 11/27/2019   Lab Results  Component Value Date   CHOL 165 11/27/2019   HDL 59 11/27/2019   LDLCALC 79 11/27/2019   TRIG 158 (H) 11/27/2019   CHOLHDL 2.8 11/27/2019   Lab Results  Component Value Date   TSH 2.870 11/27/2019   Lab Results  Component Value Date   HGBA1C 6.0 (H) 11/27/2019   Lab Results  Component Value Date   WBC 7.2 04/13/2019   HGB 13.0 04/13/2019   HCT 39.2 04/13/2019   MCV 94.0 04/13/2019   PLT 224 04/13/2019   Lab Results  Component Value Date   ALT 12 11/27/2019   AST 16 11/27/2019   ALKPHOS 101 11/27/2019   BILITOT 0.3 11/27/2019     Review of Systems  Constitutional: Negative for chills and fever.  Respiratory: Negative for chest tightness and shortness of breath.   Cardiovascular: Negative for chest pain, palpitations and leg swelling.  Gastrointestinal: Negative for nausea and vomiting.  Genitourinary: Positive for flank pain, frequency, hesitancy and urgency. Negative for hematuria.  Neurological: Negative for dizziness and headaches.    Patient Active Problem List   Diagnosis  Date Noted  . Chronic pain syndrome 06/11/2019  . Post herpetic neuralgia 03/20/2019  . Spinal stenosis of lumbar region at multiple levels 03/16/2019  . Anxiety disorder 11/16/2018  . Primary osteoarthritis of both hands 08/16/2018  . Arthralgia of multiple sites 05/18/2018  . Pernicious anemia 12/29/2017  . Type II diabetes mellitus with complication (HCC) 09/30/2015  . Environmental and seasonal allergies 05/30/2015  . Hip pain, chronic 01/27/2015  . 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 Avitaminosis D 11/25/2014  . Psoriasis 11/25/2014  . Hyperlipidemia associated with type 2 diabetes mellitus (HCC) 08/20/2011    Allergies  Allergen Reactions  . Metformin Nausea Only    Past Surgical History:  Procedure Laterality Date  . BREAST BIOPSY Left    neg-bx/clip  . BREAST CYST EXCISION Left    neg  . EYE SURGERY Bilateral    cataracts  . PARTIAL HYSTERECTOMY     one ovary remains    Social History   Tobacco Use  . Smoking status: Former Smoker    Packs/day: 0.25    Years: 20.00    Pack years: 5.00    Types: Cigarettes    Quit date: 10/10/1979    Years since quitting: 40.2  . Smokeless tobacco: Never Used  Substance Use Topics  . Alcohol use: No    Alcohol/week: 0.0  standard drinks  . Drug use: No     Medication list has been reviewed and updated.  Current Meds  Medication Sig  . acetaminophen (TYLENOL) 650 MG CR tablet Take 650 mg by mouth every 8 (eight) hours as needed for pain.   Marland Kitchen ALPRAZolam (XANAX) 0.25 MG tablet TAKE 1 TABLET BY MOUTH DAILY AS NEEDED FOR ANXIETY  . aspirin 325 MG EC tablet Take 325 mg by mouth daily. As needed  . docusate sodium (COLACE) 100 MG capsule Take 200 mg by mouth 2 (two) times daily as needed for mild constipation.  . Garlic 1696 MG CAPS Take 1,000 mg by mouth in the morning and at bedtime.  Marland Kitchen ibuprofen (ADVIL) 200 MG tablet Take 400 mg by mouth every 8 (eight) hours as needed.    Marland Kitchen JANUVIA 100 MG tablet TAKE 1 TABLET (100 MG TOTAL) BY MOUTH DAILY.  . metoprolol succinate (TOPROL-XL) 25 MG 24 hr tablet Take 12.5 mg by mouth daily.   . naproxen sodium (ALEVE) 220 MG tablet Take 220 mg by mouth.  . pregabalin (LYRICA) 100 MG capsule Take 1 capsule (100 mg total) by mouth 2 (two) times daily.  . traMADol-acetaminophen (ULTRACET) 37.5-325 MG tablet Take 1 tablet by mouth 2 (two) times daily as needed.    PHQ 2/9 Scores 01/02/2020 11/27/2019 08/28/2019 05/01/2019  PHQ - 2 Score 0 0 0 0  PHQ- 9 Score 1 0 6 3    BP Readings from Last 3 Encounters:  01/02/20 (!) 132/58  11/27/19 124/72  10/10/19 (!) 119/58    Physical Exam Vitals and nursing note reviewed.  Constitutional:      Appearance: Normal appearance. She is well-developed.  Cardiovascular:     Rate and Rhythm: Normal rate and regular rhythm.     Heart sounds: Normal heart sounds.  Pulmonary:     Effort: Pulmonary effort is normal. No respiratory distress.     Breath sounds: Normal breath sounds.  Abdominal:     General: Bowel sounds are normal.     Palpations: Abdomen is soft.     Tenderness: There is abdominal tenderness in the suprapubic area. There is no right CVA tenderness, left CVA tenderness, guarding or rebound.  Musculoskeletal:     Cervical back: Normal range of motion.     Right lower leg: No edema.     Left lower leg: No edema.  Lymphadenopathy:     Cervical: No cervical adenopathy.  Neurological:     Mental Status: She is alert.     Wt Readings from Last 3 Encounters:  01/02/20 162 lb (73.5 kg)  11/27/19 161 lb (73 kg)  10/10/19 160 lb (72.6 kg)    BP (!) 132/58   Pulse 82   Temp 98.4 F (36.9 C) (Oral)   Ht 5\' 6"  (1.676 m)   Wt 162 lb (73.5 kg)   SpO2 94%   BMI 26.15 kg/m   Assessment and Plan: 1. Acute cystitis without hematuria Continue to push fluids Return if no improvement - POC urinalysis w microscopic (non auto) - nitrofurantoin, macrocrystal-monohydrate,  (MACROBID) 100 MG capsule; Take 1 capsule (100 mg total) by mouth 2 (two) times daily for 7 days.  Dispense: 14 capsule; Refill: 0   Partially dictated using Editor, commissioning. Any errors are unintentional.  [name], MD [facility]  01/02/2020

## 2020-01-16 ENCOUNTER — Ambulatory Visit: Payer: Self-pay | Admitting: *Deleted

## 2020-01-16 NOTE — Telephone Encounter (Signed)
Please call and schedule appt.  Thank you,  KP

## 2020-01-16 NOTE — Telephone Encounter (Signed)
Per initial encounter, "Patient requesting to speak with nurse regarding possible bladder infection. Patient really not willing to discuss further"; the pt states she was given meds for a "kidney infection", and she took it for a week; 3 days ago she started "pouring urine"; she has been wearing Depends; she denies fever, pain, and blood in her urine; recommendations made per nurse triage protocol; the pt verbalized understanding, but states she would like for Dr Jordan Montoya to call something in; she is agreeable to being seen if required; the pt sees   Dr Jordan Montoya at  [facility]; she can be contacted at [phone]; will route to office for final disposition.  Reason for Disposition . [1] Can't control passage of urine (i.e., urinary incontinence) AND [2] new onset (< 2 weeks) or worsening  Answer Assessment - Initial Assessment Questions 1. SYMPTOM: "What's the main symptom you're concerned about?" (e.g., frequency, incontinence)   "pouring urine" 2. ONSET: "When did the start?"    01/13/20 3. PAIN: "Is there any pain?" If so, ask: "How bad is it?" (Scale: 1-10; mild, moderate, severe)    no 4. CAUSE: "What do you think is causing the symptoms?"    Not sure 5. OTHER SYMPTOMS: "Do you have any other symptoms?" (e.g., fever, flank pain, blood in urine, pain with urination) Bladder pressure when going to bathroom; rated 7 out of 10 6. PREGNANCY: "Is there any chance you are pregnant?" "When was your last menstrual period?"     no  Protocols used: URINARY [facility]

## 2020-01-17 ENCOUNTER — Ambulatory Visit: Payer: Medicare PPO | Admitting: Internal Medicine

## 2020-01-17 ENCOUNTER — Encounter: Payer: Self-pay | Admitting: Internal Medicine

## 2020-01-17 ENCOUNTER — Other Ambulatory Visit: Payer: Self-pay

## 2020-01-17 VITALS — BP 124/72 | HR 72 | Temp 97.6°F | Ht 66.0 in | Wt 163.0 lb

## 2020-01-17 DIAGNOSIS — N3000 Acute cystitis without hematuria: Secondary | ICD-10-CM

## 2020-01-17 DIAGNOSIS — B0229 Other postherpetic nervous system involvement: Secondary | ICD-10-CM | POA: Diagnosis not present

## 2020-01-17 DIAGNOSIS — N3945 Continuous leakage: Secondary | ICD-10-CM | POA: Diagnosis not present

## 2020-01-17 MED ORDER — SULFAMETHOXAZOLE-TRIMETHOPRIM 800-160 MG PO TABS: 1.0000 | ORAL_TABLET | Freq: Two times a day (BID) | ORAL | 0 refills | Status: AC

## 2020-01-17 NOTE — Progress Notes (Signed)
Date:  01/17/2020   Name:  Jordan Montoya   DOB:  09/18/1933   MRN:  [id number]   Chief Complaint: Urinary Incontinence (X 2 weeks. Wearing depends and pads. Urinary is coming out now even when laying down. No control over bladder. Pelvic pain and frequency. )  Urinary Frequency  This is a new problem. The current episode started in the past 7 days. The problem occurs every urination. The problem has been rapidly worsening. The quality of the pain is described as burning. The pain is mild. There has been no fever. Associated symptoms include frequency and urgency. Pertinent negatives include no chills, hematuria, nausea or vomiting. Associated symptoms comments: Incontinence. She has tried nothing for the symptoms.    Lab Results  Component Value Date   CREATININE 0.79 11/27/2019   BUN 23 11/27/2019   NA 143 11/27/2019   K 4.5 11/27/2019   CL 107 (H) 11/27/2019   CO2 23 11/27/2019   Lab Results  Component Value Date   CHOL 165 11/27/2019   HDL 59 11/27/2019   LDLCALC 79 11/27/2019   TRIG 158 (H) 11/27/2019   CHOLHDL 2.8 11/27/2019   Lab Results  Component Value Date   TSH 2.870 11/27/2019   Lab Results  Component Value Date   HGBA1C 6.0 (H) 11/27/2019   Lab Results  Component Value Date   WBC 7.2 04/13/2019   HGB 13.0 04/13/2019   HCT 39.2 04/13/2019   MCV 94.0 04/13/2019   PLT 224 04/13/2019   Lab Results  Component Value Date   ALT 12 11/27/2019   AST 16 11/27/2019   ALKPHOS 101 11/27/2019   BILITOT 0.3 11/27/2019     Review of Systems  Constitutional: Negative for chills and fever.  Respiratory: Negative for cough and shortness of breath.   Cardiovascular: Positive for leg swelling. Negative for chest pain and palpitations.  Gastrointestinal: Negative for nausea and vomiting.  Genitourinary: Positive for dysuria, frequency and urgency. Negative for hematuria, vaginal bleeding and vaginal pain.       And new incontinence  Musculoskeletal:  Positive for back pain.  Neurological: Negative for dizziness, light-headedness and headaches.    Patient Active Problem List   Diagnosis Date Noted  . Chronic pain syndrome 06/11/2019  . Post herpetic neuralgia 03/20/2019  . Spinal stenosis of lumbar region at multiple levels 03/16/2019  . Anxiety disorder 11/16/2018  . Primary osteoarthritis of both hands 08/16/2018  . Arthralgia of multiple sites 05/18/2018  . Pernicious anemia 12/29/2017  . Type II diabetes mellitus with complication ([place]) 82/50/5397  . Environmental and seasonal allergies 05/30/2015  . Hip pain, chronic 01/27/2015  . Major depressive disorder with single episode, in partial remission ([place]) 11/25/2014  . Avitaminosis D 11/25/2014  . Psoriasis 11/25/2014  . Hyperlipidemia associated with type 2 diabetes mellitus ([place]) 08/20/2011    Allergies  Allergen Reactions  . Metformin Nausea Only    Past Surgical History:  Procedure Laterality Date  . BREAST BIOPSY Left    neg-bx/clip  . BREAST CYST EXCISION Left    neg  . EYE SURGERY Bilateral    cataracts  . PARTIAL HYSTERECTOMY     one ovary remains    Social History   Tobacco Use  . Smoking status: Former Smoker    Packs/day: 0.25    Years: 20.00    Pack years: 5.00    Types: Cigarettes    Quit date: 10/10/1979    Years since quitting: 40.2  . Smokeless  tobacco: Never Used  Vaping Use  . Vaping Use: Never used  Substance Use Topics  . Alcohol use: No    Alcohol/week: 0.0 standard drinks  . Drug use: No     Medication list has been reviewed and updated.  Current Meds  Medication Sig  . acetaminophen (TYLENOL) 650 MG CR tablet Take 650 mg by mouth every 8 (eight) hours as needed for pain.   Marland Kitchen ALPRAZolam (XANAX) 0.25 MG tablet TAKE 1 TABLET BY MOUTH DAILY AS NEEDED FOR ANXIETY  . aspirin 325 MG EC tablet Take 325 mg by mouth daily. As needed  . aspirin 81 MG chewable tablet Chew by mouth daily.  . COSENTYX SENSOREADY, 300 MG, 150 MG/ML SOAJ    . docusate sodium (COLACE) 100 MG capsule Take 200 mg by mouth 2 (two) times daily as needed for mild constipation.  . Garlic 1000 MG CAPS Take 1,000 mg by mouth in the morning and at bedtime.  Marland Kitchen ibuprofen (ADVIL) 200 MG tablet Take 400 mg by mouth every 8 (eight) hours as needed.  Marland Kitchen JANUVIA 100 MG tablet TAKE 1 TABLET (100 MG TOTAL) BY MOUTH DAILY.  . metoprolol succinate (TOPROL-XL) 25 MG 24 hr tablet Take 12.5 mg by mouth daily.   . naproxen sodium (ALEVE) 220 MG tablet Take 220 mg by mouth.  . pregabalin (LYRICA) 100 MG capsule Take 1 capsule (100 mg total) by mouth 2 (two) times daily.  . traMADol-acetaminophen (ULTRACET) 37.5-325 MG tablet Take 1 tablet by mouth 2 (two) times daily as needed.    PHQ 2/9 Scores 01/17/2020 01/02/2020 11/27/2019 08/28/2019  PHQ - 2 Score 0 0 0 0  PHQ- 9 Score 4 1 0 6    GAD 7 : Generalized Anxiety Score 01/17/2020 01/02/2020 05/01/2019 11/16/2018  Nervous, Anxious, on Edge 0 0 3 3  Control/stop worrying 0 0 3 0  Worry too much - different things 0 1 3 0  Trouble relaxing 0 0 1 3  Restless 0 0 0 3  Easily annoyed or irritable 0 0 0 0  Afraid - awful might happen 0 0 0 0  Total GAD 7 Score 0 1 10 9   Anxiety Difficulty - Not difficult at all Very difficult -    BP Readings from Last 3 Encounters:  01/17/20 124/72  01/02/20 (!) 132/58  11/27/19 124/72    Physical Exam Vitals and nursing note reviewed.  Constitutional:      Appearance: She is well-developed.  Cardiovascular:     Rate and Rhythm: Normal rate and regular rhythm.     Pulses: Normal pulses.     Heart sounds: Normal heart sounds.  Pulmonary:     Effort: Pulmonary effort is normal. No respiratory distress.     Breath sounds: Normal breath sounds.  Abdominal:     General: Bowel sounds are normal.     Palpations: Abdomen is soft.     Tenderness: There is abdominal tenderness in the suprapubic area. There is no guarding or rebound.  Musculoskeletal:     Cervical back: Normal range of  motion.     Right lower leg: Edema present.     Left lower leg: Edema present.  Skin:    Capillary Refill: Capillary refill takes less than 2 seconds.  Neurological:     General: No focal deficit present.     Wt Readings from Last 3 Encounters:  01/17/20 163 lb (73.9 kg)  01/02/20 162 lb (73.5 kg)  11/27/19 161 lb (73 kg)  BP 124/72 (BP Location: Right Arm, Patient Position: Sitting, Cuff Size: Normal)   Pulse 72   Temp 97.6 F (36.4 C) (Oral)   Ht 5\' 6"  (1.676 m)   Wt 163 lb (73.9 kg)   SpO2 96%   BMI 26.31 kg/m   Assessment and Plan: 1. Acute cystitis without hematuria Pt is unable to provide a specimen Will treat empirically and refer to Urology for the incontinence. - sulfamethoxazole-trimethoprim (BACTRIM DS) 800-160 MG tablet; Take 1 tablet by mouth 2 (two) times daily for 7 days.  Dispense: 14 tablet; Refill: 0  2. Continuous leakage of urine - Ambulatory referral to Urology  3. Post herpetic neuralgia Taking Lyrica 100 mg bid This may be contributing to her LE edema She does not feel that she can reduce the dose at this time.   Partially dictated using . Any errors are unintentional.  Animal nutritionist, MD [facility] [address] [facility]  01/17/2020

## 2020-01-24 NOTE — Progress Notes (Signed)
01/25/20 12:25 PM   Jordan Montoya 09/21/1933 [id number]  Referring provider: Reubin Milan, MD [address] [place],  [place] [address] Chief Complaint  Patient presents with  . New Patient (Initial Visit)    Continuous leakage of urine    HPI: Jordan Montoya is a 86 y.o. F who presents today for the evaluation and management of urinary incontinence.   Her symptoms started  a month ago.  She has developed severe urinary incontinence which is new/ frequency and urgency without dysuria or gross hematuria.  She had a positive urine culture resistent to Bactrim, Ampicillin, and Augmentin in March 2021.   POC UA from 01/02/20 revealed positive protein, moderate leukocytes, 5 RBC, 2+ bacteria and 2 epithelial cells. She was treated w/ Macrobid. No culture was ent.   Visited PCP on 01/17/20 c/o urinary incontinence x 2 weeks w/ associated symptoms of pelvic pain, frequency, burning w/ urination and urgency. She was unable to provide a urine specimen. Treated empirically w/ Bactrim.   She has not tried cranberry tablets.  She is diabetic, has been drinking cranberry juice.  Most recent upper tract imaging from 12/2018 revealed normal kidneys w/o stones.   PMH: Past Medical History:  Diagnosis Date  . Allergy   . Anxiety   . Avitaminosis D 11/25/2014  . Environmental and seasonal allergies 05/30/2015  . HLD (hyperlipidemia) 08/20/2011   Overview:  LDL goal <100 given DM2   . 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 Psoriasis 11/25/2014   followed by Dermatology   . Shingles 11/2018  . Type 2 diabetes mellitus with hyperosmolarity without coma, without long-term current use of insulin ([facility]) 09/30/2015  . Unspecified septicemia(038.9) ([facility]) 02/28/2019    Surgical History: Past Surgical History:  Procedure Laterality Date  . BREAST BIOPSY Left    neg-bx/clip  . BREAST CYST EXCISION Left    neg  . EYE SURGERY  Bilateral    cataracts  . PARTIAL HYSTERECTOMY     one ovary remains    Home Medications:  Allergies as of 01/25/2020      Reactions   Metformin Nausea Only      Medication List       Accurate as of January 25, 2020 11:59 PM. If you have any questions, ask your nurse or doctor.        STOP taking these medications   gabapentin 100 MG capsule Commonly known as: NEURONTIN Stopped by: Vanna Scotland, MD     TAKE these medications   acetaminophen 650 MG CR tablet Commonly known as: TYLENOL Take 650 mg by mouth every 8 (eight) hours as needed for pain.   ALPRAZolam 0.25 MG tablet Commonly known as: XANAX TAKE 1 TABLET BY MOUTH DAILY AS NEEDED FOR ANXIETY   aspirin 81 MG chewable tablet Chew by mouth daily.   aspirin 325 MG EC tablet Take 325 mg by mouth daily. As needed   cephALEXin 500 MG capsule Commonly known as: Keflex Take 1 capsule (500 mg total) by mouth 3 (three) times daily. Started by: Vanna Scotland, MD   Cosentyx Sensoready (300 MG) 150 MG/ML Soaj Generic drug: Secukinumab (300 MG Dose)   docusate sodium 100 MG capsule Commonly known as: COLACE Take 200 mg by mouth 2 (two) times daily as needed for mild constipation.   Garlic 1000 MG Caps Take 1,000 mg by mouth in the morning and at bedtime.   ibuprofen 200 MG tablet Commonly known  as: ADVIL Take 400 mg by mouth every 8 (eight) hours as needed.   Januvia 100 MG tablet Generic drug: sitaGLIPtin TAKE 1 TABLET (100 MG TOTAL) BY MOUTH DAILY.   metoprolol succinate 25 MG 24 hr tablet Commonly known as: TOPROL-XL Take 12.5 mg by mouth daily.   naproxen sodium 220 MG tablet Commonly known as: ALEVE Take 220 mg by mouth.   pregabalin 100 MG capsule Commonly known as: LYRICA Take 1 capsule (100 mg total) by mouth 2 (two) times daily.   traMADol-acetaminophen 37.5-325 MG tablet Commonly known as: ULTRACET Take 1 tablet by mouth 2 (two) times daily as needed.       Allergies:  Allergies    Allergen Reactions  . Metformin Nausea Only    Family History: Family History  Problem Relation Age of Onset  . Cancer Mother   . Heart disease Father   . Diabetes Son   . Breast cancer Neg Hx     Social History:  reports that she quit smoking about 40 years ago. Her smoking use included cigarettes. She has a 5.00 pack-year smoking history. She has never used smokeless tobacco. She reports that she does not drink alcohol and does not use drugs.   Physical Exam: BP 114/65   Pulse 86   Ht 5\' 6"  (1.676 m)   Wt 161 lb (73 kg)   BMI 25.99 kg/m   Constitutional:  Alert and oriented, No acute distress. HEENT: [place] AT, moist mucus membranes.  Trachea midline, no masses. Cardiovascular: No clubbing, cyanosis, or edema. Respiratory: Normal respiratory effort, no increased work of breathing. Skin: No rashes, bruises or suspicious lesions. Neurologic: Grossly intact, no focal deficits, moving all 4 extremities. Psychiatric: Normal mood and affect.  Laboratory Data:  Lab Results  Component Value Date   CREATININE 0.79 11/27/2019    Lab Results  Component Value Date   HGBA1C 6.0 (H) 11/27/2019    Urinalysis Component     Latest Ref Rng & Units 01/25/2020  Color, Urine     YELLOW YELLOW  Appearance     CLEAR HAZY (A)  Specific Gravity, Urine     1.005 - 1.030 1.020  pH     5.0 - 8.0 5.5  Glucose, UA     NEGATIVE mg/dL NEGATIVE  Hgb urine dipstick     NEGATIVE MODERATE (A)  Bilirubin Urine     NEGATIVE SMALL (A)  Ketones, ur     NEGATIVE mg/dL TRACE (A)  Protein     NEGATIVE mg/dL 30 (A)  Nitrite     NEGATIVE NEGATIVE  Leukocytes,Ua     NEGATIVE MODERATE (A)  RBC / HPF     0 - 5 RBC/hpf >50  WBC, UA     0 - 5 WBC/hpf >50  Bacteria, UA     NONE SEEN RARE (A)  Squamous Epithelial / LPF     0 - 5 0-5    Pertinent Imaging: Results for orders placed or performed during the hospital encounter of 01/25/20  Urine Culture   Specimen: Urine, Clean Catch  Result  Value Ref Range   Specimen Description      URINE, CLEAN CATCH Performed at [facility], [address], [place], [place] [address]    Special Requests      NONE Performed at [facility], [address] [place], [place] [address]    Culture 10,000 COLONIES/mL ESCHERICHIA COLI (A)    Report Status 01/27/2020 FINAL  Organism ID, Bacteria ESCHERICHIA COLI (A)       Susceptibility   Escherichia coli - MIC*    AMPICILLIN >=32 RESISTANT Resistant     CEFAZOLIN >=64 RESISTANT Resistant     CEFTRIAXONE <=0.25 SENSITIVE Sensitive     CIPROFLOXACIN <=0.25 SENSITIVE Sensitive     GENTAMICIN <=1 SENSITIVE Sensitive     IMIPENEM <=0.25 SENSITIVE Sensitive     NITROFURANTOIN <=16 SENSITIVE Sensitive     TRIMETH/SULFA >=320 RESISTANT Resistant     AMPICILLIN/SULBACTAM >=32 RESISTANT Resistant     PIP/TAZO <=4 SENSITIVE Sensitive     * 10,000 COLONIES/mL ESCHERICHIA COLI  Urinalysis, Complete w Microscopic  Result Value Ref Range   Color, Urine YELLOW YELLOW   APPearance HAZY (A) CLEAR   Specific Gravity, Urine 1.020 1.005 - 1.030   pH 5.5 5.0 - 8.0   Glucose, UA NEGATIVE NEGATIVE mg/dL   Hgb urine dipstick MODERATE (A) NEGATIVE   Bilirubin Urine SMALL (A) NEGATIVE   Ketones, ur TRACE (A) NEGATIVE mg/dL   Protein, ur 30 (A) NEGATIVE mg/dL   Nitrite NEGATIVE NEGATIVE   Leukocytes,Ua MODERATE (A) NEGATIVE   Squamous Epithelial / LPF 0-5 0 - 5   WBC, UA >50 0 - 5 WBC/hpf   RBC / HPF >50 0 - 5 RBC/hpf   Bacteria, UA RARE (A) NONE SEEN   Assessment & Plan:    1. rUTI UA today revealed >50 RBC/ WBC w/ rare bacteria  Urine culture sent  Unclear whether or not this is a persistent urinary tract infection, incompletely treated, true recurrent infections or perhaps additional underlying pathology She has grown bacteria resistant to Bactrim which she was prescribed at one point, it may be that she is growing resistant bacteria as cultures have not been sent the  last 2 times Recommended use of cranberry tablets bid and probiotics Rx of Keflex sent to pharmacy  Return in 2 weeks for cath UA/pelvic exam/assessment of whether she will need topical estrogen cream w/ Carollee Herter PA-C  2. Urinary incontinence  Trial of Myrbetriq 25 mg x 1 month samples given for her urgency/frequency for symptom control Ultimately, will need to address #1 this is likely a significant contributing factor to #2  [facility] [address] [place], [place] [address] [phone]  I, Donne Hazel, am acting as a scribe for Dr. Vanna Scotland,  I have reviewed the above documentation for accuracy and completeness, and I agree with the above.   Vanna Scotland, MD   I spent 45 total minutes on the day of the encounter including pre-visit review of the medical record, face-to-face time with the patient, and post visit ordering of labs/imaging/tests.

## 2020-01-25 ENCOUNTER — Other Ambulatory Visit: Payer: Self-pay

## 2020-01-25 ENCOUNTER — Ambulatory Visit: Payer: Medicare PPO | Admitting: Urology

## 2020-01-25 ENCOUNTER — Other Ambulatory Visit: Payer: Self-pay | Admitting: *Deleted

## 2020-01-25 ENCOUNTER — Encounter: Payer: Self-pay | Admitting: Urology

## 2020-01-25 ENCOUNTER — Other Ambulatory Visit
Admission: RE | Admit: 2020-01-25 | Discharge: 2020-01-25 | Disposition: A | Discharge status: Home / Self Care | Payer: Medicare PPO | Attending: Urology | Admitting: Urology

## 2020-01-25 VITALS — BP 114/65 | HR 86 | Ht 66.0 in | Wt 161.0 lb

## 2020-01-25 DIAGNOSIS — N3945 Continuous leakage: Secondary | ICD-10-CM

## 2020-01-25 LAB — URINALYSIS, COMPLETE (UACMP) WITH MICROSCOPIC
Glucose, UA: NEGATIVE mg/dL
Nitrite: NEGATIVE
Protein, ur: 30 mg/dL — AB
RBC / HPF: >50 RBC/hpf (ref 0–5)
Specific Gravity, Urine: 1.02 (ref 1.005–1.030)
WBC, UA: >50 WBC/hpf (ref 0–5)
pH: 5.5 (ref 5.0–8.0)

## 2020-01-25 LAB — BLADDER SCAN AMB NON-IMAGING: Scan Result: 0

## 2020-01-25 MED ORDER — CEPHALEXIN 500 MG PO CAPS: 500.0000 mg | ORAL_CAPSULE | Freq: Three times a day (TID) | ORAL | 0 refills | Status: DC

## 2020-01-25 NOTE — Patient Instructions (Signed)
We discussed the following today:  1.  We are going to send your urine for a culture today.  I went ahead and prescribed some antibiotics that I sent to your pharmacy.  2.  We are going to have you return in about 2 weeks for a pelvic exam and a catheterized urine specimen.  This way, we can ensure that the urine has cleared and that also there is no contamination from the collection technique.  We will also assess whether or not you would benefit from a topical cream to help recurrent infections.  3.  You were given samples of Myrbetriq 25 mg for a month today.  This medication can help with urgency and frequency symptoms as well as incontinence that you have been experiencing.  4.  Please start taking cranberry supplements twice daily as well as a probiotic.  You can pick this up at any drugstore, grocery store or pharmacy.  Any brand is fine.

## 2020-01-27 LAB — URINE CULTURE: Culture: 10000 — AB

## 2020-01-28 ENCOUNTER — Telehealth: Payer: Self-pay | Admitting: *Deleted

## 2020-01-28 MED ORDER — NITROFURANTOIN MONOHYD MACRO 100 MG PO CAPS: 100.0000 mg | ORAL_CAPSULE | Freq: Two times a day (BID) | ORAL | 0 refills | Status: DC

## 2020-01-28 NOTE — Addendum Note (Signed)
Addended by: Milas Kocher A on: 01/28/2020 01:11 PM   Modules accepted: Orders

## 2020-01-28 NOTE — Telephone Encounter (Addendum)
Patient informed, RX sent in as requested. Voiced understanding.    ---- Message from Vanna Scotland, MD sent at 01/27/2020 12:36 PM EDT ----- Please change abx to macrobid 100 mg bid x 10 days.  Resistant to Keflex.  Vanna Scotland, MD

## 2020-02-10 NOTE — Progress Notes (Signed)
01/25/20 11:06 AM   Jordan Montoya 12/27/1933 [id number]  Referring provider: Reubin Milan, MD [address] [place],  [place] [address] Chief Complaint  Patient presents with  . Follow-up    2 week follow-up with pelvic exam    HPI: Jordan Montoya is a 86 y.o. F with urinary incontinence and rUTI's who presents today for follow up.  She was seen by Dr. Vanna Scotland on 01/25/2020.  At that visit, she was given a trial of Myrbetriq 25 mg daily.  She grew out E. Coli and was treated with culture appropriate antibiotics.    The patient is  experiencing urgency x 0-3, frequency x 4-7, is restricting fluids to avoid visits to the restroom, is engaging in toilet mapping, incontinence x 0-3 and nocturia x 0-3.   Her BP is 128/69.   Her PVR is 50 mL.  She is at goal with her Myrbetriq 25 mg daily.    CATH UA negative.  PMH: Past Medical History:  Diagnosis Date  . Allergy   . Anxiety   . Avitaminosis D 11/25/2014  . Environmental and seasonal allergies 05/30/2015  . HLD (hyperlipidemia) 08/20/2011   Overview:  LDL goal <100 given DM2   . 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 Psoriasis 11/25/2014   followed by Dermatology   . Shingles 11/2018  . Type 2 diabetes mellitus with hyperosmolarity without coma, without long-term current use of insulin (HCC) 09/30/2015  . Unspecified septicemia(038.9) (HCC) 02/28/2019    Surgical History: Past Surgical History:  Procedure Laterality Date  . BREAST BIOPSY Left    neg-bx/clip  . BREAST CYST EXCISION Left    neg  . EYE SURGERY Bilateral    cataracts  . PARTIAL HYSTERECTOMY     one ovary remains    Home Medications:  Allergies as of 02/11/2020      Reactions   Metformin Nausea Only      Medication List       Accurate as of February 11, 2020 11:06 AM. If you have any questions, ask your nurse or doctor.        STOP taking these medications     nitrofurantoin (macrocrystal-monohydrate) 100 MG capsule Commonly known as: Macrobid Stopped by: Michiel Cowboy, PA-C     TAKE these medications   acetaminophen 650 MG CR tablet Commonly known as: TYLENOL Take 650 mg by mouth every 8 (eight) hours as needed for pain.   ALPRAZolam 0.25 MG tablet Commonly known as: XANAX TAKE 1 TABLET BY MOUTH DAILY AS NEEDED FOR ANXIETY   aspirin 81 MG chewable tablet Chew by mouth daily.   aspirin 325 MG EC tablet Take 325 mg by mouth daily. As needed   Cosentyx Sensoready (300 MG) 150 MG/ML Soaj Generic drug: Secukinumab (300 MG Dose)   docusate sodium 100 MG capsule Commonly known as: COLACE Take 200 mg by mouth 2 (two) times daily as needed for mild constipation.   Garlic 1000 MG Caps Take 1,000 mg by mouth in the morning and at bedtime.   ibuprofen 200 MG tablet Commonly known as: ADVIL Take 400 mg by mouth every 8 (eight) hours as needed.   Januvia 100 MG tablet Generic drug: sitaGLIPtin TAKE 1 TABLET (100 MG TOTAL) BY MOUTH DAILY.   metoprolol succinate 25 MG 24 hr tablet Commonly known as: TOPROL-XL Take 12.5 mg by mouth daily.   naproxen sodium 220 MG tablet Commonly known as: ALEVE Take  220 mg by mouth.   pregabalin 100 MG capsule Commonly known as: LYRICA Take 1 capsule (100 mg total) by mouth 2 (two) times daily.   Premarin vaginal cream Generic drug: conjugated estrogens Apply 0.5mg  (pea-sized amount)  just inside the vaginal introitus with a finger-tip on  Monday, Wednesday and Friday nights. Started by: Michiel Cowboy, PA-C   traMADol-acetaminophen 37.5-325 MG tablet Commonly known as: ULTRACET Take 1 tablet by mouth 2 (two) times daily as needed.       Allergies:  Allergies  Allergen Reactions  . Metformin Nausea Only    Family History: Family History  Problem Relation Age of Onset  . Cancer Mother   . Heart disease Father   . Diabetes Son   . Breast cancer Neg Hx     Social History:   reports that she quit smoking about 40 years ago. Her smoking use included cigarettes. She has a 5.00 pack-year smoking history. She has never used smokeless tobacco. She reports that she does not drink alcohol and does not use drugs.   Physical Exam: BP 128/69   Pulse 88   Ht 5\' 6"  (1.676 m)   Wt 163 lb (73.9 kg)   BMI 26.31 kg/m   Constitutional:  Well nourished. Alert and oriented, No acute distress. HEENT: [place] AT, mask in place.  Trachea midline Cardiovascular: No clubbing, cyanosis, or edema. Respiratory: Normal respiratory effort, no increased work of breathing. GI: Abdomen is soft, non tender, non distended, no abdominal masses. Liver and spleen not palpable.  No hernias appreciated.  Stool sample for occult testing is not indicated.   GU: No CVA tenderness.  No bladder fullness or masses.  Atrophic external genitalia, sparse pubic hair distribution, no lesions.  Normal urethral meatus, no lesions, no prolapse, no discharge.   No urethral masses, tenderness and/or tenderness. No bladder fullness, tenderness or masses. Pale vagina mucosa, poor estrogen effect, no discharge, no lesions, fair pelvic support, grade II cystocele and no rectocele noted.  Cervix and uterus are surgically absent.  Anus and perineum are without rashes or lesions.   Large external hemorrhoids noted.  Skin: No rashes, bruises or suspicious lesions. Lymph: No inguinal adenopathy. Neurologic: Grossly intact, no focal deficits, moving all 4 extremities. Psychiatric: Normal mood and affect.   Laboratory Data:  Lab Results  Component Value Date   CREATININE 0.79 11/27/2019    Lab Results  Component Value Date   HGBA1C 6.0 (H) 11/27/2019    Urinalysis CATH UA Component     Latest Ref Rng & Units 02/11/2020  Color, Urine     YELLOW YELLOW  Appearance     CLEAR CLEAR  Specific Gravity, Urine     1.005 - 1.030 1.025  pH     5.0 - 8.0 5.0  Glucose, UA     NEGATIVE mg/dL NEGATIVE  Hgb urine dipstick      NEGATIVE NEGATIVE  Bilirubin Urine     NEGATIVE NEGATIVE  Ketones, ur     NEGATIVE mg/dL TRACE (A)  Protein     NEGATIVE mg/dL NEGATIVE  Nitrite     NEGATIVE NEGATIVE  Leukocytes,Ua     NEGATIVE NEGATIVE  RBC / HPF     0 - 5 RBC/hpf 0-5  WBC, UA     0 - 5 WBC/hpf 0-5  Bacteria, UA     NONE SEEN NONE SEEN  Squamous Epithelial / LPF     0 - 5 0-5  Mucus      PRESENT  I have reviewed the labs.  Pertinent Imaging: No recent imaging  Assessment & Plan:    1. rUTI CATH UA today was negative  Continue cranberry tablets bid and probiotics   2. Urinary incontinence  At goal with Myrbetriq 25 mg daily- script sent to pharmacy RTC in 3 months for OAB questionnaire and PVR  3. Vaginal atrophy I explained to the patient that when women go through menopause and her estrogen levels are severely diminished, the normal vaginal flora will change.  This is due to an increase of the vaginal canal's pH. Because of this, the vaginal canal may be colonized by bacteria from the rectum instead of the protective lactobacillus.  This, accompanied by the loss of the mucus barrier with vaginal atrophy, is a cause of recurrent urinary tract infections. In some studies, the use of vaginal estrogen cream has been demonstrated to reduce  recurrent urinary tract infections to one a year.  Patient was given a prescription of vaginal estrogen cream (Premarin vaginal cream) and instructed to apply 0.5mg  (pea-sized amount)  just inside the vaginal introitus with a finger-tip on Monday, Wednesday and Friday nights.  I explained to the patient that vaginally administered estrogen, which causes only a slight increase in the blood estrogen levels, have fewer contraindications and adverse systemic effects that oral HT. She will follow up in three months for an exam.     [facility] [address], [address] [place], [place] [address] [phone]  Michiel Cowboy, PA-C

## 2020-02-11 ENCOUNTER — Other Ambulatory Visit: Payer: Self-pay

## 2020-02-11 ENCOUNTER — Ambulatory Visit: Payer: Medicare PPO | Admitting: Urology

## 2020-02-11 ENCOUNTER — Encounter: Payer: Self-pay | Admitting: Urology

## 2020-02-11 ENCOUNTER — Other Ambulatory Visit
Admission: RE | Admit: 2020-02-11 | Discharge: 2020-02-11 | Disposition: A | Discharge status: Home / Self Care | Payer: Medicare PPO | Source: Ambulatory Visit | Attending: Urology | Admitting: Urology

## 2020-02-11 VITALS — BP 128/69 | HR 88 | Ht 66.0 in | Wt 163.0 lb

## 2020-02-11 DIAGNOSIS — N390 Urinary tract infection, site not specified: Secondary | ICD-10-CM

## 2020-02-11 DIAGNOSIS — N952 Postmenopausal atrophic vaginitis: Secondary | ICD-10-CM | POA: Diagnosis not present

## 2020-02-11 DIAGNOSIS — N3945 Continuous leakage: Secondary | ICD-10-CM | POA: Diagnosis not present

## 2020-02-11 LAB — URINALYSIS, COMPLETE (UACMP) WITH MICROSCOPIC
Bacteria, UA: NONE SEEN
Bilirubin Urine: NEGATIVE
Glucose, UA: NEGATIVE mg/dL
Hgb urine dipstick: NEGATIVE
Leukocytes,Ua: NEGATIVE
Nitrite: NEGATIVE
Protein, ur: NEGATIVE mg/dL
Specific Gravity, Urine: 1.025 (ref 1.005–1.030)
pH: 5 (ref 5.0–8.0)

## 2020-02-11 MED ORDER — MIRABEGRON ER 25 MG PO TB24: 25.0000 mg | ORAL_TABLET | Freq: Every day | ORAL | 12 refills | Status: DC

## 2020-02-11 MED ORDER — PREMARIN 0.625 MG/GM VA CREA: TOPICAL_CREAM | VAGINAL | 12 refills | Status: DC

## 2020-02-11 NOTE — Patient Instructions (Signed)
Using your finger-tip apply a pea-sized amount of the Premarin cream to the external vaginal area on Monday, Wednesday and Friday nights 

## 2020-02-12 LAB — URINE CULTURE: Culture: NO GROWTH

## 2020-02-14 ENCOUNTER — Telehealth: Payer: Self-pay | Admitting: Urology

## 2020-02-14 NOTE — Telephone Encounter (Signed)
Pt inquiring about her urine results left on Monday and expressed to please call her on her land/home line.

## 2020-02-14 NOTE — Telephone Encounter (Signed)
Please let Jordan Montoya know that are was clear with her urine.  Both the UA and urine culture were negative.

## 2020-02-14 NOTE — Telephone Encounter (Signed)
Spoke with patient and advised results   

## 2020-03-04 ENCOUNTER — Telehealth: Payer: Self-pay | Admitting: Internal Medicine

## 2020-03-04 NOTE — Telephone Encounter (Signed)
Patient called to speak with the nurse regarding some jaw bone pain she has been having.  She stated that there is no swelling or fever, but her jaw just hurts sometimes where she can't chew.  She did not want to make an appt. Until she speaks with the nurse.  Please call to discuss at [phone]

## 2020-03-04 NOTE — Telephone Encounter (Signed)
Called pt told her that Dr. Judithann Graves was out of the office this week that she could go to [facility] for her jaw pain. Told pt if she decided not to go to [facility] that she could give [place] a call Monday to schedule appt. to be seen. Pt said she would try to hold off and called Monday to schedule appt.  KP

## 2020-03-07 ENCOUNTER — Other Ambulatory Visit: Payer: Self-pay | Admitting: Internal Medicine

## 2020-03-17 DIAGNOSIS — E119 Type 2 diabetes mellitus without complications: Secondary | ICD-10-CM | POA: Diagnosis not present

## 2020-03-17 LAB — HM DIABETES EYE EXAM

## 2020-03-18 ENCOUNTER — Other Ambulatory Visit: Payer: Self-pay

## 2020-03-18 ENCOUNTER — Telehealth: Payer: Self-pay | Admitting: Urology

## 2020-03-18 ENCOUNTER — Ambulatory Visit (INDEPENDENT_AMBULATORY_CARE_PROVIDER_SITE_OTHER): Payer: Medicare PPO | Admitting: Urology

## 2020-03-18 ENCOUNTER — Other Ambulatory Visit: Payer: Self-pay | Admitting: *Deleted

## 2020-03-18 ENCOUNTER — Encounter: Payer: Self-pay | Admitting: Urology

## 2020-03-18 ENCOUNTER — Other Ambulatory Visit
Admission: RE | Admit: 2020-03-18 | Discharge: 2020-03-18 | Disposition: A | Discharge status: Home / Self Care | Payer: Medicare PPO | Attending: Urology | Admitting: Urology

## 2020-03-18 VITALS — BP 129/70 | HR 81 | Ht 66.0 in | Wt 160.0 lb

## 2020-03-18 DIAGNOSIS — N3945 Continuous leakage: Secondary | ICD-10-CM | POA: Insufficient documentation

## 2020-03-18 DIAGNOSIS — N390 Urinary tract infection, site not specified: Secondary | ICD-10-CM

## 2020-03-18 LAB — URINALYSIS, COMPLETE (UACMP) WITH MICROSCOPIC
Bacteria, UA: NONE SEEN
Bilirubin Urine: NEGATIVE
Glucose, UA: NEGATIVE mg/dL
Nitrite: NEGATIVE
Protein, ur: >300 mg/dL — AB
Specific Gravity, Urine: >1.03 — ABNORMAL HIGH (ref 1.005–1.030)
Squamous Epithelial / HPF: NONE SEEN (ref 0–5)
WBC, UA: >50 WBC/hpf (ref 0–5)
pH: 5.5 (ref 5.0–8.0)

## 2020-03-18 MED ORDER — NITROFURANTOIN MACROCRYSTAL 50 MG PO CAPS: 50.0000 mg | ORAL_CAPSULE | Freq: Every day | ORAL | 5 refills | Status: DC

## 2020-03-18 MED ORDER — CIPROFLOXACIN HCL 500 MG PO TABS: 500.0000 mg | ORAL_TABLET | Freq: Two times a day (BID) | ORAL | 0 refills | Status: DC

## 2020-03-18 MED ORDER — NITROFURANTOIN MONOHYD MACRO 100 MG PO CAPS: 100.0000 mg | ORAL_CAPSULE | Freq: Two times a day (BID) | ORAL | 0 refills | Status: DC

## 2020-03-18 MED ORDER — CIPROFLOXACIN HCL 500 MG PO TABS
500.0000 mg | ORAL_TABLET | Freq: Once | ORAL | Status: AC
  Administered 2020-03-18: 500 mg via ORAL

## 2020-03-18 NOTE — Addendum Note (Signed)
Addended by: Sueanne Margarita on: 03/18/2020 02:04 PM   Modules accepted: Orders

## 2020-03-18 NOTE — Progress Notes (Signed)
   03/18/2020 1:30 PM   Jordan Montoya 1934-03-31 [id number]  Reason for visit: UTI symptoms  HPI: I saw [name] as an add-on in urology clinic today for UTI symptoms.  She previously has been seen by Michiel Cowboy and Vanna Scotland.  She was diagnosed with E. coli UTIs in March and June 2021 that presented with symptoms of dysuria, pelvic pain, urgency, and incontinence.  Most recently she was started on Premarin cream, cranberry tablet prophylaxis, and Myrbetriq.  She reports 4 to 5 days of worsening urinary symptoms including urgency, incontinence, and dysuria consistent with prior UTI symptoms.  Urinalysis today is concerning for infection with 0 squamous cells, greater than 50 WBCs, 6-10 RBCs, no bacteria, small leukocytes, nitrite negative.  Sent for culture.  I recommended Cipro 500 mg twice daily x5 days for suspected UTI, will call with culture results  We discussed UTI prevention strategies at length, and I recommended continuing Premarin cream and cranberry tablet prophylaxis, and transitioning to 50 mg daily Macrobid for 6 months for UTI prevention after completing the Cipro.  She has follow-up in 2 months with Michiel Cowboy.   Sondra Come, MD  [facility] [address] [place], [place] [address] [phone]

## 2020-03-18 NOTE — Addendum Note (Signed)
Addended by: Sueanne Margarita on: 03/18/2020 02:35 PM   Modules accepted: Orders

## 2020-03-20 ENCOUNTER — Encounter: Payer: Self-pay | Admitting: Internal Medicine

## 2020-03-20 NOTE — Telephone Encounter (Signed)
error 

## 2020-04-01 NOTE — Progress Notes (Signed)
Date:  04/02/2020   Name:  Jordan Montoya   DOB:  04/12/1934   MRN:  [id number]   Chief Complaint: Annual Exam (breast exam no pap)  [name] is a 86 y.o. female who presents today for her Complete Annual Exam. She feels fairly well. She reports exercising none. She reports she is sleeping well. Breast complaints none.  She is still having back pain and neuropathic pain from shingles.  Mammogram: 05/2019 DEXA: 2012 Colonoscopy: aged out; last done 2004  Immunization History  Administered Date(s) Administered  . Fluad Quad(high Dose 65+) 05/24/2019  . Influenza, High Dose Seasonal PF 05/01/2018  . Influenza-Unspecified 05/04/2016, 05/24/2017, 05/04/2018  . Moderna SARS-COVID-2 Vaccination 10/01/2019, 10/29/2019  . Pneumococcal Conjugate-13 06/06/2014  . Pneumococcal Polysaccharide-23 06/16/2017  . Tdap 08/20/2011  . Zoster Recombinat (Shingrix) 12/06/2019, 03/03/2020    Diabetes She presents for her follow-up diabetic visit. She has type 2 diabetes mellitus. Her disease course has been stable. Hypoglycemia symptoms include dizziness (due to sinuses) and nervousness/anxiousness. Pertinent negatives for hypoglycemia include no headaches or tremors. Pertinent negatives for diabetes include no chest pain, no fatigue, no polydipsia and no polyuria. Current diabetic treatment includes oral agent (monotherapy) Jordan Montoya(januvia). She is compliant with treatment all of the time.  Back Pain This is a chronic problem. The problem occurs daily. The problem has been gradually worsening since onset. The pain is present in the lumbar spine. The pain radiates to the left knee and right knee. The pain is moderate. Associated symptoms include dysuria (frequent uti). Pertinent negatives include no abdominal pain, chest pain, fever or headaches. Treatments tried: taking tylenol, lyrica and sometimes tramadol. The treatment provided moderate relief.  Anxiety Presents for follow-up visit. Symptoms  include dizziness (due to sinuses) and nervous/anxious behavior. Patient reports no chest pain, palpitations or shortness of breath. Symptoms occur most days. The severity of symptoms is moderate.   Compliance with medications is 76-100% (takes xanax 1/2 tablet as needed - ).  PHN - ongoing pain at the site of previous shingles outbreak.  I suspect this will be prolonged.  She has moderate relief with Lyrica 100 mg bid.  She takes Tramadol only rarely is she has more demanding activities on a given day.  Lab Results  Component Value Date   CREATININE 0.79 11/27/2019   BUN 23 11/27/2019   NA 143 11/27/2019   K 4.5 11/27/2019   CL 107 (H) 11/27/2019   CO2 23 11/27/2019   Lab Results  Component Value Date   CHOL 165 11/27/2019   HDL 59 11/27/2019   LDLCALC 79 11/27/2019   TRIG 158 (H) 11/27/2019   CHOLHDL 2.8 11/27/2019   Lab Results  Component Value Date   TSH 2.870 11/27/2019   Lab Results  Component Value Date   HGBA1C 6.0 (H) 11/27/2019   Lab Results  Component Value Date   WBC 7.2 04/13/2019   HGB 13.0 04/13/2019   HCT 39.2 04/13/2019   MCV 94.0 04/13/2019   PLT 224 04/13/2019   Lab Results  Component Value Date   ALT 12 11/27/2019   AST 16 11/27/2019   ALKPHOS 101 11/27/2019   BILITOT 0.3 11/27/2019     Review of Systems  Constitutional: Negative for chills, fatigue and fever.  HENT: Positive for congestion. Negative for hearing loss, tinnitus, trouble swallowing and voice change.   Eyes: Negative for visual disturbance.  Respiratory: Negative for cough, chest tightness, shortness of breath and wheezing.   Cardiovascular: Negative for  chest pain, palpitations and leg swelling.  Gastrointestinal: Positive for constipation (takes stool softeners). Negative for abdominal pain, diarrhea and vomiting.  Endocrine: Negative for polydipsia and polyuria.  Genitourinary: Positive for dysuria (frequent uti). Negative for frequency, genital sores, vaginal bleeding and  vaginal discharge.  Musculoskeletal: Positive for back pain and myalgias. Negative for arthralgias, gait problem and joint swelling.  Skin: Negative for color change and rash.  Neurological: Positive for dizziness (due to sinuses). Negative for tremors, light-headedness and headaches.  Hematological: Negative for adenopathy. Does not bruise/bleed easily.  Psychiatric/Behavioral: Negative for dysphoric mood and sleep disturbance. The patient is nervous/anxious.     Patient Active Problem List   Diagnosis Date Noted  . Chronic pain syndrome 06/11/2019  . Post herpetic neuralgia 03/20/2019  . Spinal stenosis of lumbar region at multiple levels 03/16/2019  . Anxiety disorder 11/16/2018  . Primary osteoarthritis of both hands 08/16/2018  . Arthralgia of multiple sites 05/18/2018  . Pernicious anemia 12/29/2017  . Type II diabetes mellitus with complication (HCC) 09/30/2015  . Environmental and seasonal allergies 05/30/2015  . Hip pain, chronic 01/27/2015  . Avitaminosis D 11/25/2014  . Psoriasis 11/25/2014  . Hyperlipidemia associated with type 2 diabetes mellitus (HCC) 08/20/2011    Allergies  Allergen Reactions  . Metformin Nausea Only    Past Surgical History:  Procedure Laterality Date  . BREAST BIOPSY Left    neg-bx/clip  . BREAST CYST EXCISION Left    neg  . EYE SURGERY Bilateral    cataracts  . PARTIAL HYSTERECTOMY     one ovary remains    Social History   Tobacco Use  . Smoking status: Former Smoker    Packs/day: 0.25    Years: 20.00    Pack years: 5.00    Types: Cigarettes    Quit date: 10/10/1979    Years since quitting: 40.5  . Smokeless tobacco: Never Used  Vaping Use  . Vaping Use: Never used  Substance Use Topics  . Alcohol use: No    Alcohol/week: 0.0 standard drinks  . Drug use: No     Medication list has been reviewed and updated.  Current Meds  Medication Sig  . acetaminophen (TYLENOL) 650 MG CR tablet Take 650 mg by mouth every 8 (eight)  hours as needed for pain.   Marland Kitchen ALPRAZolam (XANAX) 0.25 MG tablet TAKE 1 TABLET BY MOUTH DAILY AS NEEDED FOR ANXIETY  . aspirin 325 MG EC tablet Take 325 mg by mouth daily. As needed  . conjugated estrogens (PREMARIN) vaginal cream Apply 0.5mg  (pea-sized amount)  just inside the vaginal introitus with a finger-tip on  Monday, Wednesday and Friday nights.  . COSENTYX SENSOREADY, 300 MG, 150 MG/ML SOAJ   . CRANBERRY PO Take 2 tablets by mouth daily.  Marland Kitchen docusate sodium (COLACE) 100 MG capsule Take 200 mg by mouth 2 (two) times daily as needed for mild constipation.  Marland Kitchen ibuprofen (ADVIL) 200 MG tablet Take 400 mg by mouth every 8 (eight) hours as needed.  Marland Kitchen JANUVIA 100 MG tablet TAKE 1 TABLET (100 MG TOTAL) BY MOUTH DAILY.  . metoprolol succinate (TOPROL-XL) 25 MG 24 hr tablet Take 12.5 mg by mouth daily.   . mirabegron ER (MYRBETRIQ) 25 MG TB24 tablet Take 1 tablet (25 mg total) by mouth daily.  . naproxen sodium (ALEVE) 220 MG tablet Take 220 mg by mouth.  . nitrofurantoin (MACRODANTIN) 50 MG capsule Take 1 capsule (50 mg total) by mouth daily. Take daily for UTI prevention after  completing course of Cipro  . pregabalin (LYRICA) 100 MG capsule Take 1 capsule (100 mg total) by mouth 2 (two) times daily.  . traMADol-acetaminophen (ULTRACET) 37.5-325 MG tablet Take 1 tablet by mouth 2 (two) times daily as needed.    PHQ 2/9 Scores 01/17/2020 01/02/2020 11/27/2019 08/28/2019  PHQ - 2 Score 0 0 0 0  PHQ- 9 Score 4 1 0 6    GAD 7 : Generalized Anxiety Score 01/17/2020 01/02/2020 05/01/2019 11/16/2018  Nervous, Anxious, on Edge 0 0 3 3  Control/stop worrying 0 0 3 0  Worry too much - different things 0 1 3 0  Trouble relaxing 0 0 1 3  Restless 0 0 0 3  Easily annoyed or irritable 0 0 0 0  Afraid - awful might happen 0 0 0 0  Total GAD 7 Score 0 1 10 9   Anxiety Difficulty - Not difficult at all Very difficult -    BP Readings from Last 3 Encounters:  04/02/20 140/72  03/18/20 129/70  02/11/20 128/69     Physical Exam Vitals and nursing note reviewed.  Constitutional:      General: She is not in acute distress.    Appearance: She is well-developed.  HENT:     Head: Normocephalic and atraumatic.     Right Ear: Tympanic membrane and ear canal normal.     Left Ear: Tympanic membrane and ear canal normal.     Nose:     Right Sinus: No maxillary sinus tenderness.     Left Sinus: No maxillary sinus tenderness.  Eyes:     General: No scleral icterus.       Right eye: No discharge.        Left eye: No discharge.     Conjunctiva/sclera: Conjunctivae normal.  Neck:     Thyroid: No thyromegaly.     Vascular: No carotid bruit.  Cardiovascular:     Rate and Rhythm: Normal rate and regular rhythm.     Pulses: Normal pulses.     Heart sounds: Normal heart sounds.  Pulmonary:     Effort: Pulmonary effort is normal. No respiratory distress.     Breath sounds: No wheezing.  Chest:     Breasts:        Right: No mass, nipple discharge, skin change or tenderness.        Left: No mass, nipple discharge, skin change or tenderness.  Abdominal:     General: Bowel sounds are normal.     Palpations: Abdomen is soft.     Tenderness: There is no abdominal tenderness.  Musculoskeletal:        General: No swelling or tenderness. Normal range of motion.     Cervical back: Normal range of motion. No erythema.     Right lower leg: No edema.     Left lower leg: No edema.  Lymphadenopathy:     Cervical: No cervical adenopathy.  Skin:    General: Skin is warm and dry.     Capillary Refill: Capillary refill takes less than 2 seconds.     Findings: No rash.  Neurological:     General: No focal deficit present.     Mental Status: She is alert and oriented to person, place, and time.     Cranial Nerves: No cranial nerve deficit.     Sensory: Sensation is intact. No sensory deficit.     Motor: Motor function is intact.     Gait: Gait abnormal (uses walker).  Deep Tendon Reflexes: Reflexes are  normal and symmetric.  Psychiatric:        Attention and Perception: Attention normal.        Mood and Affect: Mood is depressed.        Speech: Speech normal.        Behavior: Behavior normal.        Thought Content: Thought content does not include suicidal ideation. Thought content does not include suicidal plan.     Wt Readings from Last 3 Encounters:  04/02/20 164 lb (74.4 kg)  03/18/20 160 lb (72.6 kg)  02/11/20 163 lb (73.9 kg)    BP 140/72   Pulse 68   Temp 98 F (36.7 C) (Oral)   Ht 5\' 6"  (1.676 m)   Wt 164 lb (74.4 kg)   SpO2 95%   BMI 26.47 kg/m   Assessment and Plan: 1. Annual physical exam - POCT urinalysis dipstick  2. Encounter for screening mammogram for breast cancer Schedule at [facility] in October - MM 3D SCREEN BREAST BILATERAL; Future  3. Encounter for screening for osteoporosis Due for repeat test - DG Bone Density; Future  4. Type II diabetes mellitus with complication (HCC) Clinically stable by exam and report without s/s of hypoglycemia. DM complicated by lipids. Tolerating medications - Januvia alone -  well without side effects or other concerns. - CBC with Differential/Platelet - Comprehensive metabolic panel - Hemoglobin A1c - TSH  5. Hyperlipidemia associated with type 2 diabetes mellitus (HCC) Not on statin therapy - Lipid panel  6. Post herpetic neuralgia Course has been prolonged and complicated by spinal stenosis pain Continue Lyrica 100 mg bid  7. Generalized anxiety disorder Stable chronic symptoms worsened since prolonged recovery from Shingles and ongoing spinal stenosis pain - ALPRAZolam (XANAX) 0.25 MG tablet; Take 1 tablet (0.25 mg total) by mouth daily as needed. for anxiety  Dispense: 30 tablet; Refill: 5  8. Spinal stenosis of lumbar region at multiple levels Recommend follow up with Ortho for repeat ESI   Partially dictated using Dragon software. Any errors are unintentional.  November, MD [facility]  04/02/2020

## 2020-04-02 ENCOUNTER — Other Ambulatory Visit: Payer: Self-pay

## 2020-04-02 ENCOUNTER — Ambulatory Visit (INDEPENDENT_AMBULATORY_CARE_PROVIDER_SITE_OTHER): Payer: Medicare PPO | Admitting: Internal Medicine

## 2020-04-02 ENCOUNTER — Encounter: Payer: Self-pay | Admitting: Internal Medicine

## 2020-04-02 VITALS — BP 140/72 | HR 68 | Temp 98.0°F | Ht 66.0 in | Wt 164.0 lb

## 2020-04-02 DIAGNOSIS — Z1231 Encounter for screening mammogram for malignant neoplasm of breast: Secondary | ICD-10-CM

## 2020-04-02 DIAGNOSIS — F411 Generalized anxiety disorder: Secondary | ICD-10-CM | POA: Diagnosis not present

## 2020-04-02 DIAGNOSIS — E118 Type 2 diabetes mellitus with unspecified complications: Secondary | ICD-10-CM | POA: Diagnosis not present

## 2020-04-02 DIAGNOSIS — B0229 Other postherpetic nervous system involvement: Secondary | ICD-10-CM

## 2020-04-02 DIAGNOSIS — Z0000 Encounter for general adult medical examination without abnormal findings: Secondary | ICD-10-CM | POA: Diagnosis not present

## 2020-04-02 DIAGNOSIS — Z13820 Encounter for screening for osteoporosis: Secondary | ICD-10-CM

## 2020-04-02 DIAGNOSIS — E1169 Type 2 diabetes mellitus with other specified complication: Secondary | ICD-10-CM

## 2020-04-02 DIAGNOSIS — E785 Hyperlipidemia, unspecified: Secondary | ICD-10-CM

## 2020-04-02 DIAGNOSIS — M48061 Spinal stenosis, lumbar region without neurogenic claudication: Secondary | ICD-10-CM

## 2020-04-02 LAB — POCT URINALYSIS DIPSTICK
Bilirubin, UA: NEGATIVE
Blood, UA: NEGATIVE
Glucose, UA: NEGATIVE
Ketones, UA: NEGATIVE
Leukocytes, UA: NEGATIVE
Nitrite, UA: NEGATIVE
Protein, UA: NEGATIVE
Spec Grav, UA: <=1.005 — AB (ref 1.010–1.025)
Urobilinogen, UA: 0.2 E.U./dL
pH, UA: 6.5 (ref 5.0–8.0)

## 2020-04-02 MED ORDER — ALPRAZOLAM 0.25 MG PO TABS: 0.2500 mg | ORAL_TABLET | Freq: Every day | ORAL | 5 refills | Status: DC | PRN

## 2020-04-03 LAB — COMPREHENSIVE METABOLIC PANEL
ALT: 10 IU/L (ref 0–32)
AST: 19 IU/L (ref 0–40)
Albumin/Globulin Ratio: 1.7 (ref 1.2–2.2)
Albumin: 4.4 g/dL (ref 3.6–4.6)
Alkaline Phosphatase: 94 IU/L (ref 48–121)
BUN/Creatinine Ratio: 26 (ref 12–28)
BUN: 17 mg/dL (ref 8–27)
Bilirubin Total: 0.3 mg/dL (ref 0.0–1.2)
CO2: 25 mmol/L (ref 20–29)
Calcium: 9.3 mg/dL (ref 8.7–10.3)
Chloride: 105 mmol/L (ref 96–106)
Creatinine, Ser: 0.66 mg/dL (ref 0.57–1.00)
GFR calc Af Amer: 94 mL/min/{1.73_m2} (ref >59)
GFR calc non Af Amer: 81 mL/min/{1.73_m2} (ref >59)
Globulin, Total: 2.6 g/dL (ref 1.5–4.5)
Glucose: 99 mg/dL (ref 65–99)
Potassium: 4.8 mmol/L (ref 3.5–5.2)
Sodium: 141 mmol/L (ref 134–144)
Total Protein: 7 g/dL (ref 6.0–8.5)

## 2020-04-03 LAB — CBC WITH DIFFERENTIAL/PLATELET
Basophils Absolute: 0 10*3/uL (ref 0.0–0.2)
Basos: 0 %
EOS (ABSOLUTE): 0.1 10*3/uL (ref 0.0–0.4)
Eos: 2 %
Hematocrit: 37 % (ref 34.0–46.6)
Hemoglobin: 11.9 g/dL (ref 11.1–15.9)
Immature Grans (Abs): 0 10*3/uL (ref 0.0–0.1)
Immature Granulocytes: 0 %
Lymphocytes Absolute: 2.1 10*3/uL (ref 0.7–3.1)
Lymphs: 38 %
MCH: 28.8 pg (ref 26.6–33.0)
MCHC: 32.2 g/dL (ref 31.5–35.7)
MCV: 90 fL (ref 79–97)
Monocytes Absolute: 0.4 10*3/uL (ref 0.1–0.9)
Monocytes: 8 %
Neutrophils Absolute: 2.9 10*3/uL (ref 1.4–7.0)
Neutrophils: 52 %
Platelets: 233 10*3/uL (ref 150–450)
RBC: 4.13 x10E6/uL (ref 3.77–5.28)
RDW: 13.6 % (ref 11.7–15.4)
WBC: 5.5 10*3/uL (ref 3.4–10.8)

## 2020-04-03 LAB — LIPID PANEL
Chol/HDL Ratio: 3.2 ratio (ref 0.0–4.4)
Cholesterol, Total: 215 mg/dL — ABNORMAL HIGH (ref 100–199)
HDL: 67 mg/dL (ref >39)
LDL Chol Calc (NIH): 119 mg/dL — ABNORMAL HIGH (ref 0–99)
Triglycerides: 165 mg/dL — ABNORMAL HIGH (ref 0–149)
VLDL Cholesterol Cal: 29 mg/dL (ref 5–40)

## 2020-04-03 LAB — TSH: TSH: 3.55 u[IU]/mL (ref 0.450–4.500)

## 2020-04-03 LAB — HEMOGLOBIN A1C
Est. average glucose Bld gHb Est-mCnc: 140 mg/dL
Hgb A1c MFr Bld: 6.5 % — ABNORMAL HIGH (ref 4.8–5.6)

## 2020-04-07 ENCOUNTER — Other Ambulatory Visit: Payer: Self-pay | Admitting: Internal Medicine

## 2020-04-11 DIAGNOSIS — Z79899 Other long term (current) drug therapy: Secondary | ICD-10-CM | POA: Diagnosis not present

## 2020-04-11 DIAGNOSIS — L570 Actinic keratosis: Secondary | ICD-10-CM | POA: Diagnosis not present

## 2020-04-11 DIAGNOSIS — L400 Psoriasis vulgaris: Secondary | ICD-10-CM | POA: Diagnosis not present

## 2020-04-14 ENCOUNTER — Other Ambulatory Visit: Payer: Self-pay

## 2020-04-14 ENCOUNTER — Other Ambulatory Visit
Admission: RE | Admit: 2020-04-14 | Discharge: 2020-04-14 | Disposition: A | Discharge status: Home / Self Care | Payer: Medicare PPO | Attending: Dermatology | Admitting: Dermatology

## 2020-04-14 DIAGNOSIS — L400 Psoriasis vulgaris: Secondary | ICD-10-CM | POA: Diagnosis not present

## 2020-04-14 LAB — COMPREHENSIVE METABOLIC PANEL
ALT: 15 U/L (ref 0–44)
AST: 19 U/L (ref 15–41)
Albumin: 4.1 g/dL (ref 3.5–5.0)
Alkaline Phosphatase: 76 U/L (ref 38–126)
Anion gap: 8 (ref 5–15)
BUN: 27 mg/dL — ABNORMAL HIGH (ref 8–23)
CO2: 26 mmol/L (ref 22–32)
Calcium: 8.8 mg/dL — ABNORMAL LOW (ref 8.9–10.3)
Chloride: 106 mmol/L (ref 98–111)
Creatinine, Ser: 0.9 mg/dL (ref 0.44–1.00)
GFR calc Af Amer: >60 mL/min (ref >60)
GFR calc non Af Amer: 59 mL/min — ABNORMAL LOW (ref >60)
Glucose, Bld: 169 mg/dL — ABNORMAL HIGH (ref 70–99)
Potassium: 4.6 mmol/L (ref 3.5–5.1)
Sodium: 140 mmol/L (ref 135–145)
Total Bilirubin: 0.3 mg/dL (ref 0.3–1.2)
Total Protein: 7.6 g/dL (ref 6.5–8.1)

## 2020-04-14 LAB — CBC WITH DIFFERENTIAL/PLATELET
Abs Immature Granulocytes: 0.01 10*3/uL (ref 0.00–0.07)
Basophils Absolute: 0 10*3/uL (ref 0.0–0.1)
Basophils Relative: 0 %
Eosinophils Absolute: 0.1 10*3/uL (ref 0.0–0.5)
Eosinophils Relative: 2 %
HCT: 36.6 % (ref 36.0–46.0)
Hemoglobin: 12 g/dL (ref 12.0–15.0)
Immature Granulocytes: 0 %
Lymphocytes Relative: 32 %
Lymphs Abs: 1.6 10*3/uL (ref 0.7–4.0)
MCH: 29.2 pg (ref 26.0–34.0)
MCHC: 32.8 g/dL (ref 30.0–36.0)
MCV: 89.1 fL (ref 80.0–100.0)
Monocytes Absolute: 0.5 10*3/uL (ref 0.1–1.0)
Monocytes Relative: 10 %
Neutro Abs: 2.8 10*3/uL (ref 1.7–7.7)
Neutrophils Relative %: 56 %
Platelets: 219 10*3/uL (ref 150–400)
RBC: 4.11 MIL/uL (ref 3.87–5.11)
RDW: 13.7 % (ref 11.5–15.5)
WBC: 5.1 10*3/uL (ref 4.0–10.5)
nRBC: 0 % (ref 0.0–0.2)

## 2020-04-23 ENCOUNTER — Ambulatory Visit (INDEPENDENT_AMBULATORY_CARE_PROVIDER_SITE_OTHER): Payer: Medicare PPO

## 2020-04-23 DIAGNOSIS — Z0000 Encounter for general adult medical examination without abnormal findings: Secondary | ICD-10-CM

## 2020-04-23 NOTE — Patient Instructions (Signed)
Jordan Montoya , Thank you for taking time to come for your Medicare Wellness Visit. I appreciate your ongoing commitment to your health goals. Please review the following plan we discussed and let me know if I can assist you in the future.   Screening recommendations/referrals: Colonoscopy: no longer required Mammogram: done 05/30/19. Scheduled for 06/02/20 Bone Density: done 2012. Scheduled for 06/02/20 Recommended yearly ophthalmology/optometry visit for glaucoma screening and checkup Recommended yearly dental visit for hygiene and checkup  Vaccinations: Influenza vaccine: due Pneumococcal vaccine: done 06/16/17 Tdap vaccine: done 08/20/11 Shingles vaccine: done 12/06/19 & 03/03/20   Covid-19:done 10/01/19 & 10/29/19  Advanced directives: Please bring a copy of your health care power of attorney and living will to the office at your convenience.  Conditions/risks identified: Recommend drinking 6-8 glasses of water per day  Next appointment: Follow up in one year for your annual wellness visit    Preventive Care 65 Years and Older, Female Preventive care refers to lifestyle choices and visits with your health care provider that can promote health and wellness. What does preventive care include?  A yearly physical exam. This is also called an annual well check.  Dental exams once or twice a year.  Routine eye exams. Ask your health care provider how often you should have your eyes checked.  Personal lifestyle choices, including:  Daily care of your teeth and gums.  Regular physical activity.  Eating a healthy diet.  Avoiding tobacco and drug use.  Limiting alcohol use.  Practicing safe sex.  Taking low-dose aspirin every day.  Taking vitamin and mineral supplements as recommended by your health care provider. What happens during an annual well check? The services and screenings done by your health care provider during your annual well check will depend on your age, overall  health, lifestyle risk factors, and family history of disease. Counseling  Your health care provider may ask you questions about your:  Alcohol use.  Tobacco use.  Drug use.  Emotional well-being.  Home and relationship well-being.  Sexual activity.  Eating habits.  History of falls.  Memory and ability to understand (cognition).  Work and work Astronomer.  Reproductive health. Screening  You may have the following tests or measurements:  Height, weight, and BMI.  Blood pressure.  Lipid and cholesterol levels. These may be checked every 5 years, or more frequently if you are over 80 years old.  Skin check.  Lung cancer screening. You may have this screening every year starting at age 65 if you have a 30-pack-year history of smoking and currently smoke or have quit within the past 15 years.  Fecal occult blood test (FOBT) of the stool. You may have this test every year starting at age 41.  Flexible sigmoidoscopy or colonoscopy. You may have a sigmoidoscopy every 5 years or a colonoscopy every 10 years starting at age 6.  Hepatitis C blood test.  Hepatitis B blood test.  Sexually transmitted disease (STD) testing.  Diabetes screening. This is done by checking your blood sugar (glucose) after you have not eaten for a while (fasting). You may have this done every 1-3 years.  Bone density scan. This is done to screen for osteoporosis. You may have this done starting at age 29.  Mammogram. This may be done every 1-2 years. Talk to your health care provider about how often you should have regular mammograms. Talk with your health care provider about your test results, treatment options, and if necessary, the need for more tests.  Vaccines  Your health care provider may recommend certain vaccines, such as:  Influenza vaccine. This is recommended every year.  Tetanus, diphtheria, and acellular pertussis (Tdap, Td) vaccine. You may need a Td booster every 10  years.  Zoster vaccine. You may need this after age 37.  Pneumococcal 13-valent conjugate (PCV13) vaccine. One dose is recommended after age 77.  Pneumococcal polysaccharide (PPSV23) vaccine. One dose is recommended after age 22. Talk to your health care provider about which screenings and vaccines you need and how often you need them. This information is not intended to replace advice given to you by your health care provider. Make sure you discuss any questions you have with your health care provider. Document Released: 08/15/2015 Document Revised: 04/07/2016 Document Reviewed: 05/20/2015 Elsevier Interactive Patient Education  2017 [employer].  Fall Prevention in the Home Falls can cause injuries. They can happen to people of all ages. There are many things you can do to make your home safe and to help prevent falls. What can I do on the outside of my home?  Regularly fix the edges of walkways and driveways and fix any cracks.  Remove anything that might make you trip as you walk through a door, such as a raised step or threshold.  Trim any bushes or trees on the path to your home.  Use bright outdoor lighting.  Clear any walking paths of anything that might make someone trip, such as rocks or tools.  Regularly check to see if handrails are loose or broken. Make sure that both sides of any steps have handrails.  Any raised decks and porches should have guardrails on the edges.  Have any leaves, snow, or ice cleared regularly.  Use sand or salt on walking paths during winter.  Clean up any spills in your garage right away. This includes oil or grease spills. What can I do in the bathroom?  Use night lights.  Install grab bars by the toilet and in the tub and shower. Do not use towel bars as grab bars.  Use non-skid mats or decals in the tub or shower.  If you need to sit down in the shower, use a plastic, non-slip stool.  Keep the floor dry. Clean up any water that  spills on the floor as soon as it happens.  Remove soap buildup in the tub or shower regularly.  Attach bath mats securely with double-sided non-slip rug tape.  Do not have throw rugs and other things on the floor that can make you trip. What can I do in the bedroom?  Use night lights.  Make sure that you have a light by your bed that is easy to reach.  Do not use any sheets or blankets that are too big for your bed. They should not hang down onto the floor.  Have a firm chair that has side arms. You can use this for support while you get dressed.  Do not have throw rugs and other things on the floor that can make you trip. What can I do in the kitchen?  Clean up any spills right away.  Avoid walking on wet floors.  Keep items that you use a lot in easy-to-reach places.  If you need to reach something above you, use a strong step stool that has a grab bar.  Keep electrical cords out of the way.  Do not use floor polish or wax that makes floors slippery. If you must use wax, use non-skid floor wax.  Do not have throw rugs and other things on the floor that can make you trip. What can I do with my stairs?  Do not leave any items on the stairs.  Make sure that there are handrails on both sides of the stairs and use them. Fix handrails that are broken or loose. Make sure that handrails are as long as the stairways.  Check any carpeting to make sure that it is firmly attached to the stairs. Fix any carpet that is loose or worn.  Avoid having throw rugs at the top or bottom of the stairs. If you do have throw rugs, attach them to the floor with carpet tape.  Make sure that you have a light switch at the top of the stairs and the bottom of the stairs. If you do not have them, ask someone to add them for you. What else can I do to help prevent falls?  Wear shoes that:  Do not have high heels.  Have rubber bottoms.  Are comfortable and fit you well.  Are closed at the  toe. Do not wear sandals.  If you use a stepladder:  Make sure that it is fully opened. Do not climb a closed stepladder.  Make sure that both sides of the stepladder are locked into place.  Ask someone to hold it for you, if possible.  Clearly mark and make sure that you can see:  Any grab bars or handrails.  First and last steps.  Where the edge of each step is.  Use tools that help you move around (mobility aids) if they are needed. These include:  Canes.  Walkers.  Scooters.  Crutches.  Turn on the lights when you go into a dark area. Replace any light bulbs as soon as they burn out.  Set up your furniture so you have a clear path. Avoid moving your furniture around.  If any of your floors are uneven, fix them.  If there are any pets around you, be aware of where they are.  Review your medicines with your doctor. Some medicines can make you feel dizzy. This can increase your chance of falling. Ask your doctor what other things that you can do to help prevent falls. This information is not intended to replace advice given to you by your health care provider. Make sure you discuss any questions you have with your health care provider. Document Released: 05/15/2009 Document Revised: 12/25/2015 Document Reviewed: 08/23/2014 Elsevier Interactive Patient Education  2017 [employer].

## 2020-04-23 NOTE — Progress Notes (Signed)
Subjective:   Jordan Montoya is a 86 y.o. female who presents for Medicare Annual (Subsequent) preventive examination.  Virtual Visit via Telephone Note  I connected with  Jordan Montoya on 04/23/20 at  9:20 AM EDT by telephone and verified that I am speaking with the correct person using two identifiers.  Medicare Annual Wellness visit completed telephonically due to Covid-19 pandemic.   Location: Patient: home Provider: [facility]   I discussed the limitations, risks, security and privacy concerns of performing an evaluation and management service by telephone and the availability of in person appointments. The patient expressed understanding and agreed to proceed.  Unable to perform video visit due to video visit attempted and failed and/or patient does not have video capability.   Some vital signs may be absent or patient reported.   Reather Littler, LPN    Review of Systems     Cardiac Risk Factors include: advanced age (>74men, >34 women);diabetes mellitus;dyslipidemia     Objective:    Today's Vitals   04/23/20 0935  PainSc: 4    There is no height or weight on file to calculate BMI.  Advanced Directives 04/23/2020 04/13/2019 04/13/2019 02/28/2019 12/20/2018 12/09/2018 12/09/2018  Does Patient Have a Medical Advance Directive? Yes Yes Yes No No Yes Yes  Type of Estate agent of [place];Living will Healthcare Power of [place];Living will Healthcare Power of [place];Living will - Living will;Healthcare Power of Attorney Living will Living will  Does patient want to make changes to medical advance directive? - - - - - No - Patient declined -  Copy of Healthcare Power of Attorney in Chart? No - copy requested No - copy requested - - - - -  Would patient like information on creating a medical advance directive? - - - No - Patient declined - - -    Current Medications (verified) Outpatient Encounter Medications as of 04/23/2020  Medication Sig  .  acetaminophen (TYLENOL) 650 MG CR tablet Take 650 mg by mouth every 8 (eight) hours as needed for pain.   Marland Kitchen ALPRAZolam (XANAX) 0.25 MG tablet Take 1 tablet (0.25 mg total) by mouth daily as needed. for anxiety  . conjugated estrogens (PREMARIN) vaginal cream Apply 0.5mg  (pea-sized amount)  just inside the vaginal introitus with a finger-tip on  Monday, Wednesday and Friday nights.  . COSENTYX SENSOREADY, 300 MG, 150 MG/ML SOAJ   . CRANBERRY PO Take 2 tablets by mouth daily.  Marland Kitchen docusate sodium (COLACE) 100 MG capsule Take 200 mg by mouth 2 (two) times daily as needed for mild constipation.  Marland Kitchen ibuprofen (ADVIL) 200 MG tablet Take 400 mg by mouth every 8 (eight) hours as needed.  Marland Kitchen JANUVIA 100 MG tablet TAKE 1 TABLET (100 MG TOTAL) BY MOUTH DAILY.  . metoprolol tartrate (LOPRESSOR) 25 MG tablet Take 0.5 tablets by mouth daily.  . mirabegron ER (MYRBETRIQ) 25 MG TB24 tablet Take 1 tablet (25 mg total) by mouth daily.  . naproxen sodium (ALEVE) 220 MG tablet Take 220 mg by mouth.  . nitrofurantoin (MACRODANTIN) 50 MG capsule Take 1 capsule (50 mg total) by mouth daily. Take daily for UTI prevention after completing course of Cipro  . pregabalin (LYRICA) 100 MG capsule Take 1 capsule (100 mg total) by mouth 2 (two) times daily.  . Probiotic Product (PROBIOTIC-10 PO) Take by mouth.  . traMADol-acetaminophen (ULTRACET) 37.5-325 MG tablet Take 1 tablet by mouth 2 (two) times daily as needed.  Marland Kitchen aspirin 325 MG EC tablet Take  325 mg by mouth daily. As needed (Patient not taking: Reported on 04/23/2020)  . [DISCONTINUED] metoprolol succinate (TOPROL-XL) 25 MG 24 hr tablet Take 12.5 mg by mouth daily.    No facility-administered encounter medications on file as of 04/23/2020.    Allergies (verified) Metformin   History: Past Medical History:  Diagnosis Date  . Allergy   . Anxiety   . Avitaminosis D 11/25/2014  . Environmental and seasonal allergies 05/30/2015  . HLD (hyperlipidemia) 08/20/2011    Overview:  LDL goal <100 given DM2   . 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 Psoriasis 11/25/2014   followed by Dermatology   . Shingles 11/2018  . Type 2 diabetes mellitus with hyperosmolarity without coma, without long-term current use of insulin (HCC) 09/30/2015  . Unspecified septicemia(038.9) (HCC) 02/28/2019   Past Surgical History:  Procedure Laterality Date  . BREAST BIOPSY Left    neg-bx/clip  . BREAST CYST EXCISION Left    neg  . EYE SURGERY Bilateral    cataracts  . PARTIAL HYSTERECTOMY     one ovary remains   Family History  Problem Relation Age of Onset  . Cancer Mother   . Heart disease Father   . Diabetes Son   . Breast cancer Neg Hx    Social History   Socioeconomic History  . Marital status: Widowed    Spouse name: Not on file  . Number of children: 2  . Years of education: Not on file  . Highest education level: 11th grade  Occupational History  . Occupation: Retired  Tobacco Use  . Smoking status: Former Smoker    Packs/day: 0.25    Years: 20.00    Pack years: 5.00    Types: Cigarettes    Quit date: 10/10/1979    Years since quitting: 40.5  . Smokeless tobacco: Never Used  Vaping Use  . Vaping Use: Never used  Substance and Sexual Activity  . Alcohol use: No    Alcohol/week: 0.0 standard drinks  . Drug use: No  . Sexual activity: Not Currently  Other Topics Concern  . Not on file  Social History Narrative   Pt lives alone.    Social Determinants of Health   Financial Resource Strain: Low Risk   . Difficulty of Paying Living Expenses: Not hard at all  Food Insecurity: No Food Insecurity  . Worried About Programme researcher, broadcasting/film/video in the Last Year: Never true  . Ran Out of Food in the Last Year: Never true  Transportation Needs: No Transportation Needs  . Lack of Transportation (Medical): No  . Lack of Transportation (Non-Medical): No  Physical Activity: Inactive  . Days of Exercise per Week: 0  days  . Minutes of Exercise per Session: 0 min  Stress: Stress Concern Present  . Feeling of Stress : Rather much  Social Connections: Moderately Isolated  . Frequency of Communication with Friends and Family: More than three times a week  . Frequency of Social Gatherings with Friends and Family: Once a week  . Attends Religious Services: More than 4 times per year  . Active Member of Clubs or Organizations: No  . Attends Banker Meetings: Never  . Marital Status: Widowed    Tobacco Counseling Counseling given: Not Answered   Clinical Intake:  Pre-visit preparation completed: Yes  Pain : 0-10 Pain Score: 4  Pain Type: Chronic pain Pain Location: Back Pain Descriptors / Indicators: Aching, Discomfort, Sore Pain Onset:  More than a month ago Pain Frequency: Constant     Nutritional Risks: None Diabetes: Yes CBG done?: No Did pt. bring in CBG monitor from home?: No  How often do you need to have someone help you when you read instructions, pamphlets, or other written materials from your doctor or pharmacy?: 1 - Never  Nutrition Risk Assessment:  Has the patient had any N/V/D within the last 2 months?  No  Does the patient have any non-healing wounds?  No  Has the patient had any unintentional weight loss or weight gain?  No   Diabetes:  Is the patient diabetic?  Yes  If diabetic, was a CBG obtained today?  No  Did the patient bring in their glucometer from home?  No  How often do you monitor your CBG's? Every so often per patient today = 120. Marland Kitchen   Financial Strains and Diabetes Management:  Are you having any financial strains with the device, your supplies or your medication? No .  Does the patient want to be seen by Chronic Care Management for management of their diabetes?  No  Would the patient like to be referred to a Nutritionist or for Diabetic Management?  No   Diabetic Exams:  Diabetic Eye Exam: Completed 03/17/20 negative retinopathy.    Diabetic Foot Exam: Completed 11/27/19.   Interpreter Needed?: No  Information entered by :: Reather Littler LPN   Activities of Daily Living In your present state of health, do you have any difficulty performing the following activities: 04/23/2020 04/02/2020  Hearing? N N  Comment declines hearing aids -  Vision? N N  Difficulty concentrating or making decisions? N N  Walking or climbing stairs? Y Y  Dressing or bathing? N N  Doing errands, shopping? N N  Preparing Food and eating ? N -  Using the Toilet? N -  In the past six months, have you accidently leaked urine? Y -  Comment wears depends for protection -  Do you have problems with loss of bowel control? N -  Managing your Medications? N -  Managing your Finances? N -  Housekeeping or managing your Housekeeping? N -  Some recent data might be hidden    Patient Care Team: Reubin Milan, MD as PCP - General (Internal Medicine) Wanita Chamberlain, MD (Dermatology) Hildred Laser, MD as Referring Physician (Obstetrics and Gynecology) Estrella Deeds, MD as Referring Physician (Dermatology) Lamar Blinks, MD as Consulting Physician (Cardiology) Dayna Barker, MD as Referring Physician (Rheumatology)  Indicate any recent Medical Services you may have received from other than [facility] providers in the past year (date may be approximate).     Assessment:   This is a routine wellness examination for Gara.  Hearing/Vision screen  Hearing Screening   125Hz  250Hz  500Hz  1000Hz  2000Hz  3000Hz  4000Hz  6000Hz  8000Hz   Right ear:           Left ear:           Comments: Pt denies hearing difficulty   Vision Screening Comments: Annual vision screenings done at [facility]  Dietary issues and exercise activities discussed: Current Exercise Habits: The patient does not participate in regular exercise at present, Exercise limited by: orthopedic condition(s)  Goals    . DIET - INCREASE WATER INTAKE      Recommend drinking 6-8 glasses of water per day    . Exercise 150 minutes per week (moderate activity)     Recommended to attend [place]  Depression Screen PHQ 2/9 Scores 04/23/2020 01/17/2020 01/02/2020 11/27/2019 08/28/2019 05/01/2019 03/16/2019  PHQ - 2 Score 0 0 0 0 0 0 5  PHQ- 9 Score - 4 1 0 Fall Risk Fall Risk  04/23/2020 01/17/2020 01/02/2020 11/27/2019 06/11/2019  Falls in the past year? 0 1 0 0 0  Number falls in past yr: 0 0 0 0 -  Injury with Fall? 0 0 0 0 -  Risk for fall due to : Impaired balance/gait;Impaired mobility History of fall(s) - Impaired balance/gait;History of fall(s) -  Follow up Falls prevention discussed - Falls evaluation completed Falls evaluation completed -    Any stairs in or around the home? Yes  If so, are there any without handrails? No  Home free of loose throw rugs in walkways, pet beds, electrical cords, etc? Yes  Adequate lighting in your home to reduce risk of falls? Yes   ASSISTIVE DEVICES UTILIZED TO PREVENT FALLS:  Life alert? Yes  Use of a cane, walker or w/c? Yes  Grab bars in the bathroom? Yes  Shower chair or bench in shower? Yes  Elevated toilet seat or a handicapped toilet? Yes   TIMED UP AND GO:  Was the test performed? No . Telephonic visit   Cognitive Function:     6CIT Screen 04/23/2020 10/16/2018 06/16/2017  What Year? 0 points 0 points 0 points  What month? 0 points 0 points 0 points  What time? 0 points 0 points 0 points  Count back from 20 0 points 0 points 0 points  Months in reverse 0 points 0 points 0 points  Repeat phrase 0 points 0 points 4 points  Total Score 0 0 4    Immunizations Immunization History  Administered Date(s) Administered  . Fluad Quad(high Dose 65+) 05/24/2019  . Influenza, High Dose Seasonal PF 05/01/2018  . Influenza-Unspecified 05/04/2016, 05/24/2017, 05/04/2018  . Moderna SARS-COVID-2 Vaccination 10/01/2019, 10/29/2019  . Pneumococcal Conjugate-13 06/06/2014  .  Pneumococcal Polysaccharide-23 06/16/2017  . Tdap 08/20/2011  . Zoster Recombinat (Shingrix) 12/06/2019, 03/03/2020    TDAP status: Up to date   Flu Vaccine status: Declined, Education has been provided regarding the importance of this vaccine but patient still declined. Advised may receive this vaccine at local pharmacy or Health Dept. Aware to provide a copy of the vaccination record if obtained from local pharmacy or Health Dept. Verbalized acceptance and understanding.   Pneumococcal vaccine status: Up to date   Covid-19 vaccine status: Completed vaccines  Qualifies for Shingles Vaccine? Yes   Zostavax completed Yes   Shingrix Completed?: Yes  Screening Tests Health Maintenance  Topic Date Due  . INFLUENZA VACCINE  03/02/2020  . HEMOGLOBIN A1C  09/30/2020  . FOOT EXAM  11/26/2020  . URINE MICROALBUMIN  11/26/2020  . OPHTHALMOLOGY EXAM  03/17/2021  . TETANUS/TDAP  08/19/2021  . DEXA SCAN  Completed  . COVID-19 Vaccine  Completed  . PNA vac Low Risk Adult  Addressed    Health Maintenance  Health Maintenance Due  Topic Date Due  . INFLUENZA VACCINE  03/02/2020    Colorectal cancer screening: No longer required.    Mammogram status: Completed 05/30/19. Repeat every year. Scheduled for 06/02/20.  Bone Density screening status: Completed 2012. Results reflect unavailable. Scheduled for 06/02/20  Lung Cancer Screening: (Low Dose CT Chest recommended if Age 39-80 years, 30 pack-year currently smoking OR have quit w/in 15years.) does not qualify.    Additional Screening:  Hepatitis C Screening: does  not   Vision Screening: Recommended annual ophthalmology exams for early detection of glaucoma and other disorders of the eye. Is the patient up to date with their annual eye exam?  Yes  Who is the provider or what [facility] the [facility] of the [facility] which the [facility]  Dental Screening: Recommended annual dental exams for proper oral  hygiene  Community Resource Referral / Chronic Care Management: CRR required this visit?  No   CCM required this visit?  No      Plan:     I have personally reviewed and noted the following in the patient's chart:   . Medical and social history . Use of alcohol, tobacco or illicit drugs  . Current medications and supplements . Functional ability and status . Nutritional status . Physical activity . Advanced directives . List of other physicians . Hospitalizations, surgeries, and ER visits in previous 12 months . Vitals . Screenings to include cognitive, depression, and falls . Referrals and appointments  In addition, I have reviewed and discussed with patient certain preventive protocols, quality metrics, and best practice recommendations. A written personalized care plan for preventive services as well as general preventive health recommendations were provided to patient.     Reather LittlerKasey Ardene Remley, LPN   1/61/09609/22/2021   Nurse Notes: none

## 2020-05-19 ENCOUNTER — Ambulatory Visit: Payer: Medicare PPO | Admitting: Urology

## 2020-05-19 DIAGNOSIS — N390 Urinary tract infection, site not specified: Secondary | ICD-10-CM

## 2020-05-19 DIAGNOSIS — B0229 Other postherpetic nervous system involvement: Secondary | ICD-10-CM | POA: Diagnosis not present

## 2020-05-19 DIAGNOSIS — M48061 Spinal stenosis, lumbar region without neurogenic claudication: Secondary | ICD-10-CM | POA: Diagnosis not present

## 2020-05-19 DIAGNOSIS — M5416 Radiculopathy, lumbar region: Secondary | ICD-10-CM | POA: Diagnosis not present

## 2020-05-19 DIAGNOSIS — N3945 Continuous leakage: Secondary | ICD-10-CM

## 2020-05-19 DIAGNOSIS — N952 Postmenopausal atrophic vaginitis: Secondary | ICD-10-CM

## 2020-05-26 DIAGNOSIS — M5416 Radiculopathy, lumbar region: Secondary | ICD-10-CM | POA: Diagnosis not present

## 2020-05-26 DIAGNOSIS — M5136 Other intervertebral disc degeneration, lumbar region: Secondary | ICD-10-CM | POA: Diagnosis not present

## 2020-06-02 ENCOUNTER — Other Ambulatory Visit: Payer: Self-pay

## 2020-06-02 ENCOUNTER — Ambulatory Visit
Admission: RE | Admit: 2020-06-02 | Discharge: 2020-06-02 | Disposition: A | Discharge status: Home / Self Care | Payer: Medicare PPO | Source: Ambulatory Visit | Attending: Internal Medicine | Admitting: Internal Medicine

## 2020-06-02 DIAGNOSIS — Z780 Asymptomatic menopausal state: Secondary | ICD-10-CM | POA: Diagnosis not present

## 2020-06-02 DIAGNOSIS — M810 Age-related osteoporosis without current pathological fracture: Secondary | ICD-10-CM | POA: Insufficient documentation

## 2020-06-02 DIAGNOSIS — Z1231 Encounter for screening mammogram for malignant neoplasm of breast: Secondary | ICD-10-CM | POA: Diagnosis not present

## 2020-06-02 DIAGNOSIS — Z13820 Encounter for screening for osteoporosis: Secondary | ICD-10-CM | POA: Insufficient documentation

## 2020-06-02 DIAGNOSIS — R29890 Loss of height: Secondary | ICD-10-CM | POA: Diagnosis not present

## 2020-06-02 DIAGNOSIS — Z872 Personal history of diseases of the skin and subcutaneous tissue: Secondary | ICD-10-CM | POA: Diagnosis not present

## 2020-06-02 DIAGNOSIS — Z8739 Personal history of other diseases of the musculoskeletal system and connective tissue: Secondary | ICD-10-CM | POA: Diagnosis not present

## 2020-06-04 ENCOUNTER — Encounter: Payer: Self-pay | Admitting: Internal Medicine

## 2020-06-04 DIAGNOSIS — M810 Age-related osteoporosis without current pathological fracture: Secondary | ICD-10-CM | POA: Insufficient documentation

## 2020-06-09 ENCOUNTER — Other Ambulatory Visit: Payer: Self-pay | Admitting: Internal Medicine

## 2020-06-09 DIAGNOSIS — B029 Zoster without complications: Secondary | ICD-10-CM

## 2020-06-09 NOTE — Telephone Encounter (Signed)
Requested medication (s) are due for refill today: yes  Requested medication (s) are on the active medication list: yes  Last refill:  12/03/19  Future visit scheduled: yes  Notes to clinic:  med not delegated to NT to RF   Requested Prescriptions  Pending Prescriptions Disp Refills   pregabalin (LYRICA) 100 MG capsule [Pharmacy Med Name: PREGABALIN 100 MG CAPSULE] 60 capsule     Sig: TAKE 1 CAPSULE BY MOUTH TWICE A DAY      Not Delegated - Neurology:  Anticonvulsants - Controlled Failed - 06/09/2020  9:41 AM      Failed - This refill cannot be delegated      Passed - Valid encounter within last 12 months    Recent Outpatient Visits           2 months ago Annual physical exam   [facility] Reubin Milan, MD   4 months ago Acute cystitis without hematuria   [facility] Reubin Milan, MD   5 months ago Acute cystitis without hematuria   [facility] Reubin Milan, MD   6 months ago Type II diabetes mellitus with complication [facility])   [facility] Reubin Milan, MD   9 months ago Urinary frequency   [facility] Reubin Milan, MD       Future Appointments             In 10 months Judithann Graves Nyoka Cowden, MD [facility], [facility]

## 2020-06-10 NOTE — Telephone Encounter (Signed)
Please Advise. Last office visit 9/1/2.  KP

## 2020-06-10 NOTE — Telephone Encounter (Signed)
04/02/2020

## 2020-09-08 ENCOUNTER — Other Ambulatory Visit: Payer: Self-pay | Admitting: Urology

## 2020-10-08 ENCOUNTER — Other Ambulatory Visit: Payer: Self-pay | Admitting: Urology

## 2020-10-09 DIAGNOSIS — B0229 Other postherpetic nervous system involvement: Secondary | ICD-10-CM | POA: Diagnosis not present

## 2020-10-09 DIAGNOSIS — Z872 Personal history of diseases of the skin and subcutaneous tissue: Secondary | ICD-10-CM | POA: Diagnosis not present

## 2020-10-09 DIAGNOSIS — Z79899 Other long term (current) drug therapy: Secondary | ICD-10-CM | POA: Diagnosis not present

## 2020-10-09 DIAGNOSIS — L578 Other skin changes due to chronic exposure to nonionizing radiation: Secondary | ICD-10-CM | POA: Diagnosis not present

## 2020-10-09 DIAGNOSIS — C44319 Basal cell carcinoma of skin of other parts of face: Secondary | ICD-10-CM | POA: Diagnosis not present

## 2020-10-09 DIAGNOSIS — D485 Neoplasm of uncertain behavior of skin: Secondary | ICD-10-CM | POA: Diagnosis not present

## 2020-10-09 DIAGNOSIS — L400 Psoriasis vulgaris: Secondary | ICD-10-CM | POA: Diagnosis not present

## 2020-10-09 DIAGNOSIS — L570 Actinic keratosis: Secondary | ICD-10-CM | POA: Diagnosis not present

## 2020-10-17 DIAGNOSIS — L400 Psoriasis vulgaris: Secondary | ICD-10-CM | POA: Diagnosis not present

## 2020-10-20 ENCOUNTER — Other Ambulatory Visit: Payer: Self-pay | Admitting: Internal Medicine

## 2020-10-20 DIAGNOSIS — F411 Generalized anxiety disorder: Secondary | ICD-10-CM

## 2020-10-20 NOTE — Telephone Encounter (Signed)
Requested medication (s) are due for refill today: yes  Requested medication (s) are on the active medication list: yes  Last refill: 09/16/2020  Future visit scheduled: yes  Notes to clinic:  this refill cannot be delegated    Requested Prescriptions  Pending Prescriptions Disp Refills   ALPRAZolam (XANAX) 0.25 MG tablet [Pharmacy Med Name: ALPRAZOLAM 0.25 MG TABLET] 30 tablet     Sig: Take 1 tablet (0.25 mg total) by mouth daily as needed. for anxiety      Not Delegated - Psychiatry:  Anxiolytics/Hypnotics Failed - 10/20/2020 10:38 AM      Failed - This refill cannot be delegated      Failed - Urine Drug Screen completed in last 360 days      Failed - Valid encounter within last 6 months    Recent Outpatient Visits           6 months ago Annual physical exam   [facility] Reubin Milan, MD   9 months ago Acute cystitis without hematuria   [facility] Reubin Milan, MD   9 months ago Acute cystitis without hematuria   [facility] Reubin Milan, MD   10 months ago Type II diabetes mellitus with complication [facility])   [facility] Reubin Milan, MD   1 year ago Urinary frequency   [facility] Reubin Milan, MD       Future Appointments             In 5 months Judithann Graves Nyoka Cowden, MD [facility], [facility]

## 2020-10-20 NOTE — Telephone Encounter (Signed)
Please review. Last office visit 04/02/2020.  KP

## 2020-10-23 ENCOUNTER — Ambulatory Visit
Admission: RE | Admit: 2020-10-23 | Discharge: 2020-10-23 | Disposition: A | Discharge status: Home / Self Care | Payer: Medicare PPO | Attending: Internal Medicine | Admitting: Internal Medicine

## 2020-10-23 ENCOUNTER — Ambulatory Visit: Payer: Medicare PPO | Admitting: Internal Medicine

## 2020-10-23 ENCOUNTER — Ambulatory Visit
Admission: RE | Admit: 2020-10-23 | Discharge: 2020-10-23 | Disposition: A | Discharge status: Home / Self Care | Payer: Medicare PPO | Source: Ambulatory Visit | Attending: Internal Medicine | Admitting: Internal Medicine

## 2020-10-23 ENCOUNTER — Other Ambulatory Visit: Payer: Self-pay

## 2020-10-23 ENCOUNTER — Encounter: Payer: Self-pay | Admitting: Internal Medicine

## 2020-10-23 VITALS — BP 130/80 | HR 70 | Ht 66.0 in | Wt 163.0 lb

## 2020-10-23 DIAGNOSIS — G8929 Other chronic pain: Secondary | ICD-10-CM

## 2020-10-23 DIAGNOSIS — M25561 Pain in right knee: Secondary | ICD-10-CM | POA: Diagnosis not present

## 2020-10-23 DIAGNOSIS — E785 Hyperlipidemia, unspecified: Secondary | ICD-10-CM | POA: Diagnosis not present

## 2020-10-23 DIAGNOSIS — E1169 Type 2 diabetes mellitus with other specified complication: Secondary | ICD-10-CM | POA: Diagnosis not present

## 2020-10-23 DIAGNOSIS — E118 Type 2 diabetes mellitus with unspecified complications: Secondary | ICD-10-CM

## 2020-10-23 DIAGNOSIS — B0229 Other postherpetic nervous system involvement: Secondary | ICD-10-CM

## 2020-10-23 LAB — POCT GLYCOSYLATED HEMOGLOBIN (HGB A1C): Hemoglobin A1C: 6.1 % — AB (ref 4.0–5.6)

## 2020-10-23 LAB — POCT UA - MICROALBUMIN: Microalbumin Ur, POC: 20 mg/L

## 2020-10-23 NOTE — Progress Notes (Signed)
Date:  10/23/2020   Name:  Jordan Montoya   DOB:  10-28-1933   MRN:  [id number]   Chief Complaint: Diabetes (Follow up.Patient came today with her neighbor Jordan Montoya. )  Diabetes She presents for her follow-up diabetic visit. She has type 2 diabetes mellitus. Her disease course has been stable. Pertinent negatives for hypoglycemia include no headaches or tremors. Pertinent negatives for diabetes include no chest pain, no fatigue, no polydipsia and no polyuria. Current diabetic treatment includes oral agent (monotherapy) (Januvia). She is compliant with treatment all of the time. An ACE inhibitor/angiotensin II receptor blocker is not being taken. Eye exam is current.  Hyperlipidemia This is a chronic problem. The problem is uncontrolled. Associated symptoms include myalgias. Pertinent negatives include no chest pain or shortness of breath. She is currently on no antihyperlipidemic treatment.  Knee Pain  There was no injury mechanism. The pain is present in the left knee and right knee. The quality of the pain is described as aching and shooting. The pain is moderate. The pain has been fluctuating since onset. Associated symptoms include an inability to bear weight. Pertinent negatives include no numbness. Associated symptoms comments: Likely due to psoriatic arthritis.    Lab Results  Component Value Date   CREATININE 0.90 04/14/2020   BUN 27 (H) 04/14/2020   NA 140 04/14/2020   K 4.6 04/14/2020   CL 106 04/14/2020   CO2 26 04/14/2020   Lab Results  Component Value Date   CHOL 215 (H) 04/02/2020   HDL 67 04/02/2020   LDLCALC 119 (H) 04/02/2020   TRIG 165 (H) 04/02/2020   CHOLHDL 3.2 04/02/2020   Lab Results  Component Value Date   TSH 3.550 04/02/2020   Lab Results  Component Value Date   HGBA1C 6.1 (A) 10/23/2020   Lab Results  Component Value Date   WBC 5.1 04/14/2020   HGB 12.0 04/14/2020   HCT 36.6 04/14/2020   MCV 89.1 04/14/2020   PLT 219 04/14/2020   Lab  Results  Component Value Date   ALT 15 04/14/2020   AST 19 04/14/2020   ALKPHOS 76 04/14/2020   BILITOT 0.3 04/14/2020     Review of Systems  Constitutional: Negative for appetite change, fatigue, fever and unexpected weight change.  HENT: Negative for tinnitus and trouble swallowing.   Eyes: Negative for visual disturbance.  Respiratory: Negative for cough, chest tightness and shortness of breath.   Cardiovascular: Negative for chest pain, palpitations and leg swelling.  Gastrointestinal: Negative for abdominal pain.  Endocrine: Negative for polydipsia and polyuria.  Genitourinary: Negative for dysuria and hematuria.  Musculoskeletal: Positive for arthralgias, back pain, joint swelling and myalgias.  Neurological: Negative for tremors, numbness and headaches.  Psychiatric/Behavioral: Negative for dysphoric mood.    Patient Active Problem List   Diagnosis Date Noted  . Chronic pain of right knee 10/23/2020  . Osteoporosis 06/04/2020  . Chronic pain syndrome 06/11/2019  . Post herpetic neuralgia 03/20/2019  . Spinal stenosis of lumbar region at multiple levels 03/16/2019  . Anxiety disorder 11/16/2018  . Primary osteoarthritis of both hands 08/16/2018  . Arthralgia of multiple sites 05/18/2018  . Pernicious anemia 12/29/2017  . Type II diabetes mellitus with complication (HCC) 09/30/2015  . Environmental and seasonal allergies 05/30/2015  . Hip pain, chronic 01/27/2015  . Avitaminosis D 11/25/2014  . Psoriasis 11/25/2014  . Hyperlipidemia associated with type 2 diabetes mellitus (HCC) 08/20/2011    Allergies  Allergen Reactions  . Metformin Nausea Only  Past Surgical History:  Procedure Laterality Date  . ABDOMINAL HYSTERECTOMY    . BREAST BIOPSY Left    neg-bx/clip  . BREAST CYST EXCISION Left    neg  . EYE SURGERY Bilateral    cataracts  . PARTIAL HYSTERECTOMY     one ovary remains    Social History   Tobacco Use  . Smoking status: Former Smoker     Packs/day: 0.25    Years: 20.00    Pack years: 5.00    Types: Cigarettes    Quit date: 10/10/1979    Years since quitting: 41.0  . Smokeless tobacco: Never Used  Vaping Use  . Vaping Use: Never used  Substance Use Topics  . Alcohol use: No    Alcohol/week: 0.0 standard drinks  . Drug use: No     Medication list has been reviewed and updated.  Current Meds  Medication Sig  . Acetaminophen (TYLENOL ARTHRITIS EXT RELIEF PO) Take by mouth daily as needed.  . ALPRAZolam (XANAX) 0.25 MG tablet TAKE 1 TABLET (0.25 MG TOTAL) BY MOUTH DAILY AS NEEDED. FOR ANXIETY  . aspirin 325 MG EC tablet Take 325 mg by mouth daily. As needed  . conjugated estrogens (PREMARIN) vaginal cream Apply 0.5mg  (pea-sized amount)  just inside the vaginal introitus with a finger-tip on  Monday, Wednesday and Friday nights.  . COSENTYX SENSOREADY, 300 MG, 150 MG/ML SOAJ   . CRANBERRY PO Take 2 tablets by mouth daily.  Marland Kitchen docusate sodium (COLACE) 100 MG capsule Take 200 mg by mouth 2 (two) times daily as needed for mild constipation.  Marland Kitchen JANUVIA 100 MG tablet TAKE 1 TABLET (100 MG TOTAL) BY MOUTH DAILY.  . metoprolol tartrate (LOPRESSOR) 25 MG tablet Take 0.5 tablets by mouth daily.  . mirabegron ER (MYRBETRIQ) 25 MG TB24 tablet Take 1 tablet (25 mg total) by mouth daily.  . naproxen sodium (ALEVE) 220 MG tablet Take 220 mg by mouth.  . pregabalin (LYRICA) 100 MG capsule TAKE 1 CAPSULE BY MOUTH TWICE A DAY  . Probiotic Product (PROBIOTIC-10 PO) Take by mouth.  . traMADol-acetaminophen (ULTRACET) 37.5-325 MG tablet Take 1 tablet by mouth 2 (two) times daily as needed.    PHQ 2/9 Scores 10/23/2020 04/23/2020 01/17/2020 01/02/2020  PHQ - 2 Score 2 0 0 0  PHQ- 9 Score 2 - 4 1    GAD 7 : Generalized Anxiety Score 10/23/2020 01/17/2020 01/02/2020 05/01/2019  Nervous, Anxious, on Edge 0 0 0 3  Control/stop worrying 0 0 0 3  Worry too much - different things 0 0 1 3  Trouble relaxing 0 0 0 1  Restless 0 0 0 0  Easily annoyed  or irritable 0 0 0 0  Afraid - awful might happen 0 0 0 0  Total GAD 7 Score 0 0 1 10  Anxiety Difficulty Not difficult at all - Not difficult at all Very difficult    BP Readings from Last 3 Encounters:  10/23/20 130/80  04/02/20 140/72  03/18/20 129/70    Physical Exam Vitals and nursing note reviewed.  Constitutional:      General: She is not in acute distress.    Appearance: She is well-developed.  HENT:     Head: Normocephalic and atraumatic.  Cardiovascular:     Rate and Rhythm: Normal rate and regular rhythm.     Pulses: Normal pulses.     Heart sounds: No murmur heard.   Pulmonary:     Effort: Pulmonary effort is normal. No  respiratory distress.     Breath sounds: No wheezing or rhonchi.  Musculoskeletal:     Cervical back: Normal range of motion.     Right knee: No swelling or effusion. Decreased range of motion. Tenderness present over the medial joint line and lateral joint line.     Left knee: No swelling or effusion. Decreased range of motion. Tenderness present over the medial joint line and lateral joint line.     Right lower leg: Normal. No edema.     Left lower leg: Normal. No edema.  Lymphadenopathy:     Cervical: No cervical adenopathy.  Skin:    General: Skin is warm and dry.     Capillary Refill: Capillary refill takes less than 2 seconds.     Findings: No rash.  Neurological:     Mental Status: She is alert and oriented to person, place, and time.  Psychiatric:        Mood and Affect: Mood normal.        Behavior: Behavior normal.     Wt Readings from Last 3 Encounters:  10/23/20 163 lb (73.9 kg)  04/02/20 164 lb (74.4 kg)  03/18/20 160 lb (72.6 kg)    BP 130/80   Pulse 70   Ht 5\' 6"  (1.676 m)   Wt 163 lb (73.9 kg)   SpO2 94%   BMI 26.31 kg/m   Assessment and Plan: 1. Type II diabetes mellitus with complication (HCC) BS well controlled on singe agent Continue balanced diet; may be able to discontinue medication if A1C remains  below 6.5 - POCT UA - Microalbumin - POCT glycosylated hemoglobin (Hb A1C)  2. Hyperlipidemia associated with type 2 diabetes mellitus (HCC) Not currently on medication due to age and previous low LDL Would consider low dose intermittent dosing if still elevated   3. Post herpetic neuralgia Ongoing symptoms partially relieved by Lyrica  4. Chronic pain of right knee Likely due to psoriatic arthritis Continue Tylenol as needed Will get plain films to further evaluate - consider SM evaluation - DG Knee Complete 4 Views Right; Future   Partially dictated using Dragon software. Any errors are unintentional.  , MD [facility]  10/23/2020

## 2020-10-28 DIAGNOSIS — C44319 Basal cell carcinoma of skin of other parts of face: Secondary | ICD-10-CM | POA: Diagnosis not present

## 2020-11-10 DIAGNOSIS — M47816 Spondylosis without myelopathy or radiculopathy, lumbar region: Secondary | ICD-10-CM | POA: Diagnosis not present

## 2020-11-10 DIAGNOSIS — M48061 Spinal stenosis, lumbar region without neurogenic claudication: Secondary | ICD-10-CM | POA: Diagnosis not present

## 2020-11-10 DIAGNOSIS — B0229 Other postherpetic nervous system involvement: Secondary | ICD-10-CM | POA: Diagnosis not present

## 2020-11-11 DIAGNOSIS — C44319 Basal cell carcinoma of skin of other parts of face: Secondary | ICD-10-CM | POA: Diagnosis not present

## 2020-11-17 DIAGNOSIS — M47816 Spondylosis without myelopathy or radiculopathy, lumbar region: Secondary | ICD-10-CM | POA: Diagnosis not present

## 2020-11-27 DIAGNOSIS — C44319 Basal cell carcinoma of skin of other parts of face: Secondary | ICD-10-CM | POA: Diagnosis not present

## 2020-12-10 ENCOUNTER — Other Ambulatory Visit: Payer: Self-pay | Admitting: Internal Medicine

## 2020-12-10 DIAGNOSIS — B029 Zoster without complications: Secondary | ICD-10-CM

## 2020-12-10 MED ORDER — PREGABALIN 100 MG PO CAPS: 100.0000 mg | ORAL_CAPSULE | Freq: Two times a day (BID) | ORAL | 2 refills | Status: DC

## 2020-12-10 NOTE — Telephone Encounter (Signed)
Requested medication (s) are due for refill today:  yes  Requested medication (s) are on the active medication list: yes  Last refill:  11/08/2020  Future visit scheduled: yes  Notes to clinic: this refill cannot be delegated   Requested Prescriptions  Pending Prescriptions Disp Refills   pregabalin (LYRICA) 100 MG capsule [Pharmacy Med Name: PREGABALIN 100 MG CAPSULE] 60 capsule     Sig: TAKE 1 CAPSULE BY MOUTH TWICE A DAY      Not Delegated - Neurology:  Anticonvulsants - Controlled Failed - 12/10/2020  9:43 AM      Failed - This refill cannot be delegated      Passed - Valid encounter within last 12 months    Recent Outpatient Visits           1 month ago Post herpetic neuralgia   [facility] Reubin Milan, MD   8 months ago Annual physical exam   [facility] Reubin Milan, MD   10 months ago Acute cystitis without hematuria   [facility] Reubin Milan, MD   11 months ago Acute cystitis without hematuria   [facility] Reubin Milan, MD   1 year ago Type II diabetes mellitus with complication [facility])   [facility] Reubin Milan, MD       Future Appointments             In 4 months Judithann Graves Nyoka Cowden, MD [facility], [facility]

## 2021-01-19 ENCOUNTER — Other Ambulatory Visit: Payer: Self-pay

## 2021-01-19 ENCOUNTER — Encounter: Payer: Self-pay | Admitting: Urology

## 2021-01-19 ENCOUNTER — Ambulatory Visit: Payer: Medicare PPO | Admitting: Urology

## 2021-01-19 ENCOUNTER — Other Ambulatory Visit
Admission: RE | Admit: 2021-01-19 | Discharge: 2021-01-19 | Disposition: A | Discharge status: Home / Self Care | Payer: Medicare PPO | Attending: Urology | Admitting: Urology

## 2021-01-19 ENCOUNTER — Other Ambulatory Visit: Payer: Self-pay | Admitting: *Deleted

## 2021-01-19 VITALS — BP 159/74 | HR 79 | Ht 66.0 in | Wt 167.0 lb

## 2021-01-19 DIAGNOSIS — N952 Postmenopausal atrophic vaginitis: Secondary | ICD-10-CM | POA: Diagnosis not present

## 2021-01-19 DIAGNOSIS — N3946 Mixed incontinence: Secondary | ICD-10-CM | POA: Diagnosis not present

## 2021-01-19 DIAGNOSIS — R102 Pelvic and perineal pain: Secondary | ICD-10-CM | POA: Diagnosis not present

## 2021-01-19 DIAGNOSIS — N390 Urinary tract infection, site not specified: Secondary | ICD-10-CM

## 2021-01-19 LAB — URINALYSIS, COMPLETE (UACMP) WITH MICROSCOPIC
Bilirubin Urine: NEGATIVE
Glucose, UA: NEGATIVE mg/dL
Ketones, ur: NEGATIVE mg/dL
Nitrite: NEGATIVE
Specific Gravity, Urine: 1.01 (ref 1.005–1.030)
WBC, UA: >50 WBC/hpf (ref 0–5)
pH: 6 (ref 5.0–8.0)

## 2021-01-19 LAB — BLADDER SCAN AMB NON-IMAGING

## 2021-01-19 MED ORDER — NITROFURANTOIN MONOHYD MACRO 100 MG PO CAPS: 100.0000 mg | ORAL_CAPSULE | Freq: Two times a day (BID) | ORAL | 0 refills | Status: DC

## 2021-01-19 MED ORDER — PREMARIN 0.625 MG/GM VA CREA: TOPICAL_CREAM | VAGINAL | 12 refills | Status: DC

## 2021-01-19 NOTE — Progress Notes (Signed)
01/19/2021 2:52 PM   Jordan Montoya 1933/12/18 [id number]  Referring provider: Reubin Milan, MD [address] [place],  [place] [address]  Urological history: 1. rUTI's -contributing factors of age, incontinence, DM and constipation -documented positive urine cultures over the last year  E.coli resistant to ampicillin, ampicllin/sulbactam, cefazolin and trimeth/sulfate 01/25/2020  2. Incontinence -contributing factors of age, DM, anxiety, depression and arthritis  -managed with Myrbetriq 25 mg daily  3. Vaginal atrophy -managed with vaginal estrogen cream three nights weekly   Chief Complaint  Patient presents with   Urinary Tract Infection    HPI: Jordan Montoya is a 87 y.o. female who presents today for pressure in her bottom.  Her symptoms started one week ago with pressure in her bottom, increase in nocturia, urge incontinence and frequency.  Patient denies any modifying or aggravating factors.  Patient denies any gross hematuria, dysuria or suprapubic/flank pain.  Patient denies any fevers, chills, nausea or vomiting.    UA 6-10 squames, > 50 WBC's, 11-20 RBC's, few bacteria, present WBC clumps and amorphous crystal present.    PVR 79 mL   She is not drinking a lot of water, she is taking cranberry tablets and she is using the vaginal estrogen cream sparingly.    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   Psoriasis 11/25/2014   followed by Dermatology    Shingles 11/2018   Type 2 diabetes mellitus with hyperosmolarity without coma, without long-term current use of insulin (HCC) 09/30/2015   Unspecified septicemia(038.9) (HCC) 02/28/2019    Surgical History: Past Surgical  History:  Procedure Laterality Date   ABDOMINAL HYSTERECTOMY     BREAST BIOPSY Left    neg-bx/clip   BREAST CYST EXCISION Left    neg   EYE SURGERY Bilateral    cataracts   PARTIAL HYSTERECTOMY     one ovary remains    Home Medications:  Allergies as of 01/19/2021       Reactions   Metformin Nausea Only        Medication List        Accurate as of January 19, 2021  2:52 PM. If you have any questions, ask your nurse or doctor.          ALPRAZolam 0.25 MG tablet Commonly known as: XANAX TAKE 1 TABLET (0.25 MG TOTAL) BY MOUTH DAILY AS NEEDED. FOR ANXIETY   aspirin 325 MG EC tablet Take 325 mg by mouth daily. As needed   Cosentyx Sensoready (300 MG) 150 MG/ML Soaj Generic drug: Secukinumab (300 MG Dose)   CRANBERRY PO Take 2 tablets by mouth daily.   docusate sodium 100 MG capsule Commonly known as: COLACE Take 200 mg by mouth 2 (two) times daily as needed for mild constipation.   Januvia 100 MG tablet Generic drug: sitaGLIPtin TAKE 1 TABLET (100 MG TOTAL) BY MOUTH DAILY.   metoprolol tartrate 25 MG tablet Commonly known as: LOPRESSOR Take 0.5 tablets by mouth daily.   mirabegron ER 25 MG Tb24 tablet Commonly known as: MYRBETRIQ Take 1 tablet (25 mg total) by mouth daily.   naproxen sodium 220 MG tablet Commonly known as: ALEVE Take 220 mg by mouth.   nitrofurantoin (  macrocrystal-monohydrate) 100 MG capsule Commonly known as: MACROBID Take 1 capsule (100 mg total) by mouth every 12 (twelve) hours. Started by: Michiel Cowboy, PA-C   pregabalin 100 MG capsule Commonly known as: LYRICA Take 1 capsule (100 mg total) by mouth 2 (two) times daily.   Premarin vaginal cream Generic drug: conjugated estrogens Apply 0.5mg  (pea-sized amount)  just inside the vaginal introitus with a finger-tip on  Monday, Wednesday and Friday nights.   PROBIOTIC-10 PO Take by mouth.   traMADol-acetaminophen 37.5-325 MG tablet Commonly known as: ULTRACET Take 1 tablet  by mouth 2 (two) times daily as needed.   TYLENOL ARTHRITIS EXT RELIEF PO Take by mouth daily as needed.        Allergies:  Allergies  Allergen Reactions   Metformin Nausea Only    Family History: Family History  Problem Relation Age of Onset   Cancer Mother    Heart disease Father    Diabetes Son    Breast cancer Neg Hx     Social History:  reports that she quit smoking about 41 years ago. Her smoking use included cigarettes. She has a 5.00 pack-year smoking history. She has never used smokeless tobacco. She reports that she does not drink alcohol and does not use drugs.  ROS: Pertinent ROS in HPI  Physical Exam: BP (!) 159/74   Pulse 79   Ht 5\' 6"  (1.676 m)   Wt 167 lb (75.8 kg)   BMI 26.95 kg/m   Constitutional:  Well nourished. Alert and oriented, No acute distress. HEENT: [place] AT, mask in place  Trachea midline Cardiovascular: No clubbing, cyanosis, or edema. Respiratory: Normal respiratory effort, no increased work of breathing. Neurologic: Grossly intact, no focal deficits, moving all 4 extremities. Psychiatric: Normal mood and affect.    Laboratory Data: Lab Results  Component Value Date   WBC 5.1 04/14/2020   HGB 12.0 04/14/2020   HCT 36.6 04/14/2020   MCV 89.1 04/14/2020   PLT 219 04/14/2020    Lab Results  Component Value Date   CREATININE 0.90 04/14/2020    Lab Results  Component Value Date   HGBA1C 6.1 (A) 10/23/2020    Lab Results  Component Value Date   TSH 3.550 04/02/2020       Component Value Date/Time   CHOL 215 (H) 04/02/2020 1036   HDL 67 04/02/2020 1036   CHOLHDL 3.2 04/02/2020 1036   LDLCALC 119 (H) 04/02/2020 1036    Lab Results  Component Value Date   AST 19 04/14/2020   Lab Results  Component Value Date   ALT 15 04/14/2020     Urinalysis Component     Latest Ref Rng & Units 01/19/2021  Color, Urine     YELLOW YELLOW  Appearance     CLEAR HAZY (A)  Specific Gravity, Urine     1.005 - 1.030 1.010  pH      5.0 - 8.0 6.0  Glucose, UA     NEGATIVE mg/dL NEGATIVE  Hgb urine dipstick     NEGATIVE MODERATE (A)  Bilirubin Urine     NEGATIVE NEGATIVE  Ketones, ur     NEGATIVE mg/dL NEGATIVE  Protein     NEGATIVE mg/dL TRACE (A)  Nitrite     NEGATIVE NEGATIVE  Leukocytes,Ua     NEGATIVE MODERATE (A)  Squamous Epithelial / LPF     0 - 5 6-10  WBC, UA     0 - 5 WBC/hpf >50  RBC / HPF  0 - 5 RBC/hpf 11-20  Bacteria, UA     NONE SEEN FEW (A)  WBC Clumps      PRESENT  Amorphous Crystal      PRESENT  I have reviewed the labs.   Pertinent Imaging: Results for ISHA, SEEFELD (MRN [id number]) as of 01/19/2021 14:39  Ref. Range 01/19/2021 14:36  Scan Result Unknown 6ml    Assessment & Plan:    1. Pelvic pressure -UA grossly infected -Urine sent for culture -Macrobid 100 mg, twice daily for seven days   2. rUTI's -discontinued Macrobid prophylaxis in 08/2020  - patient is instructed to increase their water intake until the urine is pale yellow or clear (10 to 12 cups daily)  - patient is instructed to take probiotics (yogurt, oral pills or vaginal suppositories), take cranberry pills or drink the juice and Vitamin C 1,000 mg daily to acidify the urine   3. Incontinence -continue Myrbetriq 25 mg daily    4. Vaginal atrophy -I explained to the patient that when women go through menopause and her estrogen levels are severely diminished, the normal vaginal flora will change.  This is due to an increase of the vaginal canal's pH. Because of this, the vaginal canal may be colonized by bacteria from the rectum instead of the protective lactobacillus.  This, accompanied by the loss of the mucus barrier with vaginal atrophy, is a cause of recurrent urinary tract infections. -encouraged her to use it three nights weekly -refill sent to pharmacy                                        Return in about 1 month (around 02/18/2021) for UA and symptoms recheck .  These notes generated  with voice recognition software. I apologize for typographical errors.  Michiel Cowboy, PA-C  [facility] [address]  [address] [place], [place] [address] [phone]

## 2021-01-21 LAB — URINE CULTURE: Culture: 50000 — AB

## 2021-01-22 ENCOUNTER — Telehealth: Payer: Self-pay

## 2021-01-22 DIAGNOSIS — N390 Urinary tract infection, site not specified: Secondary | ICD-10-CM

## 2021-01-22 NOTE — Addendum Note (Signed)
Addended by: Martha Clan on: 01/22/2021 03:28 PM   Modules accepted: Orders

## 2021-01-22 NOTE — Telephone Encounter (Signed)
Patient notified, she is not able to come to the [place] location for an appointment. She will come to [facility] on Monday for Cath UA. She states she is doing slightly better on ABX and will complete the current course

## 2021-01-22 NOTE — Telephone Encounter (Signed)
-----   Message from Harle Battiest, PA-C sent at 01/21/2021 12:23 PM EDT ----- Please let Jordan Montoya know that her urine culture returned contaminated and we need to get her in for a cath UA for urinalysis and urine culture.

## 2021-01-25 NOTE — Progress Notes (Signed)
01/26/2021 3:06 PM   Jordan Montoya [birth year] [id number]  Referring provider: Reubin Milan, MD [address] [place],  [place] [address]  Urological history: 1. rUTI's -contributing factors of age, incontinence, DM and constipation -documented positive urine cultures over the last year  E.coli resistant to ampicillin, ampicllin/sulbactam, cefazolin and trimeth/sulfate 01/25/2020  Multiple species on 01/19/2021  2. Incontinence -contributing factors of age, DM, anxiety, depression and arthritis  -managed with Myrbetriq 25 mg daily  3. Vaginal atrophy -managed with vaginal estrogen cream three nights weekly   Chief Complaint  Patient presents with   Follow-up    Cath UA     HPI: Jordan Montoya is a 87 y.o. female who presents today for CATH UA.  Patient presented on 01/19/2021 for symptoms of one week ago with pressure in her bottom, increase in nocturia, urge incontinence and frequency.  Her UA was positive for 6-10 squames, > 50 WBC's, 11-20 RBC's, few bacteria, present WBC clumps and amorphous crystal present.   She was placed on Macrobid 100 mg, BID x 7 days.  Urine culture grew out multiple species.  She was also encouraged to apply the vaginal estrogen cream three nights weekly.    She states yesterday she was weak and dizzy and could not get out of bed.  She has also been short of breath.  She states she has been having these episodes off and on for several months.  She states she hasn't seen her cardiologist in over two years.    Reviewing her cardiologist notes, she has been seen and evaluated for SOBOE two years ago.  It was recommended at that time not to have any further cardiac intervention for her symptoms of dyspnea as her stress test was normal at that time.  This was in 2019.  She was last seen in 2020.  She states it is difficult to get to doctor's appointment due transportation issues.    She has two sons, but she states they  work and she doesn't want to bother them.    She has also developed a rash on her hands after starting the Macrobid.  She will finish the prescription today.    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   Psoriasis 11/25/2014   followed by Dermatology    Shingles 11/2018   Type 2 diabetes mellitus with hyperosmolarity without coma, without long-term current use of insulin (HCC) 09/30/2015   Unspecified septicemia(038.9) (HCC) 02/28/2019    Surgical History: Past Surgical History:  Procedure Laterality Date   ABDOMINAL HYSTERECTOMY     BREAST BIOPSY Left    neg-bx/clip   BREAST CYST EXCISION Left    neg   EYE SURGERY Bilateral    cataracts   PARTIAL HYSTERECTOMY     one ovary remains    Home Medications:  Allergies as of 01/26/2021       Reactions   Metformin Nausea Only   Macrobid [nitrofurantoin] Rash        Medication List        Accurate as of January 26, 2021  3:06 PM. If you have any questions, ask your nurse or doctor.  ALPRAZolam 0.25 MG tablet Commonly known as: XANAX TAKE 1 TABLET (0.25 MG TOTAL) BY MOUTH DAILY AS NEEDED. FOR ANXIETY   aspirin 325 MG EC tablet Take 325 mg by mouth daily. As needed   Cosentyx Sensoready (300 MG) 150 MG/ML Soaj Generic drug: Secukinumab (300 MG Dose)   CRANBERRY PO Take 2 tablets by mouth daily.   docusate sodium 100 MG capsule Commonly known as: COLACE Take 200 mg by mouth 2 (two) times daily as needed for mild constipation.   Januvia 100 MG tablet Generic drug: sitaGLIPtin TAKE 1 TABLET (100 MG TOTAL) BY MOUTH DAILY.   metoprolol tartrate 25 MG tablet Commonly known as: LOPRESSOR Take 0.5 tablets by mouth daily.   mirabegron ER 25 MG Tb24  tablet Commonly known as: MYRBETRIQ Take 1 tablet (25 mg total) by mouth daily.   naproxen sodium 220 MG tablet Commonly known as: ALEVE Take 220 mg by mouth.   nitrofurantoin (macrocrystal-monohydrate) 100 MG capsule Commonly known as: MACROBID Take 1 capsule (100 mg total) by mouth every 12 (twelve) hours.   pregabalin 100 MG capsule Commonly known as: LYRICA Take 1 capsule (100 mg total) by mouth 2 (two) times daily.   Premarin vaginal cream Generic drug: conjugated estrogens Apply 0.5mg  (pea-sized amount)  just inside the vaginal introitus with a finger-tip on  Monday, Wednesday and Friday nights.   PROBIOTIC-10 PO Take by mouth.   traMADol-acetaminophen 37.5-325 MG tablet Commonly known as: ULTRACET Take 1 tablet by mouth 2 (two) times daily as needed.   TYLENOL ARTHRITIS EXT RELIEF PO Take by mouth daily as needed.        Allergies:  Allergies  Allergen Reactions   Metformin Nausea Only   Macrobid [Nitrofurantoin] Rash    Family History: Family History  Problem Relation Age of Onset   Cancer Mother    Heart disease Father    Diabetes Son    Breast cancer Neg Hx     Social History:  reports that she quit smoking about 41 years ago. Her smoking use included cigarettes. She has a 5.00 pack-year smoking history. She has never used smokeless tobacco. She reports that she does not drink alcohol and does not use drugs.  ROS: Pertinent ROS in HPI  Physical Exam: BP (!) 164/72   Pulse 80   Ht 5\' 6"  (1.676 m)   Wt 164 lb (74.4 kg)   BMI 26.47 kg/m   Constitutional:  Well nourished. Alert and oriented, No acute distress. HEENT: [place] AT, mask in place  Trachea midline Cardiovascular: No clubbing, cyanosis, or edema. Respiratory: Normal respiratory effort, no increased work of breathing. Neurologic: Grossly intact, no focal deficits, moving all 4 extremities. Psychiatric: Normal mood and affect.     Laboratory Data: Urinalysis Component     Latest Ref  Rng & Units 01/26/2021  Color, Urine     YELLOW YELLOW  Appearance     CLEAR CLEAR  Specific Gravity, Urine     1.005 - 1.030 1.015  pH     5.0 - 8.0 5.5  Glucose, UA     NEGATIVE mg/dL NEGATIVE  Hgb urine dipstick     NEGATIVE NEGATIVE  Bilirubin Urine     NEGATIVE NEGATIVE  Ketones, ur     NEGATIVE mg/dL NEGATIVE  Protein     NEGATIVE mg/dL NEGATIVE  Nitrite     NEGATIVE NEGATIVE  Leukocytes,Ua     NEGATIVE NEGATIVE  RBC / HPF     0 - 5 RBC/hpf 0-5  WBC, UA     0 - 5 WBC/hpf 0-5  Bacteria, UA     NONE SEEN NONE SEEN  Squamous Epithelial / LPF     0 - 5 0-5  I have reviewed the labs.    Assessment & Plan:    1. SOB -Explained to Mrs. Reede that we cannot address her shortness of breath as we are urology office and I encouraged her to at least seek treatment downstairs at the urgent care center as she is here.  I stated that her shortness of breath may be due to some cardiac issues and it is important that she follows up quickly to avoid any serious cardiac complications -She stated that she did not feel well and she stated she was just going  home -I asked if I could contact one of her sons to see if they could arrange transportation for her to get her seen in a timely manner and she asked that I do not call them   2. Pelvic pressure -UA grossly infected -Urine sent for culture -Macrobid 100 mg, twice daily for seven days   2. rUTI's -discontinued Macrobid prophylaxis in 08/2020  - patient is instructed to increase their water intake until the urine is pale yellow or clear (10 to 12 cups daily)  - patient is instructed to take probiotics (yogurt, oral pills or vaginal suppositories), take cranberry pills or drink the juice and Vitamin C 1,000 mg daily to acidify the urine   3. Incontinence -continue Myrbetriq 25 mg daily    4. Vaginal atrophy -I explained to the patient that when women go through menopause and her estrogen levels are severely diminished, the  normal vaginal flora will change.  This is due to an increase of the vaginal canal's pH. Because of this, the vaginal canal may be colonized by bacteria from the rectum instead of the protective lactobacillus.  This, accompanied by the loss of the mucus barrier with vaginal atrophy, is a cause of recurrent urinary tract infections. -encouraged her to use it three nights weekly -refill sent to pharmacy                                        Return for pending urine cutlure results .  These notes generated with voice recognition software. I apologize for typographical errors.  Michiel Cowboy, PA-C  [facility] [address]  [address] [place], [place] [address] [phone]

## 2021-01-26 ENCOUNTER — Encounter: Payer: Self-pay | Admitting: Urology

## 2021-01-26 ENCOUNTER — Ambulatory Visit: Payer: Medicare PPO | Admitting: Urology

## 2021-01-26 ENCOUNTER — Other Ambulatory Visit
Admission: RE | Admit: 2021-01-26 | Discharge: 2021-01-26 | Disposition: A | Discharge status: Home / Self Care | Payer: Medicare PPO | Source: Ambulatory Visit | Attending: Urology | Admitting: Urology

## 2021-01-26 ENCOUNTER — Other Ambulatory Visit: Payer: Self-pay

## 2021-01-26 VITALS — BP 164/72 | HR 80 | Ht 66.0 in | Wt 164.0 lb

## 2021-01-26 DIAGNOSIS — R102 Pelvic and perineal pain: Secondary | ICD-10-CM

## 2021-01-26 DIAGNOSIS — N390 Urinary tract infection, site not specified: Secondary | ICD-10-CM

## 2021-01-26 DIAGNOSIS — N3946 Mixed incontinence: Secondary | ICD-10-CM

## 2021-01-26 DIAGNOSIS — N952 Postmenopausal atrophic vaginitis: Secondary | ICD-10-CM | POA: Diagnosis not present

## 2021-01-26 LAB — URINALYSIS, COMPLETE (UACMP) WITH MICROSCOPIC
Bacteria, UA: NONE SEEN
Bilirubin Urine: NEGATIVE
Glucose, UA: NEGATIVE mg/dL
Hgb urine dipstick: NEGATIVE
Ketones, ur: NEGATIVE mg/dL
Leukocytes,Ua: NEGATIVE
Nitrite: NEGATIVE
Protein, ur: NEGATIVE mg/dL
Specific Gravity, Urine: 1.015 (ref 1.005–1.030)
pH: 5.5 (ref 5.0–8.0)

## 2021-01-27 ENCOUNTER — Ambulatory Visit
Admission: RE | Admit: 2021-01-27 | Discharge: 2021-01-27 | Disposition: A | Discharge status: Home / Self Care | Payer: Medicare PPO | Attending: Internal Medicine | Admitting: Internal Medicine

## 2021-01-27 ENCOUNTER — Ambulatory Visit
Admission: RE | Admit: 2021-01-27 | Discharge: 2021-01-27 | Disposition: A | Discharge status: Home / Self Care | Payer: Medicare PPO | Source: Ambulatory Visit | Attending: Internal Medicine | Admitting: Internal Medicine

## 2021-01-27 ENCOUNTER — Encounter: Payer: Self-pay | Admitting: Internal Medicine

## 2021-01-27 ENCOUNTER — Ambulatory Visit: Payer: Medicare PPO | Admitting: Internal Medicine

## 2021-01-27 VITALS — BP 160/78 | HR 67 | Temp 97.7°F | Ht 66.0 in | Wt 166.0 lb

## 2021-01-27 DIAGNOSIS — R0600 Dyspnea, unspecified: Secondary | ICD-10-CM | POA: Diagnosis not present

## 2021-01-27 DIAGNOSIS — E118 Type 2 diabetes mellitus with unspecified complications: Secondary | ICD-10-CM

## 2021-01-27 DIAGNOSIS — I471 Supraventricular tachycardia, unspecified: Secondary | ICD-10-CM

## 2021-01-27 DIAGNOSIS — R0609 Other forms of dyspnea: Secondary | ICD-10-CM

## 2021-01-27 DIAGNOSIS — J439 Emphysema, unspecified: Secondary | ICD-10-CM | POA: Diagnosis not present

## 2021-01-27 HISTORY — DX: Supraventricular tachycardia, unspecified: I47.10

## 2021-01-27 NOTE — Patient Instructions (Signed)
Olena Heckle, MD   [address]   [facility]   [place], [place] [address]   [phone]   [phone] (Fax)

## 2021-01-27 NOTE — Progress Notes (Signed)
Date:  01/27/2021   Name:  Jordan Montoya   DOB:  09/29/1933   MRN:  [id number]   Chief Complaint: Shortness of Breath (Ongoing problem for a while, when moving around she breathes heavy)  Shortness of Breath This is a chronic problem. The problem occurs daily. The problem has been unchanged. Pertinent negatives include no chest pain, fever, headaches, leg swelling, orthopnea, sputum production or wheezing. The symptoms are aggravated by any activity. She has tried nothing for the symptoms. Her past medical history is significant for CAD (SVT controlled with metoprolol). There is no history of asthma, COPD or pneumonia.  Diabetes She presents for her follow-up diabetic visit. She has type 2 diabetes mellitus. Hypoglycemia symptoms include nervousness/anxiousness. Pertinent negatives for hypoglycemia include no dizziness or headaches. Pertinent negatives for diabetes include no chest pain and no fatigue. Current diabetic treatment includes oral agent (monotherapy) Alma Friendly). Her weight is stable. She is following a generally healthy diet. Her breakfast blood glucose is taken between 6-7 am. Her breakfast blood glucose range is generally 110-130 mg/dl. Her dinner blood glucose is taken between 5-6 pm. Her dinner blood glucose range is generally 130-140 mg/dl. An ACE inhibitor/angiotensin II receptor blocker is not being taken.   Lab Results  Component Value Date   CREATININE 0.90 04/14/2020   BUN 27 (H) 04/14/2020   NA 140 04/14/2020   K 4.6 04/14/2020   CL 106 04/14/2020   CO2 26 04/14/2020   Lab Results  Component Value Date   CHOL 215 (H) 04/02/2020   HDL 67 04/02/2020   LDLCALC 119 (H) 04/02/2020   TRIG 165 (H) 04/02/2020   CHOLHDL 3.2 04/02/2020   Lab Results  Component Value Date   TSH 3.550 04/02/2020   Lab Results  Component Value Date   HGBA1C 6.1 (A) 10/23/2020   Lab Results  Component Value Date   WBC 5.1 04/14/2020   HGB 12.0 04/14/2020   HCT 36.6  04/14/2020   MCV 89.1 04/14/2020   PLT 219 04/14/2020   Lab Results  Component Value Date   ALT 15 04/14/2020   AST 19 04/14/2020   ALKPHOS 76 04/14/2020   BILITOT 0.3 04/14/2020     Review of Systems  Constitutional:  Negative for chills, diaphoresis, fatigue, fever and unexpected weight change.  Respiratory:  Positive for shortness of breath. Negative for cough, sputum production, choking, chest tightness and wheezing.   Cardiovascular:  Positive for palpitations. Negative for chest pain, orthopnea and leg swelling.  Gastrointestinal:  Negative for constipation and diarrhea.  Genitourinary:  Positive for dysuria.  Musculoskeletal:  Positive for myalgias (chronic pain from PHN).  Neurological:  Negative for dizziness, light-headedness and headaches.  Psychiatric/Behavioral:  Negative for sleep disturbance. The patient is nervous/anxious.    Patient Active Problem List   Diagnosis Date Noted   Chronic pain of right knee 10/23/2020   Osteoporosis 06/04/2020   Chronic pain syndrome 06/11/2019   Post herpetic neuralgia 03/20/2019   Spinal stenosis of lumbar region at multiple levels 03/16/2019   Anxiety disorder 11/16/2018   Primary osteoarthritis of both hands 08/16/2018   Arthralgia of multiple sites 05/18/2018   Pernicious anemia 12/29/2017   Type II diabetes mellitus with complication (HCC) 09/30/2015   Environmental and seasonal allergies 05/30/2015   Hip pain, chronic 01/27/2015   Avitaminosis D 11/25/2014   Psoriasis 11/25/2014   Hyperlipidemia associated with type 2 diabetes mellitus (HCC) 08/20/2011    Allergies  Allergen Reactions   Metformin Nausea Only  Macrobid [Nitrofurantoin] Rash    Past Surgical History:  Procedure Laterality Date   ABDOMINAL HYSTERECTOMY     BREAST BIOPSY Left    neg-bx/clip   BREAST CYST EXCISION Left    neg   EYE SURGERY Bilateral    cataracts   PARTIAL HYSTERECTOMY     one ovary remains    Social History   Tobacco Use    Smoking status: Former    Packs/day: 0.25    Years: 20.00    Pack years: 5.00    Types: Cigarettes    Quit date: 10/10/1979    Years since quitting: 41.3   Smokeless tobacco: Never  Vaping Use   Vaping Use: Never used  Substance Use Topics   Alcohol use: No    Alcohol/week: 0.0 standard drinks   Drug use: No     Medication list has been reviewed and updated.  Current Meds  Medication Sig   Acetaminophen (TYLENOL ARTHRITIS EXT RELIEF PO) Take by mouth daily as needed.   ALPRAZolam (XANAX) 0.25 MG tablet TAKE 1 TABLET (0.25 MG TOTAL) BY MOUTH DAILY AS NEEDED. FOR ANXIETY   aspirin 325 MG EC tablet Take 325 mg by mouth daily. As needed   conjugated estrogens (PREMARIN) vaginal cream Apply 0.5mg  (pea-sized amount)  just inside the vaginal introitus with a finger-tip on  Monday, Wednesday and Friday nights.   COSENTYX SENSOREADY, 300 MG, 150 MG/ML SOAJ    CRANBERRY PO Take 2 tablets by mouth daily.   docusate sodium (COLACE) 100 MG capsule Take 200 mg by mouth 2 (two) times daily as needed for mild constipation.   JANUVIA 100 MG tablet TAKE 1 TABLET (100 MG TOTAL) BY MOUTH DAILY.   metoprolol tartrate (LOPRESSOR) 25 MG tablet Take 0.5 tablets by mouth daily.   mirabegron ER (MYRBETRIQ) 25 MG TB24 tablet Take 1 tablet (25 mg total) by mouth daily.   naproxen sodium (ALEVE) 220 MG tablet Take 220 mg by mouth.   pregabalin (LYRICA) 100 MG capsule Take 1 capsule (100 mg total) by mouth 2 (two) times daily.   Probiotic Product (PROBIOTIC-10 PO) Take by mouth.    PHQ 2/9 Scores 01/27/2021 10/23/2020 04/23/2020 01/17/2020  PHQ - 2 Score 0 2 0 0  PHQ- 9 Score 1 2 - 4    GAD 7 : Generalized Anxiety Score 01/27/2021 10/23/2020 01/17/2020 01/02/2020  Nervous, Anxious, on Edge 0 0 0 0  Control/stop worrying 1 0 0 0  Worry too much - different things 1 0 0 1  Trouble relaxing 0 0 0 0  Restless 0 0 0 0  Easily annoyed or irritable 0 0 0 0  Afraid - awful might happen 0 0 0 0  Total GAD 7 Score  2 0 0 1  Anxiety Difficulty - Not difficult at all - Not difficult at all    BP Readings from Last 3 Encounters:  01/27/21 (!) 160/78  01/26/21 (!) 164/72  01/19/21 (!) 159/74    Physical Exam Vitals and nursing note reviewed.  Constitutional:      General: She is not in acute distress.    Appearance: She is well-developed.  HENT:     Head: Normocephalic and atraumatic.  Pulmonary:     Effort: Pulmonary effort is normal. No respiratory distress.     Breath sounds: No decreased breath sounds, wheezing or rhonchi.     Comments: Slight increase in respiratory effort noted Musculoskeletal:     Right lower leg: No edema.  Left lower leg: No edema.  Skin:    General: Skin is warm and dry.     Findings: No rash.  Neurological:     General: No focal deficit present.     Mental Status: She is alert and oriented to person, place, and time.  Psychiatric:        Mood and Affect: Mood normal.        Behavior: Behavior normal.    Wt Readings from Last 3 Encounters:  01/27/21 166 lb (75.3 kg)  01/26/21 164 lb (74.4 kg)  01/19/21 167 lb (75.8 kg)    BP (!) 160/78   Pulse 67   Temp 97.7 F (36.5 C) (Oral)   Ht 5\' 6"  (1.676 m)   Wt 166 lb (75.3 kg)   SpO2 95%   BMI 26.79 kg/m   Assessment and Plan: 1. PSVT (paroxysmal supraventricular tachycardia) (HCC) On metoprolol for control No recent episodes per patient Metoprolol could be contributing to SOB - will recommend Cardiology evaluation - DG Chest 2 View; Future  2. Type II diabetes mellitus with complication (HCC) On single agent with last A1C 6.1. Home glucoses are 110 - 140; occasionally higher - Comprehensive metabolic panel - Hemoglobin A1c  3. DOE (dyspnea on exertion) Uncertain cause - will get CXR and labs Recommend Cardiology evaluation - pt will call Dr. for appt - CBC with Differential/Platelet - TSH + free T4   Partially dictated using Dragon software. Any errors are unintentional.  Gwen Pounds, MD [facility]  01/27/2021

## 2021-01-28 LAB — CBC WITH DIFFERENTIAL/PLATELET
Basophils Absolute: 0 10*3/uL (ref 0.0–0.2)
Basos: 1 %
EOS (ABSOLUTE): 0.1 10*3/uL (ref 0.0–0.4)
Eos: 2 %
Hematocrit: 37.9 % (ref 34.0–46.6)
Hemoglobin: 12.4 g/dL (ref 11.1–15.9)
Immature Grans (Abs): 0 10*3/uL (ref 0.0–0.1)
Immature Granulocytes: 1 %
Lymphocytes Absolute: 2.5 10*3/uL (ref 0.7–3.1)
Lymphs: 39 %
MCH: 29 pg (ref 26.6–33.0)
MCHC: 32.7 g/dL (ref 31.5–35.7)
MCV: 89 fL (ref 79–97)
Monocytes Absolute: 0.5 10*3/uL (ref 0.1–0.9)
Monocytes: 8 %
Neutrophils Absolute: 3.3 10*3/uL (ref 1.4–7.0)
Neutrophils: 49 %
Platelets: 262 10*3/uL (ref 150–450)
RBC: 4.28 x10E6/uL (ref 3.77–5.28)
RDW: 13.6 % (ref 11.7–15.4)
WBC: 6.5 10*3/uL (ref 3.4–10.8)

## 2021-01-28 LAB — COMPREHENSIVE METABOLIC PANEL
ALT: 13 IU/L (ref 0–32)
AST: 18 IU/L (ref 0–40)
Albumin/Globulin Ratio: 1.8 (ref 1.2–2.2)
Albumin: 4.5 g/dL (ref 3.6–4.6)
Alkaline Phosphatase: 78 IU/L (ref 44–121)
BUN/Creatinine Ratio: 21 (ref 12–28)
BUN: 16 mg/dL (ref 8–27)
Bilirubin Total: 0.3 mg/dL (ref 0.0–1.2)
CO2: 21 mmol/L (ref 20–29)
Calcium: 9.3 mg/dL (ref 8.7–10.3)
Chloride: 106 mmol/L (ref 96–106)
Creatinine, Ser: 0.77 mg/dL (ref 0.57–1.00)
Globulin, Total: 2.5 g/dL (ref 1.5–4.5)
Glucose: 98 mg/dL (ref 65–99)
Potassium: 4.8 mmol/L (ref 3.5–5.2)
Sodium: 142 mmol/L (ref 134–144)
Total Protein: 7 g/dL (ref 6.0–8.5)
eGFR: 76 mL/min/{1.73_m2} (ref >59)

## 2021-01-28 LAB — TSH+FREE T4
Free T4: 1.06 ng/dL (ref 0.82–1.77)
TSH: 3.01 u[IU]/mL (ref 0.450–4.500)

## 2021-01-28 LAB — URINE CULTURE: Culture: NO GROWTH

## 2021-01-28 LAB — HEMOGLOBIN A1C
Est. average glucose Bld gHb Est-mCnc: 143 mg/dL
Hgb A1c MFr Bld: 6.6 % — ABNORMAL HIGH (ref 4.8–5.6)

## 2021-02-03 NOTE — Progress Notes (Signed)
Labs printed and mailed to pt.  KP 

## 2021-02-06 ENCOUNTER — Telehealth: Payer: Self-pay

## 2021-02-06 NOTE — Telephone Encounter (Signed)
Copied from CRM [id number]. Topic: General - Inquiry >> Feb 06, 2021 11:48 AM Jordan Montoya D wrote: Reason for CRM: Pt called saying she had some labs and a chest xray about 2 weeks ago and has not heard anything from either one  CB#  [id number]

## 2021-02-07 IMAGING — CT CT ABDOMEN AND PELVIS WITH CONTRAST
2 of 5 series · 16 of 46 positions shown, 18 images · IV contrast (APPLIED)
Comparison: CT 01/28/2014

CLINICAL DATA: Left lower quadrant pain

EXAM:
CT ABDOMEN AND PELVIS WITH CONTRAST
TECHNIQUE: Multidetector CT imaging of the abdomen and pelvis was performed
using the standard protocol following bolus administration of
intravenous contrast.
CONTRAST:  100mL OMNIPAQUE IOHEXOL 300 MG/ML  SOLN

[Series 2: routine abd/pel with · axial · 0.81mm/px · z∈[-441,-11]mm · 13 of 98 slices shown, 15 images]
[im 6/98  soft-tissue]
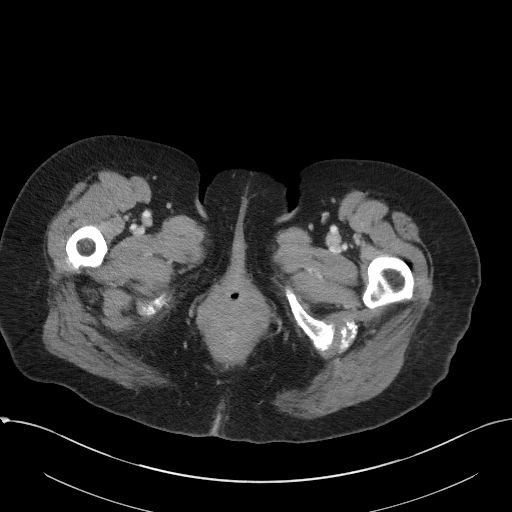
[im 6/98  bone]
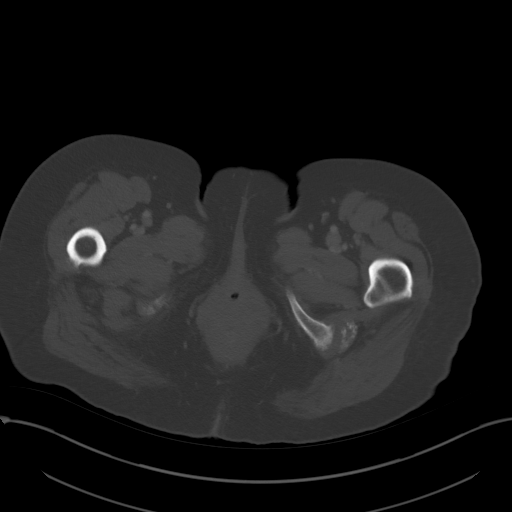
[im 16/98  soft-tissue]
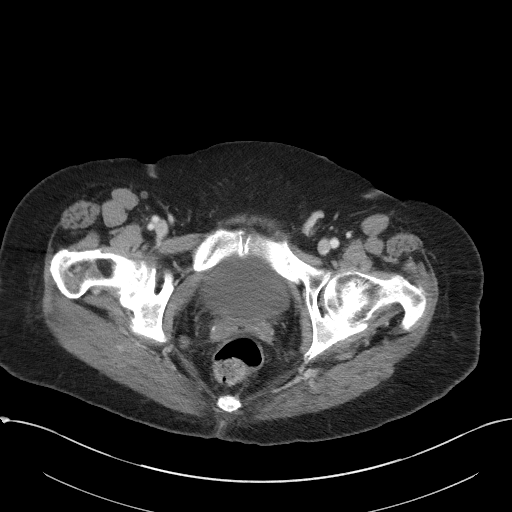
[im 21/98  soft-tissue]
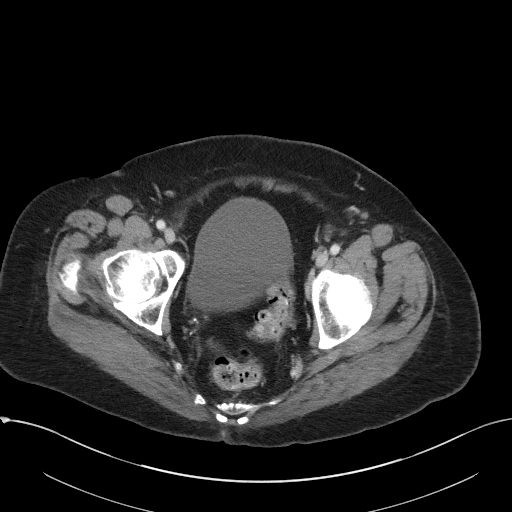
[im 26/98  soft-tissue]
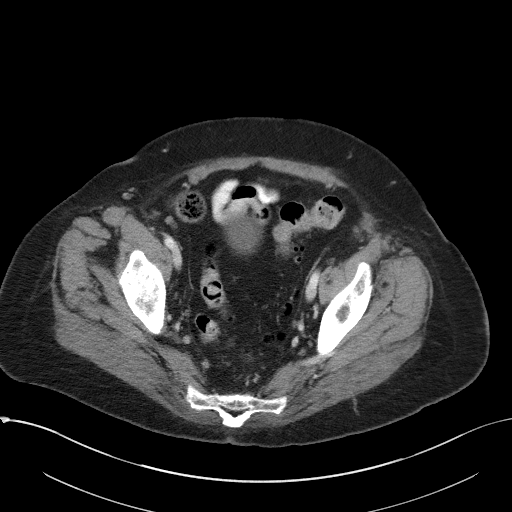
[im 36/98  soft-tissue]
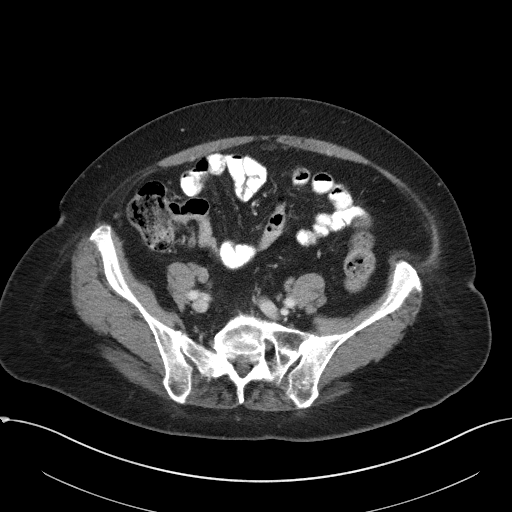
[im 41/98  soft-tissue]
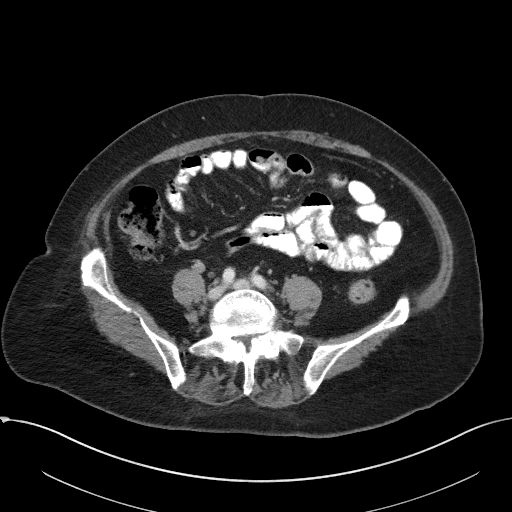
[im 52/98  soft-tissue]
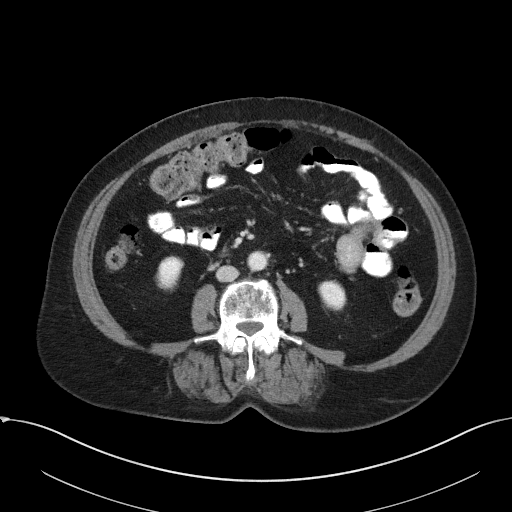
[im 57/98  soft-tissue]
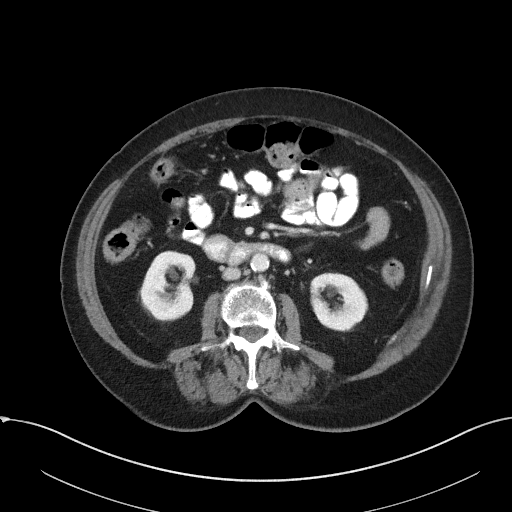
[im 62/98  soft-tissue]
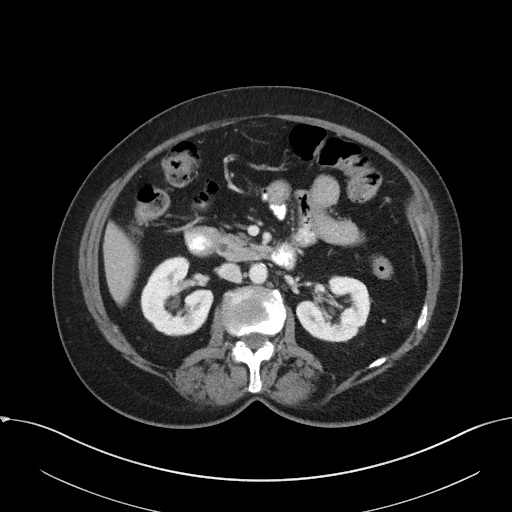
[im 62/98  bone]
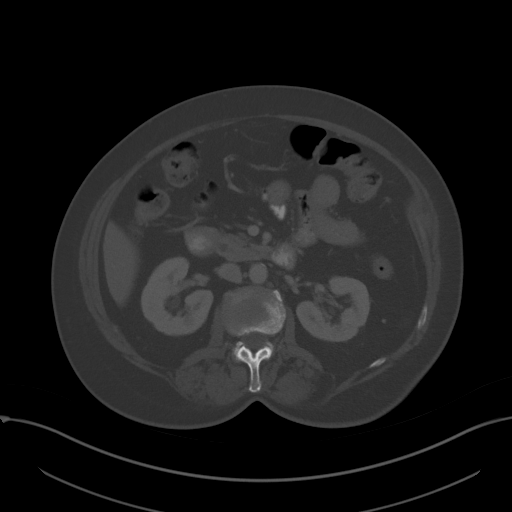
[im 72/98  soft-tissue]
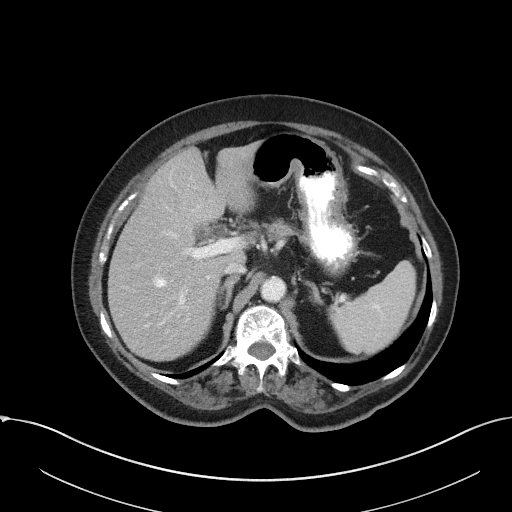
[im 77/98  soft-tissue]
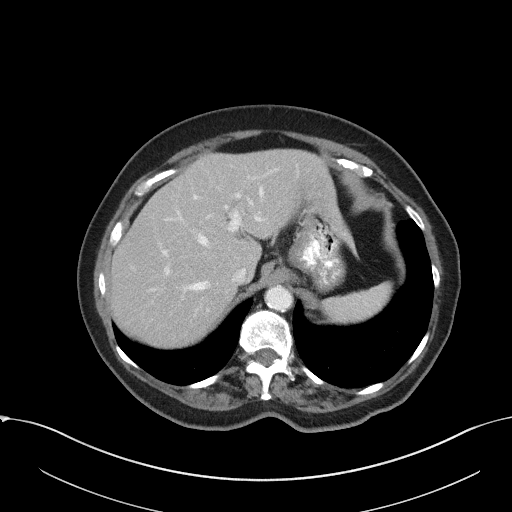
[im 82/98  soft-tissue]
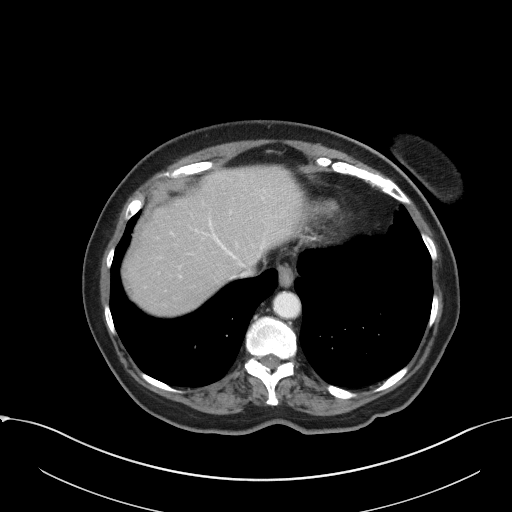
[im 92/98  soft-tissue]
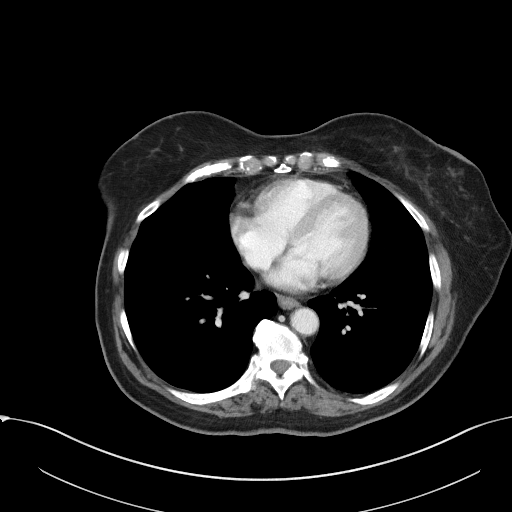

[Series 5: coronal st · coronal · 0.82mm/px · 3 of 89 slices shown]
[im 30/89  soft-tissue]
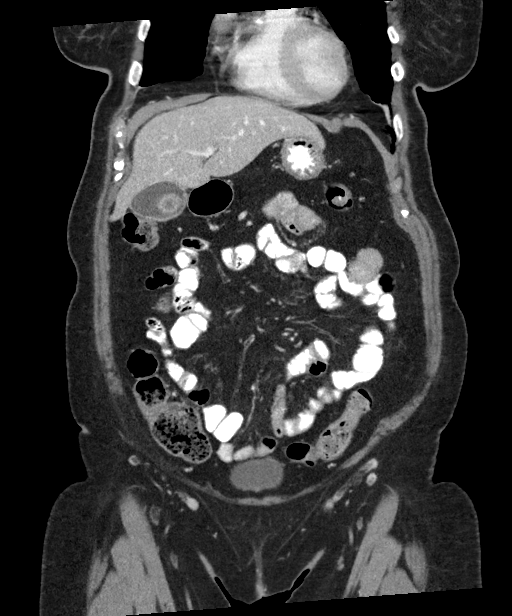
[im 40/89  soft-tissue]
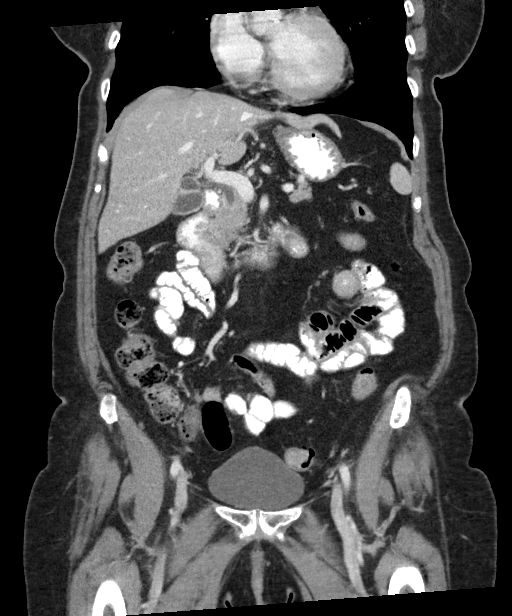
[im 49/89  soft-tissue]
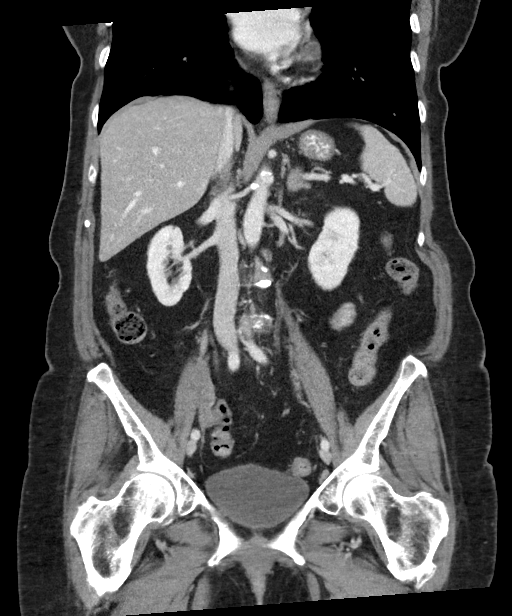

[16 of 46 positions shown; findings below may reference images not displayed]

FINDINGS: Lower chest: 7 mm average diameter subpleural right lower lobe
pulmonary nodule, series 4, image number 6. Previously this appeared
as a ground-glass nodule or may have been incompletely visualized.
No acute consolidation or effusion. Heart size is normal.

Hepatobiliary: Gallstone. No biliary dilatation or focal hepatic
abnormality

Pancreas: Unremarkable. No pancreatic ductal dilatation or
surrounding inflammatory changes.

Spleen: Normal in size without focal abnormality.

Adrenals/Urinary Tract: Adrenal glands are unremarkable. Kidneys are
normal, without renal calculi, focal lesion, or hydronephrosis.
Bladder is unremarkable.

Stomach/Bowel: Stomach is within normal limits. Appendix appears
normal. sigmoid colon diverticular disease without acute
inflammatory processNo evidence of bowel wall thickening,
distention, or inflammatory changes.

Vascular/Lymphatic: Nonaneurysmal aorta. Mild aortic
atherosclerosis. No significant adenopathy

Reproductive: Status post hysterectomy. No adnexal masses.

Other: Negative for free air or free fluid

Musculoskeletal: Degenerative changes without acute or suspicious
abnormality.
IMPRESSION: 1. No CT evidence for acute intra-abdominal or pelvic abnormality.
2. Sigmoid colon diverticular disease without acute inflammatory
process
3. 7 mm right lower lobe pulmonary nodule. Non-contrast chest CT at
6-12 months is recommended. If the nodule is stable at time of
repeat CT, then future CT at 18-24 months (from today's scan) is
considered optional for low-risk patients, but is recommended for
high-risk patients. This recommendation follows the consensus
statement: Guidelines for Management of Incidental Pulmonary Nodules
Detected on CT Images: From the [HOSPITAL] 0859; Radiology
4. Gallstone

## 2021-02-09 ENCOUNTER — Other Ambulatory Visit: Payer: Self-pay | Admitting: Internal Medicine

## 2021-02-09 ENCOUNTER — Other Ambulatory Visit: Payer: Self-pay

## 2021-02-09 DIAGNOSIS — R9389 Abnormal findings on diagnostic imaging of other specified body structures: Secondary | ICD-10-CM

## 2021-02-09 MED ORDER — TRELEGY ELLIPTA 100-62.5-25 MCG/INH IN AEPB: 1.0000 | INHALATION_SPRAY | Freq: Every day | RESPIRATORY_TRACT | 1 refills | Status: DC

## 2021-02-09 NOTE — Telephone Encounter (Signed)
Pt would like an inhaler sent in to [facility] in [place] per labs. Referral was sent in for pulmonologist. Pt has appt with cardio on 02/25/2021.   KP

## 2021-02-09 NOTE — Telephone Encounter (Signed)
Pt verbalized understanding.  KP 

## 2021-02-09 NOTE — Telephone Encounter (Signed)
Called pt let her know that her inhaler was sent to the pharmacy.  KP

## 2021-02-09 NOTE — Telephone Encounter (Signed)
Pt is calling and would like kiendra to return her call. Pt has received the letter of her results and pt states she does not have pulmonologist and would like to know if dr berglund could call in inhaler to [facility] phone number [phone]

## 2021-02-09 NOTE — Telephone Encounter (Signed)
Tried to call pt X2 phone is busy could not leave VM.  [facility] nurse may give results to patient if they return call to clinic, a CRM has been created.  KP

## 2021-02-17 ENCOUNTER — Telehealth: Payer: Self-pay

## 2021-02-17 NOTE — Telephone Encounter (Signed)
Called pt let her know that the closest place is [place]. Pt stated she would call them back and schedule an appt.  KP

## 2021-02-17 NOTE — Telephone Encounter (Signed)
Copied from CRM [id number]. Topic: General - Other >> Feb 17, 2021 10:01 AM Gwenlyn Fudge wrote: Reason for CRM: Pt called stating that the pulmonologist in [facility] called her. She states that she would prefer to have something in [place] that is closer. Please advise.

## 2021-02-24 ENCOUNTER — Encounter: Payer: Self-pay | Admitting: Urology

## 2021-02-24 ENCOUNTER — Other Ambulatory Visit: Payer: Self-pay

## 2021-02-24 ENCOUNTER — Other Ambulatory Visit
Admission: RE | Admit: 2021-02-24 | Discharge: 2021-02-24 | Disposition: A | Discharge status: Home / Self Care | Payer: Medicare PPO | Attending: Urology | Admitting: Urology

## 2021-02-24 ENCOUNTER — Other Ambulatory Visit: Payer: Self-pay | Admitting: *Deleted

## 2021-02-24 ENCOUNTER — Ambulatory Visit (INDEPENDENT_AMBULATORY_CARE_PROVIDER_SITE_OTHER): Payer: Medicare PPO | Admitting: Urology

## 2021-02-24 VITALS — BP 164/76 | HR 70 | Ht 66.0 in | Wt 167.0 lb

## 2021-02-24 DIAGNOSIS — N390 Urinary tract infection, site not specified: Secondary | ICD-10-CM | POA: Insufficient documentation

## 2021-02-24 LAB — URINALYSIS, COMPLETE (UACMP) WITH MICROSCOPIC
Bilirubin Urine: NEGATIVE
Glucose, UA: NEGATIVE mg/dL
Ketones, ur: NEGATIVE mg/dL
Nitrite: NEGATIVE
Specific Gravity, Urine: 1.015 (ref 1.005–1.030)
WBC, UA: >50 WBC/hpf (ref 0–5)
pH: 5.5 (ref 5.0–8.0)

## 2021-02-24 MED ORDER — NITROFURANTOIN MONOHYD MACRO 100 MG PO CAPS: 100.0000 mg | ORAL_CAPSULE | Freq: Two times a day (BID) | ORAL | 0 refills | Status: DC

## 2021-02-24 MED ORDER — NITROFURANTOIN MACROCRYSTAL 50 MG PO CAPS: 50.0000 mg | ORAL_CAPSULE | Freq: Every day | ORAL | 3 refills | Status: DC

## 2021-02-24 NOTE — Patient Instructions (Signed)
I sent in nitrofurantoin 100 mg twice daily x7 days, then 50 mg nitrofurantoin daily prophylaxis for the next year.

## 2021-02-24 NOTE — Progress Notes (Signed)
   02/24/2021 11:26 AM   Vinnie Langton Oneita Kras 10-01-1933 [id number]  Reason for visit: Follow up recurrent UTIs  HPI: Frail 87 year old female with recurrent E. coli UTIs.  She is on cranberry tablet prophylaxis and Premarin cream.  She is not always compliant with the Premarin cream.  She feels like she has a UTI today with pelvic pain.  Previously, she was on 6 months of Macrobid 50 mg daily prophylaxis which had completely resolved her UTIs and she was doing well.  She is interested in resuming Macrobid prophylaxis.   Urinalysis today suspicious for infection with 0-5 squamous cells, greater than 50 WBCs, moderate leukocytes, nitrite negative, 0-5 RBCs, few bacteria.  Sent for culture  -I recommended nitrofurantoin 100 mg twice daily x7 days for acute UTI, will follow-up culture results -Recommend continuing Premarin cream, cranberry tablet prophylaxis, and continuing nitrofurantoin 50 mg daily prophylaxis indefinitely  Sondra Come, MD  [facility] [address] [place], [place] [address] [phone]

## 2021-02-25 DIAGNOSIS — R0602 Shortness of breath: Secondary | ICD-10-CM | POA: Diagnosis not present

## 2021-02-25 DIAGNOSIS — I471 Supraventricular tachycardia: Secondary | ICD-10-CM | POA: Diagnosis not present

## 2021-02-25 DIAGNOSIS — E7800 Pure hypercholesterolemia, unspecified: Secondary | ICD-10-CM | POA: Diagnosis not present

## 2021-02-25 DIAGNOSIS — I10 Essential (primary) hypertension: Secondary | ICD-10-CM | POA: Diagnosis not present

## 2021-02-27 ENCOUNTER — Telehealth: Payer: Self-pay

## 2021-02-27 DIAGNOSIS — B961 Klebsiella pneumoniae [K. pneumoniae] as the cause of diseases classified elsewhere: Secondary | ICD-10-CM

## 2021-02-27 DIAGNOSIS — N3090 Cystitis, unspecified without hematuria: Secondary | ICD-10-CM

## 2021-02-27 LAB — CULTURE, URINE COMPREHENSIVE

## 2021-02-27 MED ORDER — SULFAMETHOXAZOLE-TRIMETHOPRIM 800-160 MG PO TABS: 1.0000 | ORAL_TABLET | Freq: Two times a day (BID) | ORAL | 0 refills | Status: AC

## 2021-02-27 NOTE — Telephone Encounter (Signed)
Called pt informed her of the information below. Pt gave verbal understanding, RX sent in.

## 2021-02-27 NOTE — Telephone Encounter (Signed)
-----   Message from Sondra Come, MD sent at 02/27/2021 11:35 AM EDT ----- Please change her to Bactrim DS twice daily x3 days, then she can resume the daily low-dose nitrofurantoin prophylaxis  Legrand Rams, MD 02/27/2021

## 2021-03-02 ENCOUNTER — Ambulatory Visit: Payer: Medicare PPO | Admitting: Urology

## 2021-03-02 LAB — MYCOPLASMA / UREAPLASMA CULTURE
Mycoplasma hominis Culture: NEGATIVE
Ureaplasma urealyticum: NEGATIVE

## 2021-03-05 ENCOUNTER — Other Ambulatory Visit: Payer: Self-pay | Admitting: Internal Medicine

## 2021-03-05 DIAGNOSIS — F411 Generalized anxiety disorder: Secondary | ICD-10-CM

## 2021-03-05 NOTE — Telephone Encounter (Signed)
Requested medication (s) are due for refill today: Yes  Requested medication (s) are on the active medication list: Yes  Last refill:  10/20/20  Future visit scheduled: Yes  Notes to clinic:  See request.    Requested Prescriptions  Pending Prescriptions Disp Refills   ALPRAZolam (XANAX) 0.25 MG tablet [Pharmacy Med Name: ALPRAZOLAM 0.25 MG TABLET] 30 tablet 0    Sig: TAKE 1 TABLET (0.25 MG TOTAL) BY MOUTH DAILY AS NEEDED. FOR ANXIETY      Not Delegated - Psychiatry:  Anxiolytics/Hypnotics Failed - 03/05/2021  9:56 AM      Failed - This refill cannot be delegated      Failed - Urine Drug Screen completed in last 360 days      Passed - Valid encounter within last 6 months    Recent Outpatient Visits           1 month ago PSVT (paroxysmal supraventricular tachycardia) [facility]   [facility] Reubin Milan, MD   4 months ago Post herpetic neuralgia   [facility] Reubin Milan, MD   11 months ago Annual physical exam   [facility] Reubin Milan, MD   1 year ago Acute cystitis without hematuria   [facility] Reubin Milan, MD   1 year ago Acute cystitis without hematuria   [facility] Reubin Milan, MD       Future Appointments             In 1 month Judithann Graves Nyoka Cowden, MD [facility], [facility]

## 2021-03-06 NOTE — Telephone Encounter (Signed)
*  01/27/2021

## 2021-03-06 NOTE — Telephone Encounter (Signed)
Please review last office visit 01/07/2021.  KP

## 2021-03-08 ENCOUNTER — Other Ambulatory Visit: Payer: Self-pay | Admitting: Urology

## 2021-03-12 ENCOUNTER — Other Ambulatory Visit: Payer: Self-pay | Admitting: Internal Medicine

## 2021-03-12 DIAGNOSIS — B029 Zoster without complications: Secondary | ICD-10-CM

## 2021-03-12 NOTE — Telephone Encounter (Signed)
Requested medication (s) are due for refill today: yes  Requested medication (s) are on the active medication list: yes   Last refill:  02/09/2021  Future visit scheduled: yes  Notes to clinic:  this refill cannot be delegated    Requested Prescriptions  Pending Prescriptions Disp Refills   pregabalin (LYRICA) 100 MG capsule [Pharmacy Med Name: PREGABALIN 100 MG CAPSULE] 60 capsule 2    Sig: TAKE 1 CAPSULE BY MOUTH TWICE A DAY     Not Delegated - Neurology:  Anticonvulsants - Controlled Failed - 03/12/2021  9:16 AM      Failed - This refill cannot be delegated      Passed - Valid encounter within last 12 months    Recent Outpatient Visits           1 month ago PSVT (paroxysmal supraventricular tachycardia) [facility])   [facility] Reubin Milan, MD   4 months ago Post herpetic neuralgia   [facility] Reubin Milan, MD   11 months ago Annual physical exam   [facility] Reubin Milan, MD   1 year ago Acute cystitis without hematuria   [facility] Reubin Milan, MD   1 year ago Acute cystitis without hematuria   [facility] Reubin Milan, MD       Future Appointments             In 4 weeks Judithann Graves Nyoka Cowden, MD [facility], [facility]

## 2021-03-12 NOTE — Telephone Encounter (Signed)
Please review. Last office visit 01/27/2021.  KP

## 2021-03-19 DIAGNOSIS — R0602 Shortness of breath: Secondary | ICD-10-CM | POA: Diagnosis not present

## 2021-03-31 DIAGNOSIS — I10 Essential (primary) hypertension: Secondary | ICD-10-CM | POA: Diagnosis not present

## 2021-03-31 DIAGNOSIS — E7800 Pure hypercholesterolemia, unspecified: Secondary | ICD-10-CM | POA: Diagnosis not present

## 2021-04-02 NOTE — Telephone Encounter (Signed)
complete

## 2021-04-09 ENCOUNTER — Ambulatory Visit (INDEPENDENT_AMBULATORY_CARE_PROVIDER_SITE_OTHER): Payer: Medicare PPO | Admitting: Internal Medicine

## 2021-04-09 ENCOUNTER — Other Ambulatory Visit: Payer: Self-pay

## 2021-04-09 ENCOUNTER — Encounter: Payer: Self-pay | Admitting: Internal Medicine

## 2021-04-09 VITALS — BP 140/82 | HR 77 | Temp 98.0°F | Ht 66.0 in | Wt 168.0 lb

## 2021-04-09 DIAGNOSIS — I10 Essential (primary) hypertension: Secondary | ICD-10-CM

## 2021-04-09 DIAGNOSIS — E118 Type 2 diabetes mellitus with unspecified complications: Secondary | ICD-10-CM | POA: Diagnosis not present

## 2021-04-09 DIAGNOSIS — E785 Hyperlipidemia, unspecified: Secondary | ICD-10-CM

## 2021-04-09 DIAGNOSIS — Z0000 Encounter for general adult medical examination without abnormal findings: Secondary | ICD-10-CM | POA: Diagnosis not present

## 2021-04-09 DIAGNOSIS — E1169 Type 2 diabetes mellitus with other specified complication: Secondary | ICD-10-CM

## 2021-04-09 DIAGNOSIS — F411 Generalized anxiety disorder: Secondary | ICD-10-CM | POA: Diagnosis not present

## 2021-04-09 DIAGNOSIS — B0229 Other postherpetic nervous system involvement: Secondary | ICD-10-CM

## 2021-04-09 DIAGNOSIS — Z1231 Encounter for screening mammogram for malignant neoplasm of breast: Secondary | ICD-10-CM

## 2021-04-09 MED ORDER — PREGABALIN 100 MG PO CAPS: 100.0000 mg | ORAL_CAPSULE | Freq: Three times a day (TID) | ORAL | 5 refills | Status: DC

## 2021-04-09 NOTE — Progress Notes (Signed)
Date:  04/09/2021   Name:  Jordan Montoya   DOB:  08-30-1933   MRN:  [id number]   Chief Complaint: Annual Exam (Breast exam no pap ) and Flu Vaccine Clide Dales Adan is a 87 y.o. female who presents today for her Complete Annual Exam. She feels well. She reports exercising walking around house as much as possible. She reports she is sleeping well. Breast complaints none.  Mammogram: 06/2020 DEXA: 06/2020 - normal spine, osteoporosis hip Colonoscopy: 2004 - aged out  Immunization History  Administered Date(s) Administered   Fluad Quad(high Dose 65+) 05/24/2019   Influenza, High Dose Seasonal PF 05/01/2018, 06/03/2020   Influenza-Unspecified 05/04/2016, 05/24/2017, 05/04/2018   Moderna Sars-Covid-2 Vaccination 10/01/2019, 10/29/2019, 07/02/2020   Pneumococcal Conjugate-13 06/06/2014   Pneumococcal Polysaccharide-23 06/16/2017   Tdap 08/20/2011   Zoster Recombinat (Shingrix) 12/06/2019, 03/03/2020    Diabetes She presents for her follow-up diabetic visit. She has type 2 diabetes mellitus. Her disease course has been stable. Hypoglycemia symptoms include nervousness/anxiousness. Pertinent negatives for hypoglycemia include no dizziness, headaches or tremors. Pertinent negatives for diabetes include no chest pain, no fatigue, no polydipsia and no polyuria. Pertinent negatives for diabetic complications include no CVA. Current diabetic treatment includes oral agent (monotherapy). An ACE inhibitor/angiotensin II receptor blocker is not being taken.  Hypertension This is a chronic problem. The problem is controlled. Pertinent negatives include no chest pain, headaches, palpitations or shortness of breath. Past treatments include beta blockers. Hypertensive end-organ damage includes CAD/MI. There is no history of kidney disease or CVA.  Back Pain This is a chronic problem. The pain is present in the lumbar spine. The pain radiates to the left foot and right foot. The pain is moderate.  The pain is Worse during the day. Associated symptoms include numbness. Pertinent negatives include no abdominal pain, chest pain, dysuria, fever or headaches.   Lab Results  Component Value Date   CREATININE 0.77 01/27/2021   BUN 16 01/27/2021   NA 142 01/27/2021   K 4.8 01/27/2021   CL 106 01/27/2021   CO2 21 01/27/2021   Lab Results  Component Value Date   CHOL 215 (H) 04/02/2020   HDL 67 04/02/2020   LDLCALC 119 (H) 04/02/2020   TRIG 165 (H) 04/02/2020   CHOLHDL 3.2 04/02/2020   Lab Results  Component Value Date   TSH 3.010 01/27/2021   Lab Results  Component Value Date   HGBA1C 6.6 (H) 01/27/2021   Lab Results  Component Value Date   WBC 6.5 01/27/2021   HGB 12.4 01/27/2021   HCT 37.9 01/27/2021   MCV 89 01/27/2021   PLT 262 01/27/2021   Lab Results  Component Value Date   ALT 13 01/27/2021   AST 18 01/27/2021   ALKPHOS 78 01/27/2021   BILITOT 0.3 01/27/2021     Review of Systems  Constitutional:  Negative for chills, fatigue and fever.  HENT:  Negative for congestion, hearing loss, tinnitus, trouble swallowing and voice change.   Eyes:  Negative for visual disturbance.  Respiratory:  Negative for cough, chest tightness, shortness of breath and wheezing.   Cardiovascular:  Negative for chest pain, palpitations and leg swelling.  Gastrointestinal:  Negative for abdominal pain, constipation, diarrhea and vomiting.  Endocrine: Negative for polydipsia and polyuria.  Genitourinary:  Negative for dysuria, frequency, genital sores, vaginal bleeding and vaginal discharge.  Musculoskeletal:  Positive for back pain and gait problem. Negative for arthralgias and joint swelling.  Skin:  Negative for color change  and rash.  Neurological:  Positive for numbness. Negative for dizziness, tremors, light-headedness and headaches.  Hematological:  Negative for adenopathy. Does not bruise/bleed easily.  Psychiatric/Behavioral:  Positive for sleep disturbance. Negative for  dysphoric mood. The patient is nervous/anxious.    Patient Active Problem List   Diagnosis Date Noted   Benign essential hypertension 04/09/2021   PSVT (paroxysmal supraventricular tachycardia) (HCC) 01/27/2021   Chronic pain of right knee 10/23/2020   Osteoporosis 06/04/2020   Chronic pain syndrome 06/11/2019   Post herpetic neuralgia 03/20/2019   Spinal stenosis of lumbar region at multiple levels 03/16/2019   Anxiety disorder 11/16/2018   Primary osteoarthritis of both hands 08/16/2018   Arthralgia of multiple sites 05/18/2018   Pernicious anemia 12/29/2017   Type II diabetes mellitus with complication (HCC) 09/30/2015   Environmental and seasonal allergies 05/30/2015   Hip pain, chronic 01/27/2015   Avitaminosis D 11/25/2014   Psoriasis 11/25/2014   Hyperlipidemia associated with type 2 diabetes mellitus (HCC) 08/20/2011    Allergies  Allergen Reactions   Metformin Nausea Only   Macrobid [Nitrofurantoin] Rash    Past Surgical History:  Procedure Laterality Date   ABDOMINAL HYSTERECTOMY     BREAST BIOPSY Left    neg-bx/clip   BREAST CYST EXCISION Left    neg   EYE SURGERY Bilateral    cataracts   PARTIAL HYSTERECTOMY     one ovary remains    Social History   Tobacco Use   Smoking status: Former    Packs/day: 0.25    Years: 20.00    Pack years: 5.00    Types: Cigarettes    Quit date: 10/10/1979    Years since quitting: 41.5   Smokeless tobacco: Never  Vaping Use   Vaping Use: Never used  Substance Use Topics   Alcohol use: No    Alcohol/week: 0.0 standard drinks   Drug use: No     Medication list has been reviewed and updated.  Current Meds  Medication Sig   Acetaminophen (TYLENOL ARTHRITIS EXT RELIEF PO) Take by mouth daily as needed.   ALPRAZolam (XANAX) 0.25 MG tablet TAKE 1 TABLET (0.25 MG TOTAL) BY MOUTH DAILY AS NEEDED. FOR ANXIETY   aspirin 325 MG EC tablet Take 325 mg by mouth daily. As needed   COSENTYX SENSOREADY, 300 MG, 150 MG/ML SOAJ     CRANBERRY PO Take 2 tablets by mouth daily.   docusate sodium (COLACE) 100 MG capsule Take 200 mg by mouth 2 (two) times daily as needed for mild constipation.   Fluticasone-Umeclidin-Vilant (TRELEGY ELLIPTA) 100-62.5-25 MCG/INH AEPB Inhale 1 puff into the lungs daily.   JANUVIA 100 MG tablet TAKE 1 TABLET (100 MG TOTAL) BY MOUTH DAILY.   MYRBETRIQ 25 MG TB24 tablet TAKE 1 TABLET BY MOUTH EVERY DAY   nitrofurantoin (MACRODANTIN) 50 MG capsule Take 1 capsule (50 mg total) by mouth daily.   pregabalin (LYRICA) 100 MG capsule Take 1 capsule (100 mg total) by mouth 3 (three) times daily.   Probiotic Product (PROBIOTIC-10 PO) Take by mouth.   traMADol-acetaminophen (ULTRACET) 37.5-325 MG tablet Take 1 tablet by mouth 2 (two) times daily as needed.    PHQ 2/9 Scores 04/09/2021 01/27/2021 10/23/2020 04/23/2020  PHQ - 2 Score 0 0 2 0  PHQ- 9 Score 0 1 2 -    GAD 7 : Generalized Anxiety Score 04/09/2021 01/27/2021 10/23/2020 01/17/2020  Nervous, Anxious, on Edge 0 0 0 0  Control/stop worrying 0 1 0 0  Worry too much -  different things 0 1 0 0  Trouble relaxing 0 0 0 0  Restless 0 0 0 0  Easily annoyed or irritable 0 0 0 0  Afraid - awful might happen 0 0 0 0  Total GAD 7 Score 0 2 0 0  Anxiety Difficulty - - Not difficult at all -    BP Readings from Last 3 Encounters:  04/09/21 140/82  02/24/21 (!) 164/76  01/27/21 (!) 160/78    Physical Exam Vitals and nursing note reviewed.  Constitutional:      General: She is not in acute distress.    Appearance: She is well-developed.  HENT:     Head: Normocephalic and atraumatic.     Right Ear: Tympanic membrane and ear canal normal.     Left Ear: Tympanic membrane and ear canal normal.     Nose:     Right Sinus: No maxillary sinus tenderness.     Left Sinus: No maxillary sinus tenderness.  Eyes:     General: No scleral icterus.       Right eye: No discharge.        Left eye: No discharge.     Conjunctiva/sclera: Conjunctivae normal.  Neck:      Thyroid: No thyromegaly.     Vascular: No carotid bruit.  Cardiovascular:     Rate and Rhythm: Normal rate and regular rhythm.     Pulses: Normal pulses.     Heart sounds: Normal heart sounds.  Pulmonary:     Effort: Pulmonary effort is normal. No respiratory distress.     Breath sounds: No wheezing.  Chest:  Breasts:    Right: No mass, nipple discharge, skin change or tenderness.     Left: No mass, nipple discharge, skin change or tenderness.  Abdominal:     General: Bowel sounds are normal.     Palpations: Abdomen is soft.     Tenderness: There is no abdominal tenderness.  Musculoskeletal:     Cervical back: Normal range of motion. No erythema.     Right lower leg: No edema.     Left lower leg: No edema.  Lymphadenopathy:     Cervical: No cervical adenopathy.  Skin:    General: Skin is warm and dry.     Findings: No rash.  Neurological:     Mental Status: She is alert and oriented to person, place, and time.     Cranial Nerves: No cranial nerve deficit.     Sensory: No sensory deficit.     Deep Tendon Reflexes: Reflexes are normal and symmetric.  Psychiatric:        Attention and Perception: Attention normal.        Mood and Affect: Mood normal.    Wt Readings from Last 3 Encounters:  04/09/21 168 lb (76.2 kg)  02/24/21 167 lb (75.8 kg)  01/27/21 166 lb (75.3 kg)    BP 140/82   Pulse 77   Temp 98 F (36.7 C) (Oral)   Ht 5\' 6"  (1.676 m)   Wt 168 lb (76.2 kg)   SpO2 95%   BMI 27.12 kg/m   Assessment and Plan: 1. Annual physical exam Continue healthy diet, activity as tolerated  2. [facility] BRE[facility] BILATERAL  3. Type II diabetes mellitus with complication (HCC) Clinically stable by exam and report without s/s of hypoglycemia. DM complicated by hypertension and dyslipidemia. Tolerating medications well without side effects or other  concerns.  4. Post herpetic neuralgia Will  increase Lyrica to TID - pregabalin (LYRICA) 100 MG capsule; Take 1 capsule (100 mg total) by mouth 3 (three) times daily.  Dispense: 90 capsule; Refill: 5  5. Hyperlipidemia associated with type 2 diabetes mellitus (HCC) Not currently taking medication This is reasonable due to age, well controlled BP and polypharmacy.  6. Benign essential hypertension BP is controlled on no medication currently. She was previously taking low dose metoprolol but this was stopped by Cardiology.  7. Generalized anxiety disorder And insomnia - continue to use low dose Xanax as needed. Will refill PRN.   Partially dictated using Animal nutritionist. Any errors are unintentional.  Bari Edward, MD [facility]  04/09/2021

## 2021-04-27 ENCOUNTER — Ambulatory Visit: Payer: Medicare PPO

## 2021-04-28 ENCOUNTER — Institutional Professional Consult (permissible substitution): Payer: Medicare PPO | Admitting: Internal Medicine

## 2021-05-06 ENCOUNTER — Other Ambulatory Visit: Payer: Self-pay | Admitting: Internal Medicine

## 2021-05-06 DIAGNOSIS — F411 Generalized anxiety disorder: Secondary | ICD-10-CM

## 2021-05-06 NOTE — Telephone Encounter (Signed)
Requested medication (s) are due for refill today Yes  Requested medication (s) are on the active medication list Yes  Future visit scheduled Yes 08/10/21  LOV 04/09/21.  Note to clinic-Medication is not delegated. Patient is overdue for urine drug screen. Routing to provider for review.   Requested Prescriptions  Pending Prescriptions Disp Refills   ALPRAZolam (XANAX) 0.25 MG tablet [Pharmacy Med Name: ALPRAZOLAM 0.25 MG TABLET] 30 tablet 0    Sig: TAKE 1 TABLET (0.25 MG TOTAL) BY MOUTH DAILY AS NEEDED. FOR ANXIETY     Not Delegated - Psychiatry:  Anxiolytics/Hypnotics Failed - 05/06/2021  9:23 AM      Failed - This refill cannot be delegated      Failed - Urine Drug Screen completed in last 360 days      Passed - Valid encounter within last 6 months    Recent Outpatient Visits           3 weeks ago Annual physical exam   [facility] Reubin Milan, MD   3 months ago PSVT (paroxysmal supraventricular tachycardia) [facility])   [facility] Reubin Milan, MD   6 months ago Post herpetic neuralgia   [facility] Reubin Milan, MD   1 year ago Annual physical exam   [facility] Reubin Milan, MD   1 year ago Acute cystitis without hematuria   [facility] Reubin Milan, MD       Future Appointments             In 3 months Judithann Graves Nyoka Cowden, MD [facility], [facility]

## 2021-05-06 NOTE — Telephone Encounter (Signed)
Please review. Last office visit 04/09/2021. ° °KP

## 2021-05-08 ENCOUNTER — Telehealth: Payer: Self-pay

## 2021-05-08 DIAGNOSIS — K5900 Constipation, unspecified: Secondary | ICD-10-CM | POA: Diagnosis not present

## 2021-05-08 DIAGNOSIS — F324 Major depressive disorder, single episode, in partial remission: Secondary | ICD-10-CM | POA: Diagnosis not present

## 2021-05-08 DIAGNOSIS — M1990 Unspecified osteoarthritis, unspecified site: Secondary | ICD-10-CM | POA: Diagnosis not present

## 2021-05-08 DIAGNOSIS — F411 Generalized anxiety disorder: Secondary | ICD-10-CM | POA: Diagnosis not present

## 2021-05-08 DIAGNOSIS — G8929 Other chronic pain: Secondary | ICD-10-CM | POA: Diagnosis not present

## 2021-05-08 DIAGNOSIS — L4050 Arthropathic psoriasis, unspecified: Secondary | ICD-10-CM | POA: Diagnosis not present

## 2021-05-08 DIAGNOSIS — E1142 Type 2 diabetes mellitus with diabetic polyneuropathy: Secondary | ICD-10-CM | POA: Diagnosis not present

## 2021-05-08 DIAGNOSIS — G4700 Insomnia, unspecified: Secondary | ICD-10-CM | POA: Diagnosis not present

## 2021-05-08 DIAGNOSIS — J439 Emphysema, unspecified: Secondary | ICD-10-CM | POA: Diagnosis not present

## 2021-05-08 NOTE — Telephone Encounter (Signed)
Copied from CRM [id number]. Topic: Appointment Scheduling - Scheduling Inquiry for Clinic >> May 08, 2021  3:15 PM Leafy Ro wrote: Reason for CRM: Pt is calling and said she just had awv from [employer] who works for [employer]

## 2021-05-13 ENCOUNTER — Ambulatory Visit (INDEPENDENT_AMBULATORY_CARE_PROVIDER_SITE_OTHER): Payer: Medicare PPO

## 2021-05-13 DIAGNOSIS — Z0000 Encounter for general adult medical examination without abnormal findings: Secondary | ICD-10-CM | POA: Diagnosis not present

## 2021-05-13 NOTE — Progress Notes (Signed)
Subjective:   Jordan Montoya is a 87 y.o. female who presents for Medicare Annual (Subsequent) preventive examination.  Virtual Visit via Telephone Note  I connected with  [name] on 05/13/21 at 10:00 AM EDT by telephone and verified that I am speaking with the correct person using two identifiers.  Location: Patient: home Provider: [facility] Persons participating in the virtual visit: patient/Nurse Health Advisor   I discussed the limitations, risks, security and privacy concerns of performing an evaluation and management service by telephone and the availability of in person appointments. The patient expressed understanding and agreed to proceed.  Interactive audio and video telecommunications were attempted between this nurse and patient, however failed, due to patient having technical difficulties OR patient did not have access to video capability.  We continued and completed visit with audio only.  Some vital signs may be absent or patient reported.   Reather Littler, LPN   Review of Systems     Cardiac Risk Factors include: advanced age (>77men, >66 women);diabetes mellitus;dyslipidemia     Objective:    Today's Vitals   05/13/21 1003  PainSc: 6    There is no height or weight on file to calculate BMI.  Advanced Directives 05/13/2021 04/23/2020 04/13/2019 04/13/2019 02/28/2019 12/20/2018 12/09/2018  Does Patient Have a Medical Advance Directive? Yes Yes Yes Yes No No Yes  Type of Estate agent of [place];Living will Healthcare Power of [place];Living will Healthcare Power of [place];Living will Healthcare Power of [place];Living will - Living will;Healthcare Power of Attorney Living will  Does patient want to make changes to medical advance directive? - - - - - - No - Patient declined  Copy of Healthcare Power of Attorney in Chart? No - copy requested No - copy requested No - copy requested - - - -  Would patient like information on creating  a medical advance directive? - - - - No - Patient declined - -    Current Medications (verified) Outpatient Encounter Medications as of 05/13/2021  Medication Sig   Acetaminophen (TYLENOL ARTHRITIS EXT RELIEF PO) Take by mouth daily as needed.   ALPRAZolam (XANAX) 0.25 MG tablet TAKE 1 TABLET (0.25 MG TOTAL) BY MOUTH DAILY AS NEEDED. FOR ANXIETY   conjugated estrogens (PREMARIN) vaginal cream Apply 0.5mg  (pea-sized amount)  just inside the vaginal introitus with a finger-tip on  Monday, Wednesday and Friday nights.   COSENTYX SENSOREADY, 300 MG, 150 MG/ML SOAJ    docusate sodium (COLACE) 100 MG capsule Take 200 mg by mouth 2 (two) times daily as needed for mild constipation.   Fluticasone-Umeclidin-Vilant (TRELEGY ELLIPTA) 100-62.5-25 MCG/INH AEPB Inhale 1 puff into the lungs daily.   JANUVIA 100 MG tablet TAKE 1 TABLET (100 MG TOTAL) BY MOUTH DAILY.   MYRBETRIQ 25 MG TB24 tablet TAKE 1 TABLET BY MOUTH EVERY DAY   naproxen sodium (ALEVE) 220 MG tablet Take 220 mg by mouth.   nitrofurantoin (MACRODANTIN) 50 MG capsule Take 1 capsule (50 mg total) by mouth daily.   pregabalin (LYRICA) 100 MG capsule Take 1 capsule (100 mg total) by mouth 3 (three) times daily.   Probiotic Product (PROBIOTIC-10 PO) Take by mouth.   traMADol-acetaminophen (ULTRACET) 37.5-325 MG tablet Take 1 tablet by mouth 2 (two) times daily as needed.   CRANBERRY PO Take 2 tablets by mouth daily. (Patient not taking: Reported on 05/13/2021)   [DISCONTINUED] aspirin 325 MG EC tablet Take 325 mg by mouth daily. As needed   No facility-administered encounter medications  on file as of 05/13/2021.    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   Psoriasis 11/25/2014   followed by Dermatology    Shingles 11/2018   Type 2 diabetes mellitus with hyperosmolarity without coma, without long-term current use of insulin (HCC) 09/30/2015   Unspecified septicemia(038.9) (HCC) 02/28/2019   Past Surgical History:  Procedure Laterality Date   ABDOMINAL HYSTERECTOMY     BREAST BIOPSY Left    neg-bx/clip   BREAST CYST EXCISION Left    neg   EYE SURGERY Bilateral    cataracts   PARTIAL HYSTERECTOMY     one ovary remains   Family History  Problem Relation Age of Onset   Cancer Mother    Heart disease Father    Diabetes Son    Breast cancer Neg Hx    Social History   Socioeconomic History   Marital status: Widowed    Spouse name: Not on file   Number of children: 2   Years of education: Not on file   Highest education level: 11th grade  Occupational History   Occupation: Retired  Tobacco Use   Smoking status: Former    Packs/day: 0.25    Years: 20.00    Pack years: 5.00    Types: Cigarettes    Quit date: 10/10/1979    Years since quitting: 41.6   Smokeless tobacco: Never   Tobacco comments:    Smoking cessation not required  Vaping Use   Vaping Use: Never used  Substance and Sexual Activity   Alcohol use: No    Alcohol/week: 0.0 standard drinks   Drug use: No   Sexual activity: Not Currently  Other Topics Concern   Not on file  Social History Narrative   Pt lives alone.    Social Determinants of Health   Financial Resource Strain: Low Risk    Difficulty of Paying Living Expenses: Not hard at all  Food Insecurity: No Food Insecurity   Worried About Programme researcher, broadcasting/film/video in the Last Year: Never true   Ran Out of Food in the Last Year: Never true  Transportation Needs: No Transportation Needs   Lack of Transportation (Medical): No   Lack of Transportation (Non-Medical): No  Physical Activity: Inactive   Days of Exercise per Week: 0 days   Minutes of Exercise per  Session: 0 min  Stress: Stress Concern Present   Feeling of Stress : To some extent  Social Connections: Moderately Isolated   Frequency of Communication with Friends and Family: More than three times a week   Frequency of Social Gatherings with Friends and Family: Once a week   Attends Religious Services: More than 4 times per year   Active Member of [employer] or Organizations: No   Attends Banker Meetings: Never   Marital Status: Widowed    Tobacco Counseling Counseling given: No Tobacco comments: Smoking cessation not required   Clinical Intake:  Pre-visit preparation completed: Yes  Pain : 0-10 Pain Score: 6  Pain Type: Chronic pain, Neuropathic pain Pain Location: Back Pain Orientation: Lower Pain Descriptors / Indicators: Aching, Constant, Burning Pain Onset: More than a month ago Pain Frequency: Constant  Nutritional Risks: None Diabetes: Yes CBG done?: No Did pt. bring in CBG monitor from home?: No  How often do you need to have someone help you when you read instructions, pamphlets, or other written materials from your doctor or pharmacy?: 1 - Never  Nutrition Risk Assessment:  Has the patient had any N/V/D within the last 2 months?  No  Does the patient have any non-healing wounds?  No  Has the patient had any unintentional weight loss or weight gain?  No   Diabetes:  Is the patient diabetic?  Yes  If diabetic, was a CBG obtained today?  No  Did the patient bring in their glucometer from home?  No  How often do you monitor your CBG's? Twice weekly.   Financial Strains and Diabetes Management:  Are you having any financial strains with the device, your supplies or your medication? No .  Does the patient want to be seen by Chronic Care Management for management of their diabetes?  No  Would the patient like to be referred to a Nutritionist or for Diabetic Management?  No   Diabetic Exams:  Diabetic Eye Exam: Completed 03/17/20 negative  retinopathy. Overdue for diabetic eye exam. Pt has been advised about the importance in completing this exam.   Diabetic Foot Exam: Completed 04/09/21.   Interpreter Needed?: No  Information entered by :: Reather Littler LPN   Activities of Daily Living In your present state of health, do you have any difficulty performing the following activities: 05/13/2021 04/09/2021  Hearing? N N  Vision? N N  Difficulty concentrating or making decisions? N N  Walking or climbing stairs? Y Y  Dressing or bathing? N N  Doing errands, shopping? Malvin Johns  Preparing Food and eating ? N -  Using the Toilet? N -  In the past six months, have you accidently leaked urine? Y -  Comment wears pads for protection -  Do you have problems with loss of bowel control? N -  Managing your Medications? N -  Managing your Finances? N -  Housekeeping or managing your Housekeeping? N -  Some recent data might be hidden    Patient Care Team: Reubin Milan, MD as PCP - General (Internal Medicine) Wanita Chamberlain, MD (Dermatology) Hildred Laser, MD as Referring Physician (Obstetrics and Gynecology) Estrella Deeds, MD as Referring Physician (Dermatology) Lamar Blinks, MD as Consulting Physician (Cardiology) Dayna Barker, MD as Referring Physician (Rheumatology)  Indicate any recent Medical Services you may have received from other than [facility] providers in the past year (date may be approximate).     Assessment:   This is a routine wellness examination for [place].  Hearing/Vision screen Hearing Screening - Comments:: Pt denies hearing difficulty  Vision Screening - Comments:: Annual vision screenings done at [facility]; due for exam  Dietary issues and exercise activities discussed: Current Exercise Habits: The patient does not participate in regular exercise at present, Exercise limited by: orthopedic condition(s);neurologic condition(s)   Goals Addressed             This Visit's  Progress    DIET - INCREASE WATER INTAKE   Not on track    Recommend drinking 6-8 glasses of water per day     Exercise 150 minutes per week (moderate activity)   Not on track    Recommended to attend [employer]       Depression Screen [facility] 2/9 Scores 05/13/2021 04/09/2021 01/27/2021 10/23/2020 04/23/2020 01/17/2020 01/02/2020  PHQ -  2 Score 0 0 0 2 0 0 0  PHQ- 9 Score 0 0 1 2 - 4 1    Fall Risk Fall Risk  05/13/2021 04/09/2021 01/27/2021 10/23/2020 10/23/2020  Falls in the past year? 0 0 0 0 0  Number falls in past yr: 0 0 - 0 0  Injury with Fall? 0 0 - 0 0  Risk for fall due to : No Fall Risks No Fall Risks - History of fall(s);Impaired balance/gait -  Follow up Falls prevention discussed Falls evaluation completed Falls evaluation completed Falls evaluation completed Falls evaluation completed    FALL RISK PREVENTION PERTAINING TO THE HOME:  Any stairs in or around the home? No  If so, are there any without handrails? No  Home free of loose throw rugs in walkways, pet beds, electrical cords, etc? Yes  Adequate lighting in your home to reduce risk of falls? Yes   ASSISTIVE DEVICES UTILIZED TO PREVENT FALLS:  Life alert? No  Use of a cane, walker or w/c? Yes  Grab bars in the bathroom? Yes  Shower chair or bench in shower? Yes  Elevated toilet seat or a handicapped toilet? Yes  TIMED UP AND GO:  Was the test performed? No . Telephonic visit.   Cognitive Function: Normal cognitive status assessed by direct observation by this Nurse Health Advisor. No abnormalities found.       6CIT Screen 04/23/2020 10/16/2018 06/16/2017  What Year? 0 points 0 points 0 points  What month? 0 points 0 points 0 points  What time? 0 points 0 points 0 points  Count back from 20 0 points 0 points 0 points  Months in reverse 0 points 0 points 0 points  Repeat phrase 0 points 0 points 4 points  Total Score 0 0 4    Immunizations Immunization History  Administered Date(s) Administered   Fluad  Quad(high Dose 65+) 05/24/2019   Influenza, High Dose Seasonal PF 05/01/2018, 06/03/2020, 05/08/2021   Influenza-Unspecified 05/04/2016, 05/24/2017, 05/04/2018   Moderna Sars-Covid-2 Vaccination 10/01/2019, 10/29/2019, 07/02/2020   Pneumococcal Conjugate-13 06/06/2014   Pneumococcal Polysaccharide-23 06/16/2017   Tdap 08/20/2011   Zoster Recombinat (Shingrix) 12/06/2019, 03/03/2020    TDAP status: Up to date  Flu Vaccine status: Up to date  Pneumococcal vaccine status: Up to date  Covid-19 vaccine status: Completed vaccines  Qualifies for Shingles Vaccine? Yes   Zostavax completed No   Shingrix Completed?: Yes  Screening Tests Health Maintenance  Topic Date Due   COVID-19 Vaccine (4 - Booster for Moderna series) 10/31/2020   OPHTHALMOLOGY EXAM  03/17/2021   MAMMOGRAM  06/02/2021   HEMOGLOBIN A1C  07/29/2021   TETANUS/TDAP  08/19/2021   URINE MICROALBUMIN  10/23/2021   FOOT EXAM  04/09/2022   INFLUENZA VACCINE  Completed   DEXA SCAN  Completed   Zoster Vaccines- Shingrix  Completed   HPV VACCINES  Aged Out    Health Maintenance  Health Maintenance Due  Topic Date Due   COVID-19 Vaccine (4 - Booster for Moderna series) 10/31/2020   OPHTHALMOLOGY EXAM  03/17/2021    Colorectal cancer screening: No longer required.   Mammogram status: Completed 06/02/20. Repeat every year  Bone Density status: Completed 06/02/20. Results reflect: Bone density results: OSTEOPOROSIS. Repeat every 2 years.  Lung Cancer Screening: (Low Dose CT Chest recommended if Age 84-80 years, 30 pack-year currently smoking OR have quit w/in 15years.) does not qualify.   Additional Screening:  Hepatitis C Screening: does not qualify  Vision Screening: Recommended annual  ophthalmology exams for early detection of glaucoma and other disorders of the eye. Is the patient up to date with their annual eye exam?  No  Who is the provider or what is the name of the office in which the patient attends  annual eye exams? [facility].   Dental Screening: Recommended annual dental exams for proper oral hygiene  Community Resource Referral / Chronic Care Management: CRR required this visit?  No   CCM required this visit?  No      Plan:     I have personally reviewed and noted the following in the patient's chart:   Medical and social history Use of alcohol, tobacco or illicit drugs  Current medications and supplements including opioid prescriptions.  Functional ability and status Nutritional status Physical activity Advanced directives List of other physicians Hospitalizations, surgeries, and ER visits in previous 12 months Vitals Screenings to include cognitive, depression, and falls Referrals and appointments  In addition, I have reviewed and discussed with patient certain preventive protocols, quality metrics, and best practice recommendations. A written personalized care plan for preventive services as well as general preventive health recommendations were provided to patient.     Reather Littler, LPN   [id number]   Nurse Notes: none

## 2021-05-13 NOTE — Patient Instructions (Signed)
Jordan Montoya , Thank you for taking time to come for your Medicare Wellness Visit. I appreciate your ongoing commitment to your health goals. Please review the following plan we discussed and let me know if I can assist you in the future.   Screening recommendations/referrals: Colonoscopy: no longer required Mammogram: done 06/02/20. Scheduled for 06/03/21 Bone Density: done 06/02/20 Recommended yearly ophthalmology/optometry visit for glaucoma screening and checkup Recommended yearly dental visit for hygiene and checkup  Vaccinations: Influenza vaccine: done 05/08/21 Pneumococcal vaccine: done 06/16/17 Tdap vaccine: done 08/20/11 Shingles vaccine: done 12/06/19 & 03/03/20   Covid-19:done 10/01/19, 10/29/19 & 07/02/20  Advanced directives: Please bring a copy of your health care power of attorney and living will to the office at your convenience.   Conditions/risks identified: Recommend drinking 6-8 glasses of water per day   Next appointment: Follow up in one year for your annual wellness visit    Preventive Care 65 Years and Older, Female Preventive care refers to lifestyle choices and visits with your health care provider that can promote health and wellness. What does preventive care include? A yearly physical exam. This is also called an annual well check. Dental exams once or twice a year. Routine eye exams. Ask your health care provider how often you should have your eyes checked. Personal lifestyle choices, including: Daily care of your teeth and gums. Regular physical activity. Eating a healthy diet. Avoiding tobacco and drug use. Limiting alcohol use. Practicing safe sex. Taking low-dose aspirin every day. Taking vitamin and mineral supplements as recommended by your health care provider. What happens during an annual well check? The services and screenings done by your health care provider during your annual well check will depend on your age, overall health, lifestyle risk  factors, and family history of disease. Counseling  Your health care provider may ask you questions about your: Alcohol use. Tobacco use. Drug use. Emotional well-being. Home and relationship well-being. Sexual activity. Eating habits. History of falls. Memory and ability to understand (cognition). Work and work Astronomer. Reproductive health. Screening  You may have the following tests or measurements: Height, weight, and BMI. Blood pressure. Lipid and cholesterol levels. These may be checked every 5 years, or more frequently if you are over 44 years old. Skin check. Lung cancer screening. You may have this screening every year starting at age 69 if you have a 30-pack-year history of smoking and currently smoke or have quit within the past 15 years. Fecal occult blood test (FOBT) of the stool. You may have this test every year starting at age 29. Flexible sigmoidoscopy or colonoscopy. You may have a sigmoidoscopy every 5 years or a colonoscopy every 10 years starting at age 11. Hepatitis C blood test. Hepatitis B blood test. Sexually transmitted disease (STD) testing. Diabetes screening. This is done by checking your blood sugar (glucose) after you have not eaten for a while (fasting). You may have this done every 1-3 years. Bone density scan. This is done to screen for osteoporosis. You may have this done starting at age 23. Mammogram. This may be done every 1-2 years. Talk to your health care provider about how often you should have regular mammograms. Talk with your health care provider about your test results, treatment options, and if necessary, the need for more tests. Vaccines  Your health care provider may recommend certain vaccines, such as: Influenza vaccine. This is recommended every year. Tetanus, diphtheria, and acellular pertussis (Tdap, Td) vaccine. You may need a Td booster every 10  years. Zoster vaccine. You may need this after age 4. Pneumococcal 13-valent  conjugate (PCV13) vaccine. One dose is recommended after age 64. Pneumococcal polysaccharide (PPSV23) vaccine. One dose is recommended after age 80. Talk to your health care provider about which screenings and vaccines you need and how often you need them. This information is not intended to replace advice given to you by your health care provider. Make sure you discuss any questions you have with your health care provider. Document Released: 08/15/2015 Document Revised: 04/07/2016 Document Reviewed: 05/20/2015 Elsevier Interactive Patient Education  2017 [place] Prevention in the Home Falls can cause injuries. They can happen to people of all ages. There are many things you can do to make your home safe and to help prevent falls. What can I do on the outside of my home? Regularly fix the edges of walkways and driveways and fix any cracks. Remove anything that might make you trip as you walk through a door, such as a raised step or threshold. Trim any bushes or trees on the path to your home. Use bright outdoor lighting. Clear any walking paths of anything that might make someone trip, such as rocks or tools. Regularly check to see if handrails are loose or broken. Make sure that both sides of any steps have handrails. Any raised decks and porches should have guardrails on the edges. Have any leaves, snow, or ice cleared regularly. Use sand or salt on walking paths during winter. Clean up any spills in your garage right away. This includes oil or grease spills. What can I do in the bathroom? Use night lights. Install grab bars by the toilet and in the tub and shower. Do not use towel bars as grab bars. Use non-skid mats or decals in the tub or shower. If you need to sit down in the shower, use a plastic, non-slip stool. Keep the floor dry. Clean up any water that spills on the floor as soon as it happens. Remove soap buildup in the tub or shower regularly. Attach bath mats  securely with double-sided non-slip rug tape. Do not have throw rugs and other things on the floor that can make you trip. What can I do in the bedroom? Use night lights. Make sure that you have a light by your bed that is easy to reach. Do not use any sheets or blankets that are too big for your bed. They should not hang down onto the floor. Have a firm chair that has side arms. You can use this for support while you get dressed. Do not have throw rugs and other things on the floor that can make you trip. What can I do in the kitchen? Clean up any spills right away. Avoid walking on wet floors. Keep items that you use a lot in easy-to-reach places. If you need to reach something above you, use a strong step stool that has a grab bar. Keep electrical cords out of the way. Do not use floor polish or wax that makes floors slippery. If you must use wax, use non-skid floor wax. Do not have throw rugs and other things on the floor that can make you trip. What can I do with my stairs? Do not leave any items on the stairs. Make sure that there are handrails on both sides of the stairs and use them. Fix handrails that are broken or loose. Make sure that handrails are as long as the stairways. Check any carpeting to make sure  that it is firmly attached to the stairs. Fix any carpet that is loose or worn. Avoid having throw rugs at the top or bottom of the stairs. If you do have throw rugs, attach them to the floor with carpet tape. Make sure that you have a light switch at the top of the stairs and the bottom of the stairs. If you do not have them, ask someone to add them for you. What else can I do to help prevent falls? Wear shoes that: Do not have high heels. Have rubber bottoms. Are comfortable and fit you well. Are closed at the toe. Do not wear sandals. If you use a stepladder: Make sure that it is fully opened. Do not climb a closed stepladder. Make sure that both sides of the stepladder  are locked into place. Ask someone to hold it for you, if possible. Clearly mark and make sure that you can see: Any grab bars or handrails. First and last steps. Where the edge of each step is. Use tools that help you move around (mobility aids) if they are needed. These include: Canes. Walkers. Scooters. Crutches. Turn on the lights when you go into a dark area. Replace any light bulbs as soon as they burn out. Set up your furniture so you have a clear path. Avoid moving your furniture around. If any of your floors are uneven, fix them. If there are any pets around you, be aware of where they are. Review your medicines with your doctor. Some medicines can make you feel dizzy. This can increase your chance of falling. Ask your doctor what other things that you can do to help prevent falls. This information is not intended to replace advice given to you by your health care provider. Make sure you discuss any questions you have with your health care provider. Document Released: 05/15/2009 Document Revised: 12/25/2015 Document Reviewed: 08/23/2014 Elsevier Interactive Patient Education  2017 [employer].

## 2021-05-14 ENCOUNTER — Other Ambulatory Visit: Payer: Self-pay

## 2021-05-14 ENCOUNTER — Ambulatory Visit: Payer: Medicare PPO | Admitting: Pulmonary Disease

## 2021-05-14 ENCOUNTER — Encounter: Payer: Self-pay | Admitting: Pulmonary Disease

## 2021-05-14 VITALS — BP 110/70 | HR 76 | Temp 97.1°F | Ht 66.0 in | Wt 169.2 lb

## 2021-05-14 DIAGNOSIS — R0989 Other specified symptoms and signs involving the circulatory and respiratory systems: Secondary | ICD-10-CM | POA: Diagnosis not present

## 2021-05-14 DIAGNOSIS — R5381 Other malaise: Secondary | ICD-10-CM

## 2021-05-14 DIAGNOSIS — R0609 Other forms of dyspnea: Secondary | ICD-10-CM | POA: Diagnosis not present

## 2021-05-14 NOTE — Progress Notes (Signed)
[facility]  Chief Complaint  Patient presents with   Consult    Abnormal chest x ray -    Past Surgical History:  She  has a past surgical history that includes Partial hysterectomy; Eye surgery (Bilateral); Breast cyst excision (Left); Breast biopsy (Left); and Abdominal hysterectomy.  Past Medical History:  Allergies, Anxiety, Vit D deficiency, HLD, Depression, Psoriasis, Shingles, DM type 2, Sepsis July 2020  Constitutional:  BP 110/70 (BP Location: Left Arm, Patient Position: Sitting, Cuff Size: Normal)   Pulse 76   Temp (!) 97.1 F (36.2 C) (Temporal)   Ht 5\' 6"  (1.676 m)   Wt 169 lb 3.2 oz (76.7 kg)   SpO2 97%   BMI 27.31 kg/m   Brief Summary:  Jordan Montoya is a 87 y.o. female former smoker with       Subjective:   She is here with her daughter.  She saw her PCP in the Summer.  She was concerned about feeling short of breath with activity.  She had chest xray 01/28/21 that showed hyperinflation with concern for emphysema.  She was started on trelegy.  She hasn't felt any improvement in her breathing with use of trelegy, but it makes her throat burn.  She quit smoking about 41 years ago.  She would smoke a few cigarettes in the evening after work.  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  No prior history of asthma.  She had pneumonia in 2020.  No history of TB or COVID.  Denies history of thromboembolic disease.  She feels her activity level has been limited since she had sepsis in 2020 and then had shingles.  She also has spinal stenosis and has to use a walker to get around.    She denies cough, wheeze, sputum chest pain, or hemoptysis.  She gets swelling in her feet that gets better when she keeps her feet up.  Labs from 01/27/21: WBC 6.5, Hb 12.4, PLT 262, Na 142, K 4.8, CO2 21, Creatinine 0.77, Ca 9.3, LFTs normal.  Physical Exam:   Appearance - well kempt   ENMT - no sinus tenderness, no oral exudate, no LAN, Mallampati 2  airway, no stridor  Respiratory - equal breath sounds bilaterally, no wheezing or rales  CV - s1s2 regular rate and rhythm, no murmurs  Ext - no clubbing, no edema  Skin - no rashes  Psych - normal mood and affect   Pulmonary testing:    Cardiac Tests:  Echo 03/19/21 >> EF greater than 55%, mild LVH, mild/mod AI, mod MR  Social History:  She  reports that she quit smoking about 41 years ago. Her smoking use included cigarettes. She has a 5.00 pack-year smoking history. She has never used smokeless tobacco. She reports that she does not drink alcohol and does not use drugs.  Family History:  Her family history includes Cancer in her mother; Diabetes in her son; Heart disease in her father.    Discussion:  She has minimal history of tobacco abuse and quit several decades ago.  Her chest xray shows changes suggestive of emphysema, but she would need to complete PFT to better assess for the presence of obstructive lung disease.  She hasn't noticed clinical benefit from use of trelegy and this has caused throat irritation.  She does have dyspnea on exertion, but this might be more related to limit activity level and deconditioning.  She also has valvular heart disease which is likely contributing to  her symptoms of dyspnea.  Assessment/Plan:   Hyperinflation on chest xray with concern for emphysema. - advised her to stop using trelegy for now - will arrange for pulmonary function test to better assess for presence of obstructive lung disease  Mitral regurgitation. - followed by Dr. Arnoldo Hooker with [facility]  Deconditioning. - if her pulmonary assessment is unrevealing, then advised her to try maintaining a regular exercise regimen  Time Spent Involved in Patient Care on Day of Examination:  47 minutes  Follow up:   Patient Instructions  Can stop using trelegy  Will arrange for pulmonary function test  Follow up in 3 months  Medication List:   Allergies  as of 05/14/2021       Reactions   Metformin Nausea Only   Macrobid [nitrofurantoin] Rash        Medication List        Accurate as of May 14, 2021 12:39 PM. If you have any questions, ask your nurse or doctor.          STOP taking these medications    Trelegy Ellipta 100-62.5-25 MCG/INH Aepb Generic drug: Fluticasone-Umeclidin-Vilant Stopped by: Coralyn Helling, MD       TAKE these medications    ALPRAZolam 0.25 MG tablet Commonly known as: XANAX TAKE 1 TABLET (0.25 MG TOTAL) BY MOUTH DAILY AS NEEDED. FOR ANXIETY   Cosentyx Sensoready (300 MG) 150 MG/ML Soaj Generic drug: Secukinumab (300 MG Dose)   CRANBERRY PO Take 2 tablets by mouth daily.   docusate sodium 100 MG capsule Commonly known as: COLACE Take 200 mg by mouth 2 (two) times daily as needed for mild constipation.   Januvia 100 MG tablet Generic drug: sitaGLIPtin TAKE 1 TABLET (100 MG TOTAL) BY MOUTH DAILY.   Myrbetriq 25 MG Tb24 tablet Generic drug: mirabegron ER TAKE 1 TABLET BY MOUTH EVERY DAY   naproxen sodium 220 MG tablet Commonly known as: ALEVE Take 220 mg by mouth.   nitrofurantoin 50 MG capsule Commonly known as: Macrodantin Take 1 capsule (50 mg total) by mouth daily.   pregabalin 100 MG capsule Commonly known as: LYRICA Take 1 capsule (100 mg total) by mouth 3 (three) times daily.   Premarin vaginal cream Generic drug: conjugated estrogens Apply 0.5mg  (pea-sized amount)  just inside the vaginal introitus with a finger-tip on  Monday, Wednesday and Friday nights.   PROBIOTIC-10 PO Take by mouth.   traMADol-acetaminophen 37.5-325 MG tablet Commonly known as: ULTRACET Take 1 tablet by mouth 2 (two) times daily as needed.   TYLENOL ARTHRITIS EXT RELIEF PO Take by mouth daily as needed.        Signature:  Coralyn Helling, MD [facility] Pulmonary/Critical Care Pager - [phone] 05/14/2021, 12:39 PM

## 2021-05-14 NOTE — Patient Instructions (Signed)
Can stop using trelegy  Will arrange for pulmonary function test  Follow up in 3 months

## 2021-05-19 DIAGNOSIS — Z7689 Persons encountering health services in other specified circumstances: Secondary | ICD-10-CM | POA: Diagnosis not present

## 2021-05-19 DIAGNOSIS — Z79899 Other long term (current) drug therapy: Secondary | ICD-10-CM | POA: Diagnosis not present

## 2021-05-19 DIAGNOSIS — L400 Psoriasis vulgaris: Secondary | ICD-10-CM | POA: Diagnosis not present

## 2021-05-19 DIAGNOSIS — H61001 Unspecified perichondritis of right external ear: Secondary | ICD-10-CM | POA: Diagnosis not present

## 2021-05-26 DIAGNOSIS — Z7689 Persons encountering health services in other specified circumstances: Secondary | ICD-10-CM | POA: Diagnosis not present

## 2021-06-01 ENCOUNTER — Telehealth: Payer: Self-pay

## 2021-06-01 NOTE — Telephone Encounter (Signed)
Called and spoke to patient about upcoming COVID test. Patient had a clear understanding. Nothing further needed.  

## 2021-06-03 ENCOUNTER — Other Ambulatory Visit
Admission: RE | Admit: 2021-06-03 | Discharge: 2021-06-03 | Disposition: A | Discharge status: Home / Self Care | Payer: Medicare PPO | Source: Ambulatory Visit | Attending: Pulmonary Disease | Admitting: Pulmonary Disease

## 2021-06-03 ENCOUNTER — Ambulatory Visit
Admission: RE | Admit: 2021-06-03 | Discharge: 2021-06-03 | Disposition: A | Discharge status: Home / Self Care | Payer: Medicare PPO | Source: Ambulatory Visit | Attending: Internal Medicine | Admitting: Internal Medicine

## 2021-06-03 ENCOUNTER — Other Ambulatory Visit: Payer: Self-pay

## 2021-06-03 DIAGNOSIS — Z20822 Contact with and (suspected) exposure to covid-19: Secondary | ICD-10-CM | POA: Insufficient documentation

## 2021-06-03 DIAGNOSIS — Z1231 Encounter for screening mammogram for malignant neoplasm of breast: Secondary | ICD-10-CM | POA: Diagnosis not present

## 2021-06-04 ENCOUNTER — Ambulatory Visit: Payer: Medicare PPO | Attending: Pulmonary Disease

## 2021-06-04 DIAGNOSIS — J984 Other disorders of lung: Secondary | ICD-10-CM | POA: Insufficient documentation

## 2021-06-04 DIAGNOSIS — R0609 Other forms of dyspnea: Secondary | ICD-10-CM | POA: Diagnosis not present

## 2021-06-04 LAB — SARS CORONAVIRUS 2 (TAT 6-24 HRS): SARS Coronavirus 2: NEGATIVE

## 2021-06-04 MED ORDER — ALBUTEROL SULFATE (2.5 MG/3ML) 0.083% IN NEBU
2.5000 mg | INHALATION_SOLUTION | Freq: Once | RESPIRATORY_TRACT | Status: AC
  Administered 2021-06-04: 2.5 mg via RESPIRATORY_TRACT
  Filled 2021-06-04: qty 3

## 2021-06-05 ENCOUNTER — Other Ambulatory Visit: Payer: Self-pay | Admitting: Internal Medicine

## 2021-06-05 NOTE — Telephone Encounter (Signed)
Requested Prescriptions  Pending Prescriptions Disp Refills  . JANUVIA 100 MG tablet [Pharmacy Med Name: JANUVIA 100 MG TABLET] 90 tablet 1    Sig: TAKE 1 TABLET (100 MG TOTAL) BY MOUTH DAILY.     Endocrinology:  Diabetes - DPP-4 Inhibitors Passed - 06/05/2021  1:48 AM      Passed - HBA1C is between 0 and 7.9 and within 180 days    Hgb A1c MFr Bld  Date Value Ref Range Status  01/27/2021 6.6 (H) 4.8 - 5.6 % Final    Comment:             Prediabetes: 5.7 - 6.4          Diabetes: >6.4          Glycemic control for adults with diabetes: <7.0          Passed - Cr in normal range and within 360 days    Creatinine  Date Value Ref Range Status  04/29/2014 0.91 0.60 - 1.30 mg/dL Final   Creatinine, Ser  Date Value Ref Range Status  01/27/2021 0.77 0.57 - 1.00 mg/dL Final         Passed - Valid encounter within last 6 months    Recent Outpatient Visits          1 month ago Annual physical exam   [facility] Reubin Milan, MD   4 months ago PSVT (paroxysmal supraventricular tachycardia) [facility])   [facility] Reubin Milan, MD   7 months ago Post herpetic neuralgia   [facility] Reubin Milan, MD   1 year ago Annual physical exam   [facility] Reubin Milan, MD   1 year ago Acute cystitis without hematuria   [facility] Reubin Milan, MD      Future Appointments            In 2 months Judithann Graves Nyoka Cowden, MD [facility], [facility]

## 2021-06-29 IMAGING — XA Imaging study
2 series · 2 of 2 positions shown · non-contrast
Comparison: none

CLINICAL DATA: Spondylosis without myelopathy. Severe spinal
stenosis L4-5 and L5-S1. Bilateral leg pain and weakness. Patient
does not described much back pain. For these reasons, caudal
approach was chosen. I did not think that I could safely approach
L4-5 or L5-S1. Because of the absence of lumbar region back pain, I
favor the inferior approach rather than L3-4 approach.

[Series 1: ortho standard · 1 of 1 slices shown (1 of 2)]
[im 1/1]
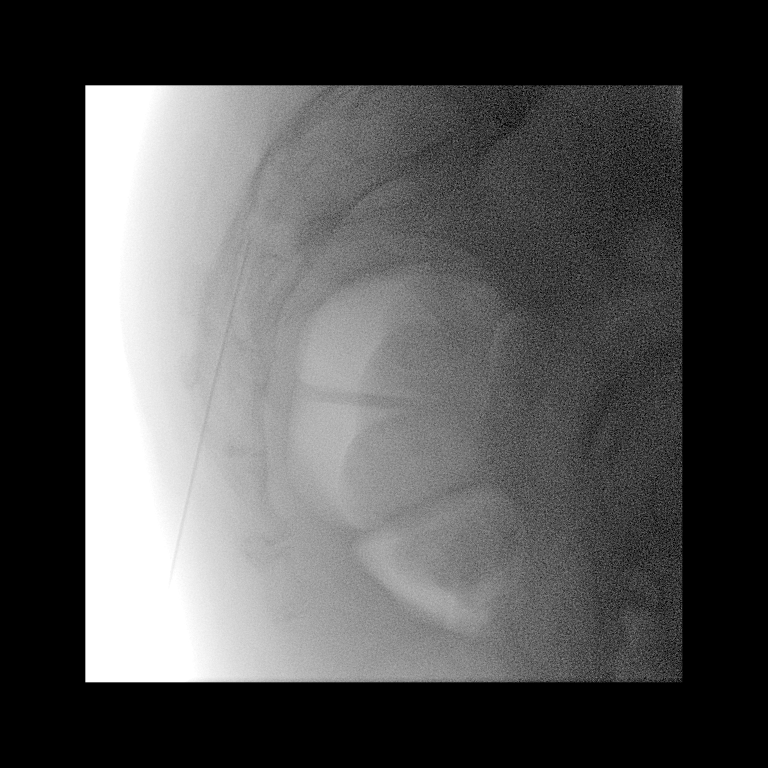

[Series 2: ortho standard · 1 of 1 slices shown (2 of 2)]
[im 1/1]
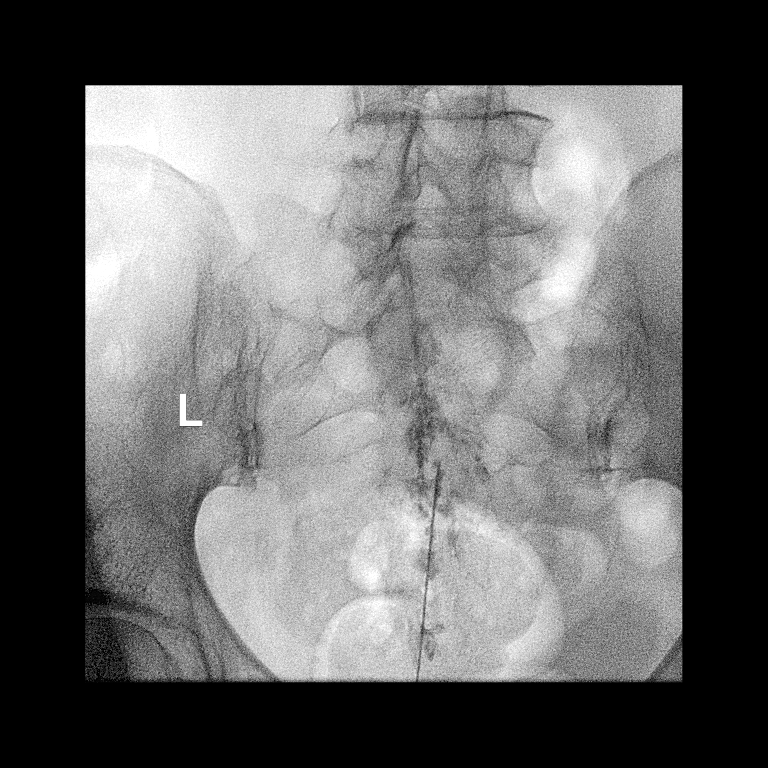

[2 of 2 positions shown; findings below may reference images not displayed]

FLUOROSCOPY TIME:  0 minutes 17 seconds. 34.18 micro gray meter
squared

EXAM:
CAUDAL EPIDURAL INJECTION

Utilizing a caudal approach, the skin overlying the sacral hiatus
was cleansed and anesthetized. A 20 gauge epidural needle was
advanced into the sacral epidural space. Injection of Isovue-M 200
shows a good epidural pattern with spread up to L5-S1. No vascular
opacification is seen.

120 mg of Depo-Medrol mixed with 3 ml of normal saline and 3 ml of
1% Lidocaine were instilled. The procedure was well-tolerated, and
the patient was discharged thirty minutes following the injection in
good condition.
IMPRESSION: Technically successful caudal epidural injection #1.

## 2021-07-06 ENCOUNTER — Other Ambulatory Visit: Payer: Self-pay | Admitting: Internal Medicine

## 2021-07-06 DIAGNOSIS — F411 Generalized anxiety disorder: Secondary | ICD-10-CM

## 2021-07-06 NOTE — Telephone Encounter (Signed)
Requested medications are due for refill today.  yes  Requested medications are on the active medications list.  yes  Last refill. 05/06/2021  Future visit scheduled.   yes  Notes to clinic.  Medication not delegated.

## 2021-08-10 ENCOUNTER — Other Ambulatory Visit: Payer: Self-pay

## 2021-08-10 ENCOUNTER — Ambulatory Visit: Payer: Medicare PPO | Admitting: Internal Medicine

## 2021-08-10 ENCOUNTER — Encounter: Payer: Self-pay | Admitting: Internal Medicine

## 2021-08-10 VITALS — BP 128/64 | HR 76 | Ht 66.0 in | Wt 167.8 lb

## 2021-08-10 DIAGNOSIS — E785 Hyperlipidemia, unspecified: Secondary | ICD-10-CM | POA: Diagnosis not present

## 2021-08-10 DIAGNOSIS — M48061 Spinal stenosis, lumbar region without neurogenic claudication: Secondary | ICD-10-CM

## 2021-08-10 DIAGNOSIS — I471 Supraventricular tachycardia: Secondary | ICD-10-CM

## 2021-08-10 DIAGNOSIS — B0229 Other postherpetic nervous system involvement: Secondary | ICD-10-CM

## 2021-08-10 DIAGNOSIS — E1169 Type 2 diabetes mellitus with other specified complication: Secondary | ICD-10-CM | POA: Diagnosis not present

## 2021-08-10 DIAGNOSIS — E118 Type 2 diabetes mellitus with unspecified complications: Secondary | ICD-10-CM

## 2021-08-10 DIAGNOSIS — L4050 Arthropathic psoriasis, unspecified: Secondary | ICD-10-CM | POA: Insufficient documentation

## 2021-08-10 DIAGNOSIS — F411 Generalized anxiety disorder: Secondary | ICD-10-CM | POA: Diagnosis not present

## 2021-08-10 NOTE — Progress Notes (Signed)
Date:  08/10/2021   Name:  Jordan Montoya   DOB:  November 12, 1933   MRN:  [id number]  Her today with her son Jordan Montoya.  Chief Complaint: Diabetes, Hypertension, and Hyperlipidemia  Diabetes She presents for her follow-up diabetic visit. She has type 2 diabetes mellitus. Her disease course has been stable. Hypoglycemia symptoms include nervousness/anxiousness. Pertinent negatives for hypoglycemia include no dizziness or headaches. Pertinent negatives for diabetes include no chest pain and no fatigue. Current diabetic treatment includes oral agent (monotherapy) Celesta Gentile). An ACE inhibitor/angiotensin II receptor blocker is not being taken.  Back Pain This is a chronic problem. The pain is present in the lumbar spine. The pain radiates to the left thigh and right thigh. The pain is moderate. Pertinent negatives include no abdominal pain, bladder incontinence, bowel incontinence, chest pain, fever, headaches or perianal numbness.  PHN/PA - on Lyrica twice a day.  Tried three per day but no benefit.  Also takes Tylenol. She also has Tramadol to take for severe pain.  Her last Rx was #60 from 08/2019.   Lab Results  Component Value Date   NA 142 01/27/2021   K 4.8 01/27/2021   CO2 21 01/27/2021   GLUCOSE 98 01/27/2021   BUN 16 01/27/2021   CREATININE 0.77 01/27/2021   CALCIUM 9.3 01/27/2021   EGFR 76 01/27/2021   GFRNONAA 59 (L) 04/14/2020   Lab Results  Component Value Date   CHOL 215 (H) 04/02/2020   HDL 67 04/02/2020   LDLCALC 119 (H) 04/02/2020   TRIG 165 (H) 04/02/2020   CHOLHDL 3.2 04/02/2020   Lab Results  Component Value Date   TSH 3.010 01/27/2021   Lab Results  Component Value Date   HGBA1C 6.6 (H) 01/27/2021   Lab Results  Component Value Date   WBC 6.5 01/27/2021   HGB 12.4 01/27/2021   HCT 37.9 01/27/2021   MCV 89 01/27/2021   PLT 262 01/27/2021   Lab Results  Component Value Date   ALT 13 01/27/2021   AST 18 01/27/2021   ALKPHOS 78 01/27/2021   BILITOT  0.3 01/27/2021   Lab Results  Component Value Date   VD25OH 19.0 (L) 02/02/2016     Review of Systems  Constitutional:  Negative for chills, fatigue, fever and unexpected weight change.  HENT:  Negative for trouble swallowing.   Respiratory:  Negative for cough, chest tightness and shortness of breath.   Cardiovascular:  Negative for chest pain and leg swelling.  Gastrointestinal:  Negative for abdominal pain and bowel incontinence.  Genitourinary:  Negative for bladder incontinence.  Musculoskeletal:  Positive for arthralgias, back pain, gait problem (uses rolator) and myalgias.  Neurological:  Negative for dizziness, light-headedness and headaches.  Psychiatric/Behavioral:  Negative for dysphoric mood and sleep disturbance. The patient is nervous/anxious.    Patient Active Problem List   Diagnosis Date Noted   Benign essential hypertension 04/09/2021   PSVT (paroxysmal supraventricular tachycardia) ([place]) 01/27/2021   Chronic pain of right knee 10/23/2020   Osteoporosis 06/04/2020   Chronic pain syndrome 06/11/2019   Post herpetic neuralgia 03/20/2019   Spinal stenosis of lumbar region at multiple levels 03/16/2019   Anxiety disorder 11/16/2018   Primary osteoarthritis of both hands 08/16/2018   Arthralgia of multiple sites 05/18/2018   Pernicious anemia 12/29/2017   Type II diabetes mellitus with complication ([place]) 07/62/2633   Environmental and seasonal allergies 05/30/2015   Hip pain, chronic 01/27/2015   Avitaminosis D 11/25/2014   Psoriasis 11/25/2014  Hyperlipidemia associated with type 2 diabetes mellitus ([place]) 08/20/2011    Allergies  Allergen Reactions   Metformin Nausea Only   Macrobid [Nitrofurantoin] Rash    Past Surgical History:  Procedure Laterality Date   ABDOMINAL HYSTERECTOMY     BREAST BIOPSY Left    neg-bx/clip   BREAST CYST EXCISION Left    neg   EYE SURGERY Bilateral    cataracts   PARTIAL HYSTERECTOMY     one ovary remains    Social  History   Tobacco Use   Smoking status: Former    Packs/day: 0.25    Years: 20.00    Pack years: 5.00    Types: Cigarettes    Quit date: 10/10/1979    Years since quitting: 41.8   Smokeless tobacco: Never   Tobacco comments:    Smoking cessation not required  Vaping Use   Vaping Use: Never used  Substance Use Topics   Alcohol use: No    Alcohol/week: 0.0 standard drinks   Drug use: No     Medication list has been reviewed and updated.  Current Meds  Medication Sig   Acetaminophen (TYLENOL ARTHRITIS EXT RELIEF PO) Take by mouth daily as needed.   ALPRAZolam (XANAX) 0.25 MG tablet TAKE 1 TABLET (0.25 MG TOTAL) BY MOUTH DAILY AS NEEDED. FOR ANXIETY   conjugated estrogens (PREMARIN) vaginal cream Apply 0.26m (pea-sized amount)  just inside the vaginal introitus with a finger-tip on  Monday, Wednesday and Friday nights.   COSENTYX SENSOREADY, 300 MG, 150 MG/ML SOAJ    docusate sodium (COLACE) 100 MG capsule Take 200 mg by mouth 2 (two) times daily as needed for mild constipation.   JANUVIA 100 MG tablet TAKE 1 TABLET (100 MG TOTAL) BY MOUTH DAILY.   MYRBETRIQ 25 MG TB24 tablet TAKE 1 TABLET BY MOUTH EVERY DAY   naproxen sodium (ALEVE) 220 MG tablet Take 220 mg by mouth.   pregabalin (LYRICA) 100 MG capsule Take 1 capsule (100 mg total) by mouth 3 (three) times daily.   Probiotic Product (PROBIOTIC-10 PO) Take by mouth.   traMADol-acetaminophen (ULTRACET) 37.5-325 MG tablet Take 1 tablet by mouth 2 (two) times daily as needed.    PHQ 2/9 Scores 08/10/2021 05/13/2021 04/09/2021 01/27/2021  PHQ - 2 Score 0 0 0 0  PHQ- 9 Score 1 0 0 1    GAD 7 : Generalized Anxiety Score 08/10/2021 04/09/2021 01/27/2021 10/23/2020  Nervous, Anxious, on Edge 0 0 0 0  Control/stop worrying 0 0 1 0  Worry too much - different things 0 0 1 0  Trouble relaxing 0 0 0 0  Restless 0 0 0 0  Easily annoyed or irritable 0 0 0 0  Afraid - awful might happen 0 0 0 0  Total GAD 7 Score 0 0 2 0  Anxiety Difficulty  Not difficult at all - - Not difficult at all    BP Readings from Last 3 Encounters:  08/10/21 128/64  05/14/21 110/70  04/09/21 140/82    Physical Exam Vitals and nursing note reviewed.  Constitutional:      General: She is not in acute distress.    Appearance: Normal appearance. She is well-developed.  HENT:     Head: Normocephalic and atraumatic.  Neck:     Vascular: No carotid bruit.  Cardiovascular:     Rate and Rhythm: Normal rate and regular rhythm.     Pulses: Normal pulses.     Heart sounds: No murmur heard. Pulmonary:  Effort: Pulmonary effort is normal. No respiratory distress.     Breath sounds: No wheezing or rhonchi.  Musculoskeletal:     Cervical back: Decreased range of motion.     Lumbar back: Spasms, tenderness and bony tenderness present.     Right lower leg: No edema.     Left lower leg: No edema.  Lymphadenopathy:     Cervical: No cervical adenopathy.  Skin:    General: Skin is warm and dry.     Capillary Refill: Capillary refill takes less than 2 seconds.     Findings: No rash.  Neurological:     Mental Status: She is alert and oriented to person, place, and time.  Psychiatric:        Mood and Affect: Mood normal.        Behavior: Behavior normal.    Wt Readings from Last 3 Encounters:  08/10/21 167 lb 12.8 oz (76.1 kg)  05/14/21 169 lb 3.2 oz (76.7 kg)  04/09/21 168 lb (76.2 kg)    BP 128/64    Pulse 76    Ht _0  (1.676 m)    Wt 167 lb 12.8 oz (76.1 kg)    SpO2 96%    BMI 27.08 kg/m   Assessment and Plan: 1. Hyperlipidemia associated with type 2 diabetes mellitus ([place]) Not on statin due to age and preference. - Lipid panel  2. Type II diabetes mellitus with complication (HCC) Clinically stable by exam and report without s/s of hypoglycemia. DM complicated by hypertension and dyslipidemia. Tolerating medications well without side effects or other concerns. - Hemoglobin A1c - Microalbumin / creatinine urine ratio  3. Psoriatic  arthritis ([place]) On Cosentyx from Dermatology for psoriasis as well. Doing well from a dermatology standpoint.  Still  some hand and hip pains  4. Post herpetic neuralgia On Lyrica 100 mg bid. She tried 100 mg tid but it was too sedating.  5. Spinal stenosis of lumbar region at multiple levels S/p facet injections without benefit. Pain management would like to do an MRI but she would need anesthesia. Recommend that she return to the specialist for further evaluation. She has tramadol from 2 years ago still.  I would be agreeable to giving her a few to take for severe pain (#15/6 months)  6. PSVT (paroxysmal supraventricular tachycardia) (HCC) In SR today with normal rate.  7. Generalized anxiety disorder Continue to use Xanax PRN only. She will call here for a refill when needed. Last filled #30 on 07/07/21.   Partially dictated using Editor, commissioning. Any errors are unintentional.  [name], MD [facility]  08/10/2021

## 2021-08-11 LAB — HEMOGLOBIN A1C
Est. average glucose Bld gHb Est-mCnc: 137 mg/dL
Hgb A1c MFr Bld: 6.4 % — ABNORMAL HIGH (ref 4.8–5.6)

## 2021-08-11 LAB — LIPID PANEL
Chol/HDL Ratio: 2.8 ratio (ref 0.0–4.4)
Cholesterol, Total: 185 mg/dL (ref 100–199)
HDL: 65 mg/dL (ref >39)
LDL Chol Calc (NIH): 90 mg/dL (ref 0–99)
Triglycerides: 175 mg/dL — ABNORMAL HIGH (ref 0–149)
VLDL Cholesterol Cal: 30 mg/dL (ref 5–40)

## 2021-08-18 ENCOUNTER — Ambulatory Visit: Payer: Medicare PPO | Admitting: Pulmonary Disease

## 2021-08-18 ENCOUNTER — Other Ambulatory Visit: Payer: Self-pay

## 2021-08-18 ENCOUNTER — Encounter: Payer: Self-pay | Admitting: Pulmonary Disease

## 2021-08-18 VITALS — BP 120/68 | HR 98 | Temp 97.9°F | Ht 66.0 in | Wt 167.4 lb

## 2021-08-18 DIAGNOSIS — J310 Chronic rhinitis: Secondary | ICD-10-CM

## 2021-08-18 DIAGNOSIS — R0989 Other specified symptoms and signs involving the circulatory and respiratory systems: Secondary | ICD-10-CM

## 2021-08-18 MED ORDER — AZELASTINE HCL 0.15 % NA SOLN: 1.0000 | Freq: Two times a day (BID) | NASAL | 2 refills | Status: AC

## 2021-08-18 MED ORDER — FLUTICASONE PROPIONATE 50 MCG/ACT NA SUSP: 1.0000 | Freq: Every day | NASAL | 2 refills | Status: DC

## 2021-08-18 NOTE — Progress Notes (Signed)
Pulmonary, Critical Care, and Sleep Medicine  Chief Complaint  Patient presents with   Follow-up    Review PFT-- sob with exertion.     Past Surgical History:  She  has a past surgical history that includes Partial hysterectomy; Eye surgery (Bilateral); Breast cyst excision (Left); Breast biopsy (Left); and Abdominal hysterectomy.  Past Medical History:  Allergies, Anxiety, Vit D deficiency, HLD, Depression, Psoriasis, Shingles, DM type 2, Sepsis July 2020  Constitutional:  BP 120/68 (BP Location: Left Arm, Cuff Size: Normal)    Pulse 98    Temp 97.9 F (36.6 C) (Temporal)    Ht 5\' 6"  (1.676 m)    Wt 167 lb 6.4 oz (75.9 kg)    SpO2 96%    BMI 27.02 kg/m   Brief Summary:  Jordan Montoya is a 88 y.o. female former smoker with       Subjective:   She is here with her son.  Her PFT showed very mild obstruction and diffusion defect.    She doesn't feel like she has cough, wheeze, or chest congestion.  She would not want to try an inhaler again.  Her main issues is related to sinus congestion and drainage.  Physical Exam:   Appearance - well kempt   ENMT - no sinus tenderness, no oral exudate, no LAN, Mallampati 2 airway, no stridor  Respiratory - equal breath sounds bilaterally, no wheezing or rales  CV - s1s2 regular rate and rhythm, no murmurs  Ext - no clubbing, no edema  Skin - no rashes  Psych - normal mood and affect   Pulmonary testing:  PFT 06/04/21 >> FEV1 1.70 (87%), FEV1% 81, TLC 3.99 (74%), DLCO 75%, +BD  Cardiac Tests:  Echo 03/19/21 >> EF greater than 55%, mild LVH, mild/mod AI, mod MR  Social History:  She  reports that she quit smoking about 41 years ago. Her smoking use included cigarettes. She has a 5.00 pack-year smoking history. She has never used smokeless tobacco. She reports that she does not drink alcohol and does not use drugs.  Family History:  Her family history includes Cancer in her mother; Diabetes in her son;  Heart disease in her father.     Assessment/Plan:   Hyperinflation on chest xray. - PFT unremarkable - she would not want to try an inhaler again - monitor clinically  Chronic rhinitis. - will have her try nasal irrigation, flonase, and azelastine - she can f/u with her PCP  Mitral regurgitation. - followed by Dr. 03/21/21 with [facility]  Time Spent Involved in Patient Care on Day of Examination:  26 minutes  Follow up:   Patient Instructions  Use saline nasal rinse daily when you have more sinus congestion Use flonase 1 spray in each nostril daily when you have more sinus congestion Use astepro (azelastine ) 1 spray in each nostril twice per day when you have more sinus congestion  Call to schedule follow up if you develop more respiratory symptoms  Medication List:   Allergies as of 08/18/2021       Reactions   Metformin Nausea Only   Macrobid [nitrofurantoin] Rash        Medication List        Accurate as of August 18, 2021 11:47 AM. If you have any questions, ask your nurse or doctor.          ALPRAZolam 0.25 MG tablet Commonly known as: XANAX TAKE 1 TABLET (0.25 MG TOTAL) BY  MOUTH DAILY AS NEEDED. FOR ANXIETY   Azelastine HCl 0.15 % Soln Commonly known as: Astepro Place 1 spray into the nose 2 (two) times daily. Started by: Coralyn Helling, MD   Cosentyx Sensoready (300 MG) 150 MG/ML Soaj Generic drug: Secukinumab (300 MG Dose)   docusate sodium 100 MG capsule Commonly known as: COLACE Take 200 mg by mouth 2 (two) times daily as needed for mild constipation.   fluticasone 50 MCG/ACT nasal spray Commonly known as: FLONASE Place 1 spray into both nostrils daily. Started by: Coralyn Helling, MD   Januvia 100 MG tablet Generic drug: sitaGLIPtin TAKE 1 TABLET (100 MG TOTAL) BY MOUTH DAILY.   Myrbetriq 25 MG Tb24 tablet Generic drug: mirabegron ER TAKE 1 TABLET BY MOUTH EVERY DAY   naproxen sodium 220 MG tablet Commonly known  as: ALEVE Take 220 mg by mouth.   pregabalin 100 MG capsule Commonly known as: LYRICA Take 1 capsule (100 mg total) by mouth 3 (three) times daily.   Premarin vaginal cream Generic drug: conjugated estrogens Apply 0.5mg  (pea-sized amount)  just inside the vaginal introitus with a finger-tip on  Monday, Wednesday and Friday nights.   PROBIOTIC-10 PO Take by mouth.   traMADol-acetaminophen 37.5-325 MG tablet Commonly known as: ULTRACET Take 1 tablet by mouth 2 (two) times daily as needed.   TYLENOL ARTHRITIS EXT RELIEF PO Take by mouth daily as needed.        Signature:  Coralyn Helling, MD [facility] Pulmonary/Critical Care Pager - [phone] 08/18/2021, 11:47 AM

## 2021-08-18 NOTE — Patient Instructions (Signed)
Use saline nasal rinse daily when you have more sinus congestion Use flonase 1 spray in each nostril daily when you have more sinus congestion Use astepro (azelastine ) 1 spray in each nostril twice per day when you have more sinus congestion  Call to schedule follow up if you develop more respiratory symptoms

## 2021-08-19 ENCOUNTER — Telehealth: Payer: Self-pay | Admitting: Internal Medicine

## 2021-08-19 ENCOUNTER — Ambulatory Visit: Payer: Self-pay | Admitting: *Deleted

## 2021-08-19 NOTE — Telephone Encounter (Signed)
Summary: Patient experiencing hoarseness and cant figure out why and what to do   Patient called in asking to speak to DR Judithann Graves nurse states that she is very hoarse and need to know if there is anything she can take OTC. Say she is extremely hoarse since she went out in the on Monday say that she have no fever just the hoarseness and is concerned. Say please call her back at Ph# [phone]        Chief Complaint: hoarseness. Requesting advise or medication  Symptoms: raspy voice , mild cough  Frequency: since 08/17/21  Pertinent Negatives: Patient denies difficulty breathing, fever Disposition: [] ED /[] Urgent Care (no appt availability in office) / [] Appointment(In office/virtual)/ []  [facility] [place] [facility]/ [x] Home Care/ [] Refused Recommended Disposition /[] [place] [facility]/ []  Follow-up with PCP Additional Notes:  Please advise if appt needed. Patient reports she can not make it in to office. Reports her breathing is good and does not have to be seen by specialist any longer. Please advise       Reason for Disposition  Hoarseness lasts < 2 weeks  Answer Assessment - Initial Assessment Questions 1. DESCRIPTION: "Describe your voice." (e.g., coarse, raspy, weaker, airy, scratchy, deeper)     Deeper, raspy  2. SEVERITY: "How bad is it?"   - MILD: doesn't interfere with normal activities   - MODERATE: interferes with normal activities such as school or work   - SEVERE: only able to whisper.     Mild 3. ONSET: "When did the hoarseness begin?"     Monday 08/17/21 4. COUGH: "Is there a cough?" If Yes, ask: "How bad is it?"     Tickle in throat  5. FEVER: "Do you have a fever?" If Yes, ask: "What is your temperature, how was it measured, and when did it start?"     No  6. ALLERGIES: "Any allergy symptoms?" If Yes, ask: "What are they?"     Not sure  7. IRRITANTS: "Do you smoke?" "Have you been exposed to any irritating fumes?" (e.g., smoke)     No  8. CAUSE: "What do you  think is causing the hoarseness?"     Not sure  9. OTHER SYMPTOMS: "Do you have any other symptoms?" (e.g., breathing difficulty, fever, foreign body, lymph node swelling in neck, rash, sore throat, weight loss)     na 10. PREGNANCY: "Is there any chance you are pregnant?" "When was your last menstrual period?"       na  Protocols used: [facility]

## 2021-08-20 NOTE — Telephone Encounter (Signed)
Pt called to see if Dr. Judithann Graves called in something for her nasal issue/ she asked what Dr. Judithann Graves can do for her / please advise

## 2021-08-20 NOTE — Telephone Encounter (Signed)
Spoke to pt let her know that her pulmonologist Dr. Craige Cotta sent in 2 nasal sprays. Let pt know if she's sick and needed any other medications she would need to be seen. Told pt she cant do a VV. Offered to schedule VV for pt because she stated she thought see needed a Zpak and that she didn't feel well. Pt declined and stated she would need to talk to her son first about a appt. She stated she would call back to schedule an appt if she get worse.   KP

## 2021-08-22 ENCOUNTER — Ambulatory Visit
Admission: EM | Admit: 2021-08-22 | Discharge: 2021-08-22 | Disposition: A | Discharge status: Home / Self Care | Payer: Medicare PPO | Attending: Emergency Medicine | Admitting: Emergency Medicine

## 2021-08-22 ENCOUNTER — Other Ambulatory Visit: Payer: Self-pay

## 2021-08-22 DIAGNOSIS — J069 Acute upper respiratory infection, unspecified: Secondary | ICD-10-CM | POA: Diagnosis not present

## 2021-08-22 MED ORDER — AMOXICILLIN-POT CLAVULANATE 875-125 MG PO TABS: 1.0000 | ORAL_TABLET | Freq: Two times a day (BID) | ORAL | 0 refills | Status: DC

## 2021-08-22 NOTE — ED Provider Notes (Signed)
[facility]    CSN: [id number] Arrival date & time: 08/22/21  1110      History   Chief Complaint Chief Complaint  Patient presents with   Cough   Nasal Congestion    HPI Jordan Montoya is a 88 y.o. female.   Patient presents with nasal congestion for 1 week.  3 days ago symptoms worsened and she began to experience bilateral ear pain and pressure, sinus pressure, fevers and a productive cough.  Has attempted use of over-the-counter medications which have given no relief.  Tolerating food and liquids.  Home COVID test negative.  History of type 2 diabetes, anxiety, hyperlipidemia, allergies.  Denies chest pain or tightness, shortness of breath, wheezing.     Past Medical History:  Diagnosis Date   Allergy    Anxiety    Avitaminosis D 11/25/2014   Environmental and seasonal allergies 05/30/2015   HLD (hyperlipidemia) 08/20/2011   Overview:  LDL goal <100 given DM2    Major depressive disorder with single episode, in partial remission ([place]) 11/25/2014   Psoriasis 11/25/2014   followed by Dermatology    Shingles 11/2018   Type 2 diabetes mellitus with hyperosmolarity without coma, without long-term current use of insulin ([place]) 09/30/2015   Unspecified septicemia(038.9) ([place]) 02/28/2019    Patient Active Problem List   Diagnosis Date Noted   Psoriatic arthritis ([place]) 08/10/2021   Benign essential hypertension 04/09/2021   PSVT (paroxysmal supraventricular tachycardia) ([place]) 01/27/2021   Chronic pain of right knee 10/23/2020   Osteoporosis 06/04/2020   Chronic pain syndrome 06/11/2019   Post herpetic neuralgia 03/20/2019   Spinal stenosis of lumbar region at multiple levels 03/16/2019   Anxiety disorder 11/16/2018   Primary osteoarthritis of both hands 08/16/2018   Arthralgia of multiple sites 05/18/2018   Pernicious anemia 12/29/2017   Type II diabetes mellitus with complication ([place]) [id number]   Environmental and seasonal allergies 05/30/2015   Hip  pain, chronic 01/27/2015   Avitaminosis D 11/25/2014   Psoriasis 11/25/2014   Hyperlipidemia associated with type 2 diabetes mellitus ([place]) 08/20/2011    Past Surgical History:  Procedure Laterality Date   ABDOMINAL HYSTERECTOMY     BREAST BIOPSY Left    neg-bx/clip   BREAST CYST EXCISION Left    neg   EYE SURGERY Bilateral    cataracts   PARTIAL HYSTERECTOMY     one ovary remains    OB History     Gravida  3   Para  3   Term  3   Preterm      AB      Living         SAB      IAB      Ectopic      Multiple      Live Births  1            Home Medications    Prior to Admission medications   Medication Sig Start Date End Date Taking? Authorizing Provider  Acetaminophen (TYLENOL ARTHRITIS EXT RELIEF PO) Take by mouth daily as needed.   Yes [provider]  ALPRAZolam (XANAX) 0.25 MG tablet TAKE 1 TABLET (0.25 MG TOTAL) BY MOUTH DAILY AS NEEDED. FOR ANXIETY 07/07/21  Yes Glean Hess, MD  Azelastine HCl (ASTEPRO) 0.15 % SOLN Place 1 spray into the nose 2 (two) times daily. 08/18/21  Yes Chesley Mires, MD  conjugated estrogens (PREMARIN) vaginal cream Apply 0.5mg  (pea-sized amount)  just inside the vaginal introitus with  a finger-tip on  Monday, Wednesday and Friday nights. 01/19/21  Yes McGowan, Shannon A, PA-C  COSENTYX SENSOREADY, 300 MG, 150 MG/ML SOAJ  12/20/19  Yes [provider]  docusate sodium (COLACE) 100 MG capsule Take 200 mg by mouth 2 (two) times daily as needed for mild constipation.   Yes [provider]  fluticasone (FLONASE) 50 MCG/ACT nasal spray Place 1 spray into both nostrils daily. 08/18/21  Yes Sood, Elisabeth Cara, MD  JANUVIA 100 MG tablet TAKE 1 TABLET (100 MG TOTAL) BY MOUTH DAILY. 06/05/21  Yes Glean Hess, MD  MYRBETRIQ 25 MG TB24 tablet TAKE 1 TABLET BY MOUTH EVERY DAY 03/09/21  Yes Billey Co, MD  naproxen sodium (ALEVE) 220 MG tablet Take 220 mg by mouth.   Yes [provider]  pregabalin  (LYRICA) 100 MG capsule Take 1 capsule (100 mg total) by mouth 3 (three) times daily. 04/09/21  Yes Glean Hess, MD  Probiotic Product (PROBIOTIC-10 PO) Take by mouth.   Yes [provider]  traMADol-acetaminophen (ULTRACET) 37.5-325 MG tablet Take 1 tablet by mouth 2 (two) times daily as needed. 09/05/19  Yes [provider]    Family History Family History  Problem Relation Age of Onset   Cancer Mother    Heart disease Father    Diabetes Son    Breast cancer Neg Hx     Social History Social History   Tobacco Use   Smoking status: Former    Packs/day: 0.25    Years: 20.00    Pack years: 5.00    Types: Cigarettes    Quit date: 10/10/1979    Years since quitting: 41.8   Smokeless tobacco: Never   Tobacco comments:    Smoking cessation not required  Vaping Use   Vaping Use: Never used  Substance Use Topics   Alcohol use: No    Alcohol/week: 0.0 standard drinks   Drug use: No     Allergies   Metformin and Macrobid [nitrofurantoin]   Review of Systems Review of Systems  Constitutional:  Positive for fever. Negative for activity change, appetite change, chills, diaphoresis, fatigue and unexpected weight change.  HENT:  Positive for congestion, ear pain and sinus pressure. Negative for dental problem, drooling, ear discharge, facial swelling, hearing loss, mouth sores, nosebleeds, postnasal drip, rhinorrhea, sinus pain, sneezing, sore throat, tinnitus, trouble swallowing and voice change.   Respiratory:  Positive for cough. Negative for apnea, choking, chest tightness, shortness of breath, wheezing and stridor.   Cardiovascular: Negative.   Gastrointestinal: Negative.   Skin: Negative.   Neurological: Negative.     Physical Exam Triage Vital Signs ED Triage Vitals  Enc Vitals Group     BP 08/22/21 1122 138/74     Pulse Rate 08/22/21 1122 (!) 106     Resp 08/22/21 1122 18     Temp 08/22/21 1122 98.1 F (36.7 C)     Temp Source 08/22/21 1122  Oral     SpO2 08/22/21 1122 95 %     Weight 08/22/21 1124 167 lb (75.8 kg)     Height 08/22/21 1124 5\' 6"  (1.676 m)     Head Circumference --      Peak Flow --      Pain Score 08/22/21 1123 7     Pain Loc --      Pain Edu? --      Excl. in [place]? --    No data found.  Updated Vital Signs BP 138/74 (BP  Location: Left Arm)    Pulse (!) 106    Temp 98.1 F (36.7 C) (Oral)    Resp 18    Ht 5\' 6"  (1.676 m)    Wt 167 lb (75.8 kg)    SpO2 95%    BMI 26.95 kg/m   Visual Acuity Right Eye Distance:   Left Eye Distance:   Bilateral Distance:    Right Eye Near:   Left Eye Near:    Bilateral Near:     Physical Exam Constitutional:      Appearance: Normal appearance. She is normal weight.  HENT:     Head: Normocephalic.     Right Ear: Tympanic membrane, ear canal and external ear normal.     Left Ear: Tympanic membrane, ear canal and external ear normal.     Nose: Congestion and rhinorrhea present.     Mouth/Throat:     Mouth: Mucous membranes are moist.     Pharynx: Posterior oropharyngeal erythema present.  Eyes:     Extraocular Movements: Extraocular movements intact.  Cardiovascular:     Rate and Rhythm: Normal rate and regular rhythm.     Pulses: Normal pulses.     Heart sounds: Normal heart sounds.  Pulmonary:     Effort: Pulmonary effort is normal.     Breath sounds: Normal breath sounds.  Musculoskeletal:     Cervical back: Normal range of motion and neck supple.  Skin:    General: Skin is warm and dry.  Neurological:     Mental Status: She is alert and oriented to person, place, and time. Mental status is at baseline.  Psychiatric:        Mood and Affect: Mood normal.        Behavior: Behavior normal.     UC Treatments / Results  Labs (all labs ordered are listed, but only abnormal results are displayed) Labs Reviewed - No data to display  EKG   Radiology No results found.  Procedures Procedures (including critical care time)  Medications Ordered in  UC Medications - No data to display  Initial Impression / Assessment and Plan / UC Course  I have reviewed the triage vital signs and the nursing notes.  Pertinent labs & imaging results that were available during my care of the patient were reviewed by me and considered in my medical decision making (see chart for details).  Upper respiratory infection  Vital signs are stable, O2 saturation 95% on room air , lungs clear to auscultation, discussed with patient and son, will defer imaging and use of steroid course at this time, due to worsening symptoms will prescribe Augmentin 7-day course to trial, recommended use of Mucinex and Flonase to help with congestion as this is the most worrisome symptom today, may follow-up at urgent care as needed Final Clinical Impressions(s) / UC Diagnoses   Final diagnoses:  None   Discharge Instructions   None    ED Prescriptions   None    PDMP not reviewed this encounter.   Hans [place], NP 08/22/21 1442

## 2021-08-22 NOTE — Discharge Instructions (Addendum)
Your symptoms today are most likely being caused by a virus and should steadily improve in time it can take up to 7 to 10 days before you truly start to see a turnaround however things will get better  Take Augmentin twice a day for the next 7 days to clear any possible bacteria  Continue use of Flonase daily  May begin use of Mucinex DM twice a day to help with congestion and coughing    You can take Tylenol and/or Ibuprofen as needed for fever reduction and pain relief.   For cough: honey 1/2 to 1 teaspoon (you can dilute the honey in water or another fluid).  You can also use guaifenesin and dextromethorphan for cough. You can use a humidifier for chest congestion and cough.  If you don't have a humidifier, you can sit in the bathroom with the hot shower running.      For sore throat: try warm salt water gargles, cepacol lozenges, throat spray, warm tea or water with lemon/honey, popsicles or ice, or OTC cold relief medicine for throat discomfort.   For congestion: take a daily anti-histamine like Zyrtec, Claritin, and a oral decongestant, such as pseudoephedrine.  You can also use Flonase 1-2 sprays in each nostril daily.   It is important to stay hydrated: drink plenty of fluids (water, gatorade/powerade/pedialyte, juices, or teas) to keep your throat moisturized and help further relieve irritation/discomfort.    Please return urgent care for worsening signs of breathing

## 2021-08-22 NOTE — ED Triage Notes (Signed)
Pt c/o congestion, cough, sinus pressure x1week. Pt oxygen ran low on 08/21/21 and it went to 89. Pt oxygen is at 95 today. Pt declines any SOB.  Pt has shingles and has pain from the waist down.   Pt took a home covid test and it was negative.

## 2021-09-08 ENCOUNTER — Other Ambulatory Visit: Payer: Self-pay | Admitting: Urology

## 2021-09-08 ENCOUNTER — Telehealth: Payer: Self-pay | Admitting: Family Medicine

## 2021-09-08 DIAGNOSIS — N952 Postmenopausal atrophic vaginitis: Secondary | ICD-10-CM

## 2021-09-08 MED ORDER — ESTRADIOL 0.1 MG/GM VA CREA: TOPICAL_CREAM | VAGINAL | 12 refills | Status: DC

## 2021-09-08 NOTE — Telephone Encounter (Signed)
Patient left message on triage line stating the Premarin cream is now 80.00. She states she cannot afford this medication. She is requesting a different or generic medication.

## 2021-09-08 NOTE — Telephone Encounter (Signed)
have sent Estrace cream to the pharmacy.per shannon .  Notified patient as instructed, patient pleased. Discussed follow-up appointments, patient agrees

## 2021-09-24 NOTE — Progress Notes (Signed)
No order placed for micro/cr.  KP

## 2021-10-08 ENCOUNTER — Other Ambulatory Visit: Payer: Self-pay | Admitting: Internal Medicine

## 2021-10-08 DIAGNOSIS — F411 Generalized anxiety disorder: Secondary | ICD-10-CM

## 2021-10-08 NOTE — Telephone Encounter (Signed)
Requested medication (s) are due for refill today: Yes ? ?Requested medication (s) are on the active medication list: Yes ? ?Last refill:  Alprazolam 07/07/21  Ultracet  09/05/19 Historical provider ? ?Future visit scheduled: Yes ? ?Notes to clinic:  See request. ? ? ? ?Requested Prescriptions  ?Pending Prescriptions Disp Refills  ? ALPRAZolam (XANAX) 0.25 MG tablet 30 tablet 0  ?  Sig: Take 1 tablet (0.25 mg total) by mouth daily as needed. for anxiety  ?  ? Not Delegated - Psychiatry: Anxiolytics/Hypnotics 2 Failed - 10/08/2021 10:27 AM  ?  ?  Failed - This refill cannot be delegated  ?  ?  Failed - Urine Drug Screen completed in last 360 days  ?  ?  Passed - Patient is not pregnant  ?  ?  Passed - Valid encounter within last 6 months  ?  Recent Outpatient Visits   ? ?      ? 1 month ago Hyperlipidemia associated with type 2 diabetes mellitus (HCC)  ? [facility] Reubin Milan, MD  ? 6 months ago Annual physical exam  ? [facility] Reubin Milan, MD  ? 8 months ago PSVT (paroxysmal supraventricular tachycardia) (HCC)  ? [facility] Reubin Milan, MD  ? 11 months ago Post herpetic neuralgia  ? [facility] Reubin Milan, MD  ? 1 year ago Annual physical exam  ? [facility] Reubin Milan, MD  ? ?  ?  ?Future Appointments   ? ?        ? In 2 months Judithann Graves Nyoka Cowden, MD [facility]  ? ?  ? ?  ?  ?  ? traMADol-acetaminophen (ULTRACET) 37.5-325 MG tablet 30 tablet   ?  Sig: Take 1 tablet by mouth 2 (two) times daily as needed.  ?  ? Not Delegated - Analgesics:  Opioid Agonist Combinations - acetaminophen / tramadol Failed - 10/08/2021 10:27 AM  ?  ?  Failed - This refill cannot be delegated  ?  ?  Failed - Urine Drug Screen completed in last 360 days  ?  ?  Passed - Cr in normal range and within 360 days  ?  Creatinine  ?Date Value Ref Range Status  ?04/29/2014 0.91 0.60 - 1.30 mg/dL Final  ? ?Creatinine, Ser  ?Date Value Ref Range Status   ?01/27/2021 0.77 0.57 - 1.00 mg/dL Final  ?  ?  ?  ?  Passed - AST in normal range and within 360 days  ?  AST  ?Date Value Ref Range Status  ?01/27/2021 18 0 - 40 IU/L Final  ?  ?  ?  ?  Passed - ALT in normal range and within 360 days  ?  ALT  ?Date Value Ref Range Status  ?01/27/2021 13 0 - 32 IU/L Final  ?  ?  ?  ?  Passed - Valid encounter within last 3 months  ?  Recent Outpatient Visits   ? ?      ? 1 month ago Hyperlipidemia associated with type 2 diabetes mellitus (HCC)  ? [facility] Reubin Milan, MD  ? 6 months ago Annual physical exam  ? [facility] Reubin Milan, MD  ? 8 months ago PSVT (paroxysmal supraventricular tachycardia) (HCC)  ? [facility] Reubin Milan, MD  ? 11 months ago Post herpetic neuralgia  ? [facility] Reubin Milan, MD  ?  1 year ago Annual physical exam  ? [facility] Reubin Milan, MD  ? ?  ?  ?Future Appointments   ? ?        ? In 2 months Judithann Graves Nyoka Cowden, MD [facility]  ? ?  ? ?  ?  ?  ? ?

## 2021-10-08 NOTE — Telephone Encounter (Signed)
Please review. Last office visit 08/10/2021. ? ?KP

## 2021-10-08 NOTE — Telephone Encounter (Signed)
Copied from CRM [id number]. Topic: Quick Communication - Rx Refill/Question ?>> Oct 08, 2021  8:27 AM Jaquita Rector A wrote: ?Medication: traMADol-acetaminophen (ULTRACET) 37.5-325 MG tablet, ALPRAZolam (XANAX) 0.25 MG tablet ? ?Has the patient contacted their pharmacy? No. Dr say she would send Rx but did not  ?(Agent: If no, request that the patient contact the pharmacy for the refill. If patient does not wish to contact the pharmacy document the reason why and proceed with request.) ?(Agent: If yes, when and what did the pharmacy advise?) ? ?Preferred Pharmacy (with phone number or street name): [id number] - [place], [place] - [address]  ?Phone:  [phone] ?Fax:  [phone] ? ? ? ?Has the patient been seen for an appointment in the last year OR does the patient have an upcoming appointment? Yes.   ? ?Agent: Please be advised that RX refills may take up to 3 business days. We ask that you follow-up with your pharmacy. ?

## 2021-10-09 ENCOUNTER — Other Ambulatory Visit: Payer: Self-pay | Admitting: Internal Medicine

## 2021-10-09 MED ORDER — ALPRAZOLAM 0.25 MG PO TABS: 0.2500 mg | ORAL_TABLET | Freq: Every day | ORAL | 0 refills | Status: DC | PRN

## 2021-10-12 ENCOUNTER — Other Ambulatory Visit: Payer: Self-pay | Admitting: Internal Medicine

## 2021-10-12 DIAGNOSIS — B0229 Other postherpetic nervous system involvement: Secondary | ICD-10-CM

## 2021-10-12 NOTE — Telephone Encounter (Signed)
Requested medication (s) are due for refill today - yes ? ?Requested medication (s) are on the active medication list -yes ? ?Future visit scheduled -yes ? ?Last refill: 04/09/21 #90 5 RF ? ?Notes to clinic: Request RF: non delegated Rx ? ?Requested Prescriptions  ?Pending Prescriptions Disp Refills  ? pregabalin (LYRICA) 100 MG capsule [Pharmacy Med Name: PREGABALIN 100 MG CAPSULE] 90 capsule   ?  Sig: TAKE 1 CAPSULE (100 MG TOTAL) BY MOUTH THREE TIMES DAILY.  ?  ? Not Delegated - Neurology:  Anticonvulsants - Controlled - pregabalin Failed - 10/12/2021  9:14 AM  ?  ?  Failed - This refill cannot be delegated  ?  ?  Passed - Cr in normal range and within 360 days  ?  Creatinine  ?Date Value Ref Range Status  ?04/29/2014 0.91 0.60 - 1.30 mg/dL Final  ? ?Creatinine, Ser  ?Date Value Ref Range Status  ?01/27/2021 0.77 0.57 - 1.00 mg/dL Final  ?  ?  ?  ?  Passed - Completed PHQ-2 or PHQ-9 in the last 360 days  ?  ?  Passed - Valid encounter within last 12 months  ?  Recent Outpatient Visits   ? ?      ? 2 months ago Hyperlipidemia associated with type 2 diabetes mellitus (HCC)  ? [facility] Reubin Milan, MD  ? 6 months ago Annual physical exam  ? [facility] Reubin Milan, MD  ? 8 months ago PSVT (paroxysmal supraventricular tachycardia) (HCC)  ? [facility] Reubin Milan, MD  ? 11 months ago Post herpetic neuralgia  ? [facility] Reubin Milan, MD  ? 1 year ago Annual physical exam  ? [facility] Reubin Milan, MD  ? ?  ?  ?Future Appointments   ? ?        ? In 1 month Judithann Graves Nyoka Cowden, MD [facility]  ? ?  ? ?  ?  ?  ? ? ? ?Requested Prescriptions  ?Pending Prescriptions Disp Refills  ? pregabalin (LYRICA) 100 MG capsule [Pharmacy Med Name: PREGABALIN 100 MG CAPSULE] 90 capsule   ?  Sig: TAKE 1 CAPSULE (100 MG TOTAL) BY MOUTH THREE TIMES DAILY.  ?  ? Not Delegated - Neurology:  Anticonvulsants - Controlled - pregabalin Failed -  10/12/2021  9:14 AM  ?  ?  Failed - This refill cannot be delegated  ?  ?  Passed - Cr in normal range and within 360 days  ?  Creatinine  ?Date Value Ref Range Status  ?04/29/2014 0.91 0.60 - 1.30 mg/dL Final  ? ?Creatinine, Ser  ?Date Value Ref Range Status  ?01/27/2021 0.77 0.57 - 1.00 mg/dL Final  ?  ?  ?  ?  Passed - Completed PHQ-2 or PHQ-9 in the last 360 days  ?  ?  Passed - Valid encounter within last 12 months  ?  Recent Outpatient Visits   ? ?      ? 2 months ago Hyperlipidemia associated with type 2 diabetes mellitus (HCC)  ? [facility] Reubin Milan, MD  ? 6 months ago Annual physical exam  ? [facility] Reubin Milan, MD  ? 8 months ago PSVT (paroxysmal supraventricular tachycardia) (HCC)  ? [facility] Reubin Milan, MD  ? 11 months ago Post herpetic neuralgia  ? [facility] Reubin Milan, MD  ? 1 year ago Annual  physical exam  ? [facility] Reubin Milan, MD  ? ?  ?  ?Future Appointments   ? ?        ? In 1 month Judithann Graves Nyoka Cowden, MD [facility]  ? ?  ? ?  ?  ?  ? ? ? ?

## 2021-10-13 ENCOUNTER — Other Ambulatory Visit: Payer: Self-pay | Admitting: Internal Medicine

## 2021-10-13 ENCOUNTER — Telehealth: Payer: Self-pay | Admitting: Internal Medicine

## 2021-10-13 DIAGNOSIS — B0229 Other postherpetic nervous system involvement: Secondary | ICD-10-CM

## 2021-10-13 NOTE — Telephone Encounter (Signed)
Medication refilled

## 2021-10-13 NOTE — Telephone Encounter (Signed)
Requested medications are due for refill today.  no ? ?Requested medications are on the active medications list.  yes ? ?Last refill. 10/12/2021 ? ?Future visit scheduled.   yes ? ?Notes to clinic.  Pharmacy needs Dx code. ? ? ? ?Requested Prescriptions  ?Pending Prescriptions Disp Refills  ? pregabalin (LYRICA) 100 MG capsule [Pharmacy Med Name: PREGABALIN 100 MG CAPSULE] 90 capsule   ?  Sig: TAKE 1 CAPSULE (100 MG TOTAL) BY MOUTH THREE TIMES DAILY.  ?  ? Not Delegated - Neurology:  Anticonvulsants - Controlled - pregabalin Failed - 10/13/2021  9:37 AM  ?  ?  Failed - This refill cannot be delegated  ?  ?  Passed - Cr in normal range and within 360 days  ?  Creatinine  ?Date Value Ref Range Status  ?04/29/2014 0.91 0.60 - 1.30 mg/dL Final  ? ?Creatinine, Ser  ?Date Value Ref Range Status  ?01/27/2021 0.77 0.57 - 1.00 mg/dL Final  ?  ?  ?  ?  Passed - Completed PHQ-2 or PHQ-9 in the last 360 days  ?  ?  Passed - Valid encounter within last 12 months  ?  Recent Outpatient Visits   ? ?      ? 2 months ago Hyperlipidemia associated with type 2 diabetes mellitus (HCC)  ? [facility] Reubin Milan, MD  ? 6 months ago Annual physical exam  ? [facility] Reubin Milan, MD  ? 8 months ago PSVT (paroxysmal supraventricular tachycardia) (HCC)  ? [facility] Reubin Milan, MD  ? 11 months ago Post herpetic neuralgia  ? [facility] Reubin Milan, MD  ? 1 year ago Annual physical exam  ? [facility] Reubin Milan, MD  ? ?  ?  ?Future Appointments   ? ?        ? In 1 month Judithann Graves Nyoka Cowden, MD [facility]  ? ?  ? ?  ?  ?  ?  ?

## 2021-10-13 NOTE — Telephone Encounter (Signed)
Copied from [place] [id number]. Topic: General - Other ?>> Oct 13, 2021  4:11 PM Tessa Lerner A wrote: ?Reason for CRM: The patient would like to speak with a member of staff when possible about the refill of their pregabalin (LYRICA) 100 MG capsule [id number] ? ?The patient has been in contact with their pharmacy and told that no refill has been submitted for them  ? ?The patient has stressed the urgency of their request  ? ?Please contact further when possible ?

## 2021-11-06 ENCOUNTER — Ambulatory Visit: Payer: Medicare PPO | Admitting: Internal Medicine

## 2021-11-09 ENCOUNTER — Encounter: Payer: Self-pay | Admitting: Internal Medicine

## 2021-11-09 ENCOUNTER — Ambulatory Visit: Payer: Medicare PPO | Admitting: Internal Medicine

## 2021-11-09 VITALS — BP 135/78 | HR 77 | Ht 66.0 in | Wt 166.0 lb

## 2021-11-09 DIAGNOSIS — T753XXA Motion sickness, initial encounter: Secondary | ICD-10-CM

## 2021-11-09 DIAGNOSIS — M48061 Spinal stenosis, lumbar region without neurogenic claudication: Secondary | ICD-10-CM

## 2021-11-09 DIAGNOSIS — F411 Generalized anxiety disorder: Secondary | ICD-10-CM

## 2021-11-09 MED ORDER — SCOPOLAMINE 1 MG/3DAYS TD PT72: 1.0000 | MEDICATED_PATCH | TRANSDERMAL | 0 refills | Status: DC

## 2021-11-09 MED ORDER — TRAMADOL-ACETAMINOPHEN 37.5-325 MG PO TABS: 0.5000 | ORAL_TABLET | Freq: Four times a day (QID) | ORAL | 2 refills | Status: DC | PRN

## 2021-11-09 MED ORDER — ALPRAZOLAM 0.25 MG PO TABS: 0.2500 mg | ORAL_TABLET | Freq: Every day | ORAL | 3 refills | Status: DC | PRN

## 2021-11-09 NOTE — Progress Notes (Signed)
? ? ?Date:  11/09/2021  ? ?Name:  Jordan Montoya   DOB:  02/16/1934   MRN:  [id number] ? ? ?Chief Complaint: Anxiety and motion sickness ? ?Anxiety ?Presents for follow-up visit. Symptoms include excessive worry and irritability. Patient reports no chest pain, dizziness, nervous/anxious behavior, shortness of breath or suicidal ideas. Symptoms occur occasionally. The severity of symptoms is moderate. The quality of sleep is fair. Nighttime awakenings: occasional.  ? ? ?Back Pain ?This is a chronic problem. The pain is present in the lumbar spine. The pain is moderate. The symptoms are aggravated by standing, twisting and bending. Pertinent negatives include no abdominal pain, chest pain, fever or headaches. She has tried analgesics for the symptoms. The treatment provided significant relief.  ? ?Motion sickness - she is going to her granddaughters wedding in May and is very prone to motion sickness.  She would like to try transderm scop patches. ? ?Lab Results  ?Component Value Date  ? NA 142 01/27/2021  ? K 4.8 01/27/2021  ? CO2 21 01/27/2021  ? GLUCOSE 98 01/27/2021  ? BUN 16 01/27/2021  ? CREATININE 0.77 01/27/2021  ? CALCIUM 9.3 01/27/2021  ? EGFR 76 01/27/2021  ? GFRNONAA 59 (L) 04/14/2020  ? ?Lab Results  ?Component Value Date  ? CHOL 185 08/10/2021  ? HDL 65 08/10/2021  ? [place] 90 08/10/2021  ? TRIG 175 (H) 08/10/2021  ? CHOLHDL 2.8 08/10/2021  ? ?Lab Results  ?Component Value Date  ? TSH 3.010 01/27/2021  ? ?Lab Results  ?Component Value Date  ? HGBA1C 6.4 (H) 08/10/2021  ? ?Lab Results  ?Component Value Date  ? WBC 6.5 01/27/2021  ? HGB 12.4 01/27/2021  ? HCT 37.9 01/27/2021  ? MCV 89 01/27/2021  ? PLT 262 01/27/2021  ? ?Lab Results  ?Component Value Date  ? ALT 13 01/27/2021  ? AST 18 01/27/2021  ? ALKPHOS 78 01/27/2021  ? BILITOT 0.3 01/27/2021  ? ?Lab Results  ?Component Value Date  ? VD25OH 19.0 (L) 02/02/2016  ?  ? ?Review of Systems  ?Constitutional:  Positive for irritability. Negative for  chills, fatigue and fever.  ?HENT:  Negative for trouble swallowing.   ?Eyes:  Negative for visual disturbance.  ?Respiratory:  Negative for shortness of breath.   ?Cardiovascular:  Negative for chest pain and leg swelling.  ?Gastrointestinal:  Negative for abdominal pain, constipation and diarrhea.  ?Musculoskeletal:  Positive for back pain.  ?Neurological:  Negative for dizziness and headaches.  ?     Motion sickness even riding in the car  ?Psychiatric/Behavioral:  Negative for dysphoric mood and suicidal ideas. The patient is not nervous/anxious.   ? ?Patient Active Problem List  ? Diagnosis Date Noted  ? Psoriatic arthritis ([place]) 08/10/2021  ? Benign essential hypertension 04/09/2021  ? PSVT (paroxysmal supraventricular tachycardia) ([place]) 01/27/2021  ? Chronic pain of right knee 10/23/2020  ? Osteoporosis 06/04/2020  ? Chronic pain syndrome 06/11/2019  ? Post herpetic neuralgia 03/20/2019  ? Spinal stenosis of lumbar region at multiple levels 03/16/2019  ? Anxiety disorder 11/16/2018  ? Primary osteoarthritis of both hands 08/16/2018  ? Arthralgia of multiple sites 05/18/2018  ? Pernicious anemia 12/29/2017  ? Type II diabetes mellitus with complication ([place]) 02/72/5366  ? Environmental and seasonal allergies 05/30/2015  ? Hip pain, chronic 01/27/2015  ? Avitaminosis D 11/25/2014  ? Psoriasis 11/25/2014  ? Hyperlipidemia associated with type 2 diabetes mellitus ([place]) 08/20/2011  ? ? ?Allergies  ?Allergen Reactions  ? Metformin Nausea  Only  ? Macrobid [Nitrofurantoin] Rash  ? ? ?Past Surgical History:  ?Procedure Laterality Date  ? ABDOMINAL HYSTERECTOMY    ? BREAST BIOPSY Left   ? neg-bx/clip  ? BREAST CYST EXCISION Left   ? neg  ? EYE SURGERY Bilateral   ? cataracts  ? PARTIAL HYSTERECTOMY    ? one ovary remains  ? ? ?Social History  ? ?Tobacco Use  ? Smoking status: Former  ?  Packs/day: 0.25  ?  Years: 20.00  ?  Pack years: 5.00  ?  Types: Cigarettes  ?  Quit date: 10/10/1979  ?  Years since quitting: 42.1  ?  Smokeless tobacco: Never  ? Tobacco comments:  ?  Smoking cessation not required  ?Vaping Use  ? Vaping Use: Never used  ?Substance Use Topics  ? Alcohol use: No  ?  Alcohol/week: 0.0 standard drinks  ? Drug use: No  ? ? ? ?Medication list has been reviewed and updated. ? ?Current Meds  ?Medication Sig  ? Acetaminophen (TYLENOL ARTHRITIS EXT RELIEF PO) Take by mouth daily as needed.  ? ALPRAZolam (XANAX) 0.25 MG tablet Take 1 tablet (0.25 mg total) by mouth daily as needed. for anxiety  ? Azelastine HCl (ASTEPRO) 0.15 % SOLN Place 1 spray into the nose 2 (two) times daily.  ? COSENTYX SENSOREADY, 300 MG, 150 MG/ML SOAJ   ? docusate sodium (COLACE) 100 MG capsule Take 200 mg by mouth 2 (two) times daily as needed for mild constipation.  ? estradiol (ESTRACE VAGINAL) 0.1 MG/GM vaginal cream Apply 0.53m (pea-sized amount)  just inside the vaginal introitus with a finger-tip on Monday, Wednesday and Friday nights.  ? fluticasone (FLONASE) 50 MCG/ACT nasal spray Place 1 spray into both nostrils daily.  ? JANUVIA 100 MG tablet TAKE 1 TABLET (100 MG TOTAL) BY MOUTH DAILY.  ? MYRBETRIQ 25 MG TB24 tablet TAKE 1 TABLET BY MOUTH EVERY DAY  ? naproxen sodium (ALEVE) 220 MG tablet Take 220 mg by mouth.  ? pregabalin (LYRICA) 100 MG capsule TAKE 1 CAPSULE (100 MG TOTAL) BY MOUTH THREE TIMES DAILY.  ? Probiotic Product (PROBIOTIC-10 PO) Take by mouth.  ? [DISCONTINUED] traMADol-acetaminophen (ULTRACET) 37.5-325 MG tablet Take 1 tablet by mouth 2 (two) times daily as needed.  ? ? ? ?  08/10/2021  ?  9:54 AM 04/09/2021  ?  9:48 AM 01/27/2021  ? 10:25 AM 10/23/2020  ? 10:57 AM  ?GAD 7 : Generalized Anxiety Score  ?Nervous, Anxious, on Edge 0 0 0 0  ?Control/stop worrying 0 0 1 0  ?Worry too much - different things 0 0 1 0  ?Trouble relaxing 0 0 0 0  ?Restless 0 0 0 0  ?Easily annoyed or irritable 0 0 0 0  ?Afraid - awful might happen 0 0 0 0  ?Total GAD 7 Score 0 0 2 0  ?Anxiety Difficulty Not difficult at all   Not difficult at all   ? ? ? ?  08/10/2021  ?  9:54 AM  ?Depression screen PHQ 2/9  ?Decreased Interest 0  ?Down, Depressed, Hopeless 0  ?PHQ - 2 Score 0  ?Altered sleeping 0  ?Tired, decreased energy 1  ?Change in appetite 0  ?Feeling bad or failure about yourself  0  ?Trouble concentrating 0  ?Moving slowly or fidgety/restless 0  ?Suicidal thoughts 0  ?PHQ-9 Score 1  ?Difficult doing work/chores Not difficult at all  ? ? ?BP Readings from Last 3 Encounters:  ?11/09/21 135/78  ?08/22/21  138/74  ?08/18/21 120/68  ? ? ?Physical Exam ?Vitals and nursing note reviewed.  ?Constitutional:   ?   General: She is not in acute distress. ?   Appearance: Normal appearance. She is well-developed.  ?HENT:  ?   Head: Normocephalic and atraumatic.  ?Cardiovascular:  ?   Rate and Rhythm: Normal rate and regular rhythm.  ?   Pulses: Normal pulses.  ?   Heart sounds: No murmur heard. ?Pulmonary:  ?   Effort: Pulmonary effort is normal. No respiratory distress.  ?   Breath sounds: No wheezing or rhonchi.  ?Musculoskeletal:  ?   Cervical back: Normal range of motion.  ?   Right lower leg: No edema.  ?   Left lower leg: No edema.  ?Lymphadenopathy:  ?   Cervical: No cervical adenopathy.  ?Skin: ?   General: Skin is warm and dry.  ?   Findings: No rash.  ?Neurological:  ?   General: No focal deficit present.  ?   Mental Status: She is alert and oriented to person, place, and time.  ?   Motor: Motor function is intact.  ?Psychiatric:     ?   Mood and Affect: Mood normal.     ?   Behavior: Behavior normal.  ? ? ?Wt Readings from Last 3 Encounters:  ?11/09/21 166 lb (75.3 kg)  ?08/22/21 167 lb (75.8 kg)  ?08/18/21 167 lb 6.4 oz (75.9 kg)  ? ? ?BP 135/78 (BP Location: Left Arm, Cuff Size: Large)   Pulse 77   Ht _0  (1.676 m)   Wt 166 lb (75.3 kg)   SpO2 97%   BMI 26.79 kg/m?  ? ?Assessment and Plan: ?1. Spinal stenosis of lumbar region at multiple levels ?Handicapped parking application given. ?Continue to use Ultracet only PRN severe pain ?-  traMADol-acetaminophen (ULTRACET) 37.5-325 MG tablet; Take 0.5-1 tablets by mouth every 6 (six) hours as needed.  Dispense: 30 tablet; Refill: 2 ? ?2. Generalized anxiety disorder ?Mild, unchanged. ?Will refill Xanax. ? ?3. [place]

## 2021-11-26 ENCOUNTER — Other Ambulatory Visit: Payer: Self-pay | Admitting: Internal Medicine

## 2021-11-26 DIAGNOSIS — B0229 Other postherpetic nervous system involvement: Secondary | ICD-10-CM

## 2021-11-26 NOTE — Telephone Encounter (Signed)
the patient called to check the status of refill request for Pregabalin/ she stated she has  4 pills left and doesn't want to run out/ please advise  ?

## 2021-11-27 ENCOUNTER — Other Ambulatory Visit: Payer: Self-pay | Admitting: Internal Medicine

## 2021-11-27 DIAGNOSIS — B0229 Other postherpetic nervous system involvement: Secondary | ICD-10-CM

## 2021-11-27 NOTE — Telephone Encounter (Signed)
Patient called in to inform Dr Judithann Graves that she need her medication so she wont run out and not have any for the weekend. Please send Rx today and call patient ?

## 2021-11-27 NOTE — Telephone Encounter (Signed)
Requested medication (s) are due for refill today: yes ? ?Requested medication (s) are on the active medication list: yes ? ?Last refill:  10/13/21 ? ?Future visit scheduled: yes ? ?Notes to clinic:  med not delegated to NT to RF ? ? ?Requested Prescriptions  ?Pending Prescriptions Disp Refills  ? pregabalin (LYRICA) 100 MG capsule [Pharmacy Med Name: PREGABALIN 100 MG CAPSULE] 90 capsule 0  ?  Sig: TAKE 1 CAPSULE (100 MG TOTAL) BY MOUTH 3 TIMES A DAY  ?  ? Not Delegated - Neurology:  Anticonvulsants - Controlled - pregabalin Failed - 11/26/2021  9:14 AM  ?  ?  Failed - This refill cannot be delegated  ?  ?  Passed - Cr in normal range and within 360 days  ?  Creatinine  ?Date Value Ref Range Status  ?04/29/2014 0.91 0.60 - 1.30 mg/dL Final  ? ?Creatinine, Ser  ?Date Value Ref Range Status  ?01/27/2021 0.77 0.57 - 1.00 mg/dL Final  ?  ?  ?  ?  Passed - Completed PHQ-2 or PHQ-9 in the last 360 days  ?  ?  Passed - Valid encounter within last 12 months  ?  Recent Outpatient Visits   ? ?      ? 2 weeks ago Spinal stenosis of lumbar region at multiple levels  ? [facility] Reubin Milan, MD  ? 3 months ago Hyperlipidemia associated with type 2 diabetes mellitus (HCC)  ? [facility] Reubin Milan, MD  ? 7 months ago Annual physical exam  ? [facility] Reubin Milan, MD  ? 10 months ago PSVT (paroxysmal supraventricular tachycardia) (HCC)  ? [facility] Reubin Milan, MD  ? 1 year ago Post herpetic neuralgia  ? [facility] Reubin Milan, MD  ? ?  ?  ?Future Appointments   ? ?        ? In 1 week Reubin Milan, MD [facility]  ? ?  ? ? ?  ?  ?  ? ? ? ? ?

## 2021-11-27 NOTE — Telephone Encounter (Signed)
Requested medication (s) are due for refill today: yes ? ?Requested medication (s) are on the active medication list: yes ? ?Last refill:  10/13/21 ? ?Future visit scheduled: yes ? ?Notes to clinic:  THIS IS A DUPLICATE REQUEST-- NT not delegated to refuse this med ? ? ?Requested Prescriptions  ?Pending Prescriptions Disp Refills  ? pregabalin (LYRICA) 100 MG capsule [Pharmacy Med Name: PREGABALIN 100 MG CAPSULE] 90 capsule 0  ?  Sig: TAKE 1 CAPSULE (100 MG TOTAL) BY MOUTH 3 TIMES A DAY  ?  ? Not Delegated - Neurology:  Anticonvulsants - Controlled - pregabalin Failed - 11/26/2021  9:14 AM  ?  ?  Failed - This refill cannot be delegated  ?  ?  Passed - Cr in normal range and within 360 days  ?  Creatinine  ?Date Value Ref Range Status  ?04/29/2014 0.91 0.60 - 1.30 mg/dL Final  ? ?Creatinine, Ser  ?Date Value Ref Range Status  ?01/27/2021 0.77 0.57 - 1.00 mg/dL Final  ?  ?  ?  ?  Passed - Completed PHQ-2 or PHQ-9 in the last 360 days  ?  ?  Passed - Valid encounter within last 12 months  ?  Recent Outpatient Visits   ? ?      ? 2 weeks ago Spinal stenosis of lumbar region at multiple levels  ? [facility] Reubin Milan, MD  ? 3 months ago Hyperlipidemia associated with type 2 diabetes mellitus (HCC)  ? [facility] Reubin Milan, MD  ? 7 months ago Annual physical exam  ? [facility] Reubin Milan, MD  ? 10 months ago PSVT (paroxysmal supraventricular tachycardia) (HCC)  ? [facility] Reubin Milan, MD  ? 1 year ago Post herpetic neuralgia  ? [facility] Reubin Milan, MD  ? ?  ?  ?Future Appointments   ? ?        ? In 1 week Reubin Milan, MD [facility]  ? ?  ? ? ?  ?  ?  ? ? ? ? ?

## 2021-11-30 ENCOUNTER — Other Ambulatory Visit: Payer: Self-pay | Admitting: Internal Medicine

## 2021-11-30 DIAGNOSIS — B0229 Other postherpetic nervous system involvement: Secondary | ICD-10-CM

## 2021-11-30 MED ORDER — PREGABALIN 100 MG PO CAPS: ORAL_CAPSULE | ORAL | 5 refills | Status: DC

## 2021-11-30 NOTE — Telephone Encounter (Signed)
Copied from CRM [id number]. Topic: General - Other ?>> Nov 30, 2021  8:53 AM Gaetana Michaelis A wrote: ?Reason for CRM: Medication Refill - Medication: pregabalin (LYRICA) 100 MG capsule [[id number]]  ? ?Has the patient contacted their pharmacy? Yes.  The patient was directed to contact their PCP. ?(Agent: If no, request that the patient contact the pharmacy for the refill. If patient does not wish to contact the pharmacy document the reason why and proceed with request.) ?(Agent: If yes, when and what did the pharmacy advise?) ? ?Preferred Pharmacy (with phone number or street name): [id number] - [place], [place] - [address] ?[address] [place] [address] ?Phone: [phone] Fax: [phone] ?Hours: Not open 24 hours ? ?Has the patient been seen for an appointment in the last year OR does the patient have an upcoming appointment? Yes.   ? ?Agent: Please be advised that RX refills may take up to 3 business days. We ask that you follow-up with your pharmacy. ?

## 2021-11-30 NOTE — Telephone Encounter (Signed)
Requested medication (s) are due for refill today: yes ? ?Requested medication (s) are on the active medication list: yes ? ?Last refill:  10/13/21 #90 0 refills ? ?Future visit scheduled: yes in 1 week ? ?Notes to clinic:  not delegated per protocol.  ? ? ?  ?Requested Prescriptions  ?Pending Prescriptions Disp Refills  ? pregabalin (LYRICA) 100 MG capsule [Pharmacy Med Name: PREGABALIN 100 MG CAPSULE] 90 capsule 0  ?  Sig: TAKE 1 CAPSULE (100 MG TOTAL) BY MOUTH 3 TIMES A DAY  ?  ? Not Delegated - Neurology:  Anticonvulsants - Controlled - pregabalin Failed - 11/27/2021  4:11 PM  ?  ?  Failed - This refill cannot be delegated  ?  ?  Passed - Cr in normal range and within 360 days  ?  Creatinine  ?Date Value Ref Range Status  ?04/29/2014 0.91 0.60 - 1.30 mg/dL Final  ? ?Creatinine, Ser  ?Date Value Ref Range Status  ?01/27/2021 0.77 0.57 - 1.00 mg/dL Final  ?  ?  ?  ?  Passed - Completed PHQ-2 or PHQ-9 in the last 360 days  ?  ?  Passed - Valid encounter within last 12 months  ?  Recent Outpatient Visits   ? ?      ? 3 weeks ago Spinal stenosis of lumbar region at multiple levels  ? [facility] Reubin Milan, MD  ? 3 months ago Hyperlipidemia associated with type 2 diabetes mellitus (HCC)  ? [facility] Reubin Milan, MD  ? 7 months ago Annual physical exam  ? [facility] Reubin Milan, MD  ? 10 months ago PSVT (paroxysmal supraventricular tachycardia) (HCC)  ? [facility] Reubin Milan, MD  ? 1 year ago Post herpetic neuralgia  ? [facility] Reubin Milan, MD  ? ?  ?  ?Future Appointments   ? ?        ? In 1 week Reubin Milan, MD [facility]  ? ?  ? ? ?  ?  ?  ? ?

## 2021-12-01 NOTE — Telephone Encounter (Signed)
Requested medication (s) are due for refill today: No ? ?Requested medication (s) are on the active medication list: yes   ? ?Last refill: 11/30/21  #90  5 refills ? ?Future visit scheduled yes 12/08/21 ? ?Notes to clinic: Refill of yesterday. Cannot refuse non delegated meds per protocol. Receipt confirmed by pharmacy 11/30/21  at 1208 ? ?Requested Prescriptions  ?Pending Prescriptions Disp Refills  ? pregabalin (LYRICA) 100 MG capsule 90 capsule 5  ?  Sig: TAKE 1 CAPSULE (100 MG TOTAL) BY MOUTH THREE TIMES DAILY.  ?  ? Not Delegated - Neurology:  Anticonvulsants - Controlled - pregabalin Failed - 11/30/2021  5:22 PM  ?  ?  Failed - This refill cannot be delegated  ?  ?  Passed - Cr in normal range and within 360 days  ?  Creatinine  ?Date Value Ref Range Status  ?04/29/2014 0.91 0.60 - 1.30 mg/dL Final  ? ?Creatinine, Ser  ?Date Value Ref Range Status  ?01/27/2021 0.77 0.57 - 1.00 mg/dL Final  ?  ?  ?  ?  Passed - Completed PHQ-2 or PHQ-9 in the last 360 days  ?  ?  Passed - Valid encounter within last 12 months  ?  Recent Outpatient Visits   ? ?      ? 3 weeks ago Spinal stenosis of lumbar region at multiple levels  ? [facility] Reubin Milan, MD  ? 3 months ago Hyperlipidemia associated with type 2 diabetes mellitus (HCC)  ? [facility] Reubin Milan, MD  ? 7 months ago Annual physical exam  ? [facility] Reubin Milan, MD  ? 10 months ago PSVT (paroxysmal supraventricular tachycardia) (HCC)  ? [facility] Reubin Milan, MD  ? 1 year ago Post herpetic neuralgia  ? [facility] Reubin Milan, MD  ? ?  ?  ?Future Appointments   ? ?        ? In 1 week Reubin Milan, MD [facility]  ? ?  ? ? ?  ?  ?  ? ? ? ? ?

## 2021-12-08 ENCOUNTER — Encounter: Payer: Self-pay | Admitting: Internal Medicine

## 2021-12-08 ENCOUNTER — Ambulatory Visit: Payer: Medicare PPO | Admitting: Internal Medicine

## 2021-12-08 VITALS — BP 138/74 | HR 76 | Ht 66.0 in | Wt 168.0 lb

## 2021-12-08 DIAGNOSIS — T753XXA Motion sickness, initial encounter: Secondary | ICD-10-CM

## 2021-12-08 DIAGNOSIS — E118 Type 2 diabetes mellitus with unspecified complications: Secondary | ICD-10-CM

## 2021-12-08 DIAGNOSIS — B0229 Other postherpetic nervous system involvement: Secondary | ICD-10-CM | POA: Diagnosis not present

## 2021-12-08 DIAGNOSIS — Z0100 Encounter for examination of eyes and vision without abnormal findings: Secondary | ICD-10-CM | POA: Diagnosis not present

## 2021-12-08 DIAGNOSIS — F411 Generalized anxiety disorder: Secondary | ICD-10-CM

## 2021-12-08 DIAGNOSIS — E119 Type 2 diabetes mellitus without complications: Secondary | ICD-10-CM | POA: Diagnosis not present

## 2021-12-08 LAB — HM DIABETES EYE EXAM

## 2021-12-08 LAB — POCT GLYCOSYLATED HEMOGLOBIN (HGB A1C): Hemoglobin A1C: 6.7 % — AB (ref 4.0–5.6)

## 2021-12-08 MED ORDER — FLUTICASONE PROPIONATE 50 MCG/ACT NA SUSP: 1.0000 | Freq: Every day | NASAL | 2 refills | Status: DC

## 2021-12-08 MED ORDER — SCOPOLAMINE 1 MG/3DAYS TD PT72: 1.0000 | MEDICATED_PATCH | TRANSDERMAL | 0 refills | Status: DC

## 2021-12-08 NOTE — Progress Notes (Signed)
? ? ?Date:  12/08/2021  ? ?Name:  Jordan Montoya   DOB:  Dec 16, 1933   MRN:  [id number] ? ? ?Chief Complaint: No chief complaint on file. ? ?Anxiety ?Presents for follow-up visit. Symptoms include nervous/anxious behavior. Patient reports no chest pain, dizziness, palpitations or shortness of breath. Symptoms occur most days. The quality of sleep is good.  ? ?Compliance with medications is 76-100%.  ?Diabetes ?She presents for her follow-up diabetic visit. She has type 2 diabetes mellitus. Hypoglycemia symptoms include nervousness/anxiousness. Pertinent negatives for hypoglycemia include no dizziness, headaches or tremors. Pertinent negatives for diabetes include no chest pain, no fatigue, no polydipsia and no polyuria. Current diabetic treatment includes oral agent (monotherapy). She is compliant with treatment all of the time.  ? ?Lab Results  ?Component Value Date  ? NA 142 01/27/2021  ? K 4.8 01/27/2021  ? CO2 21 01/27/2021  ? GLUCOSE 98 01/27/2021  ? BUN 16 01/27/2021  ? CREATININE 0.77 01/27/2021  ? CALCIUM 9.3 01/27/2021  ? EGFR 76 01/27/2021  ? GFRNONAA 59 (L) 04/14/2020  ? ?Lab Results  ?Component Value Date  ? CHOL 185 08/10/2021  ? HDL 65 08/10/2021  ? [place] 90 08/10/2021  ? TRIG 175 (H) 08/10/2021  ? CHOLHDL 2.8 08/10/2021  ? ?Lab Results  ?Component Value Date  ? TSH 3.010 01/27/2021  ? ?Lab Results  ?Component Value Date  ? HGBA1C 6.4 (H) 08/10/2021  ? ?Lab Results  ?Component Value Date  ? WBC 6.5 01/27/2021  ? HGB 12.4 01/27/2021  ? HCT 37.9 01/27/2021  ? MCV 89 01/27/2021  ? PLT 262 01/27/2021  ? ?Lab Results  ?Component Value Date  ? ALT 13 01/27/2021  ? AST 18 01/27/2021  ? ALKPHOS 78 01/27/2021  ? BILITOT 0.3 01/27/2021  ? ?Lab Results  ?Component Value Date  ? VD25OH 19.0 (L) 02/02/2016  ?  ? ?Review of Systems  ?Constitutional:  Positive for appetite change. Negative for fatigue, fever and unexpected weight change.  ?HENT:  Negative for tinnitus and trouble swallowing.   ?Eyes:  Negative  for visual disturbance.  ?Respiratory:  Negative for cough, chest tightness and shortness of breath.   ?Cardiovascular:  Negative for chest pain, palpitations and leg swelling.  ?Gastrointestinal:  Negative for abdominal pain.  ?Endocrine: Negative for polydipsia and polyuria.  ?Genitourinary:  Negative for dysuria and hematuria.  ?Musculoskeletal:  Positive for arthralgias, back pain and gait problem.  ?Skin:  Negative for rash.  ?Neurological:  Negative for dizziness, tremors, numbness and headaches.  ?Psychiatric/Behavioral:  Negative for dysphoric mood and sleep disturbance. The patient is nervous/anxious.   ? ?Patient Active Problem List  ? Diagnosis Date Noted  ? Psoriatic arthritis ([place]) 08/10/2021  ? Benign essential hypertension 04/09/2021  ? PSVT (paroxysmal supraventricular tachycardia) ([place]) 01/27/2021  ? Chronic pain of right knee 10/23/2020  ? Osteoporosis 06/04/2020  ? Chronic pain syndrome 06/11/2019  ? Post herpetic neuralgia 03/20/2019  ? Spinal stenosis of lumbar region at multiple levels 03/16/2019  ? Anxiety disorder 11/16/2018  ? Primary osteoarthritis of both hands 08/16/2018  ? Arthralgia of multiple sites 05/18/2018  ? Pernicious anemia 12/29/2017  ? Type II diabetes mellitus with complication ([place]) 57/08/7791  ? Environmental and seasonal allergies 05/30/2015  ? Hip pain, chronic 01/27/2015  ? Avitaminosis D 11/25/2014  ? Psoriasis 11/25/2014  ? Hyperlipidemia associated with type 2 diabetes mellitus ([place]) 08/20/2011  ? ? ?Allergies  ?Allergen Reactions  ? Metformin Nausea Only  ? Macrobid [Nitrofurantoin] Rash  ? ? ?  Past Surgical History:  ?Procedure Laterality Date  ? ABDOMINAL HYSTERECTOMY    ? BREAST BIOPSY Left   ? neg-bx/clip  ? BREAST CYST EXCISION Left   ? neg  ? EYE SURGERY Bilateral   ? cataracts  ? PARTIAL HYSTERECTOMY    ? one ovary remains  ? ? ?Social History  ? ?Tobacco Use  ? Smoking status: Former  ?  Packs/day: 0.25  ?  Years: 20.00  ?  Pack years: 5.00  ?  Types:  Cigarettes  ?  Quit date: 10/10/1979  ?  Years since quitting: 42.1  ? Smokeless tobacco: Never  ? Tobacco comments:  ?  Smoking cessation not required  ?Vaping Use  ? Vaping Use: Never used  ?Substance Use Topics  ? Alcohol use: No  ?  Alcohol/week: 0.0 standard drinks  ? Drug use: No  ? ? ? ?Medication list has been reviewed and updated. ? ?Current Meds  ?Medication Sig  ? Acetaminophen (TYLENOL ARTHRITIS EXT RELIEF PO) Take by mouth daily as needed.  ? ALPRAZolam (XANAX) 0.25 MG tablet Take 1 tablet (0.25 mg total) by mouth daily as needed. for anxiety  ? Azelastine HCl (ASTEPRO) 0.15 % SOLN Place 1 spray into the nose 2 (two) times daily.  ? COSENTYX SENSOREADY, 300 MG, 150 MG/ML SOAJ   ? docusate sodium (COLACE) 100 MG capsule Take 200 mg by mouth 2 (two) times daily as needed for mild constipation.  ? estradiol (ESTRACE VAGINAL) 0.1 MG/GM vaginal cream Apply 0.69m (pea-sized amount)  just inside the vaginal introitus with a finger-tip on Monday, Wednesday and Friday nights.  ? JANUVIA 100 MG tablet TAKE 1 TABLET (100 MG TOTAL) BY MOUTH DAILY.  ? MYRBETRIQ 25 MG TB24 tablet TAKE 1 TABLET BY MOUTH EVERY DAY  ? naproxen sodium (ALEVE) 220 MG tablet Take 220 mg by mouth.  ? pregabalin (LYRICA) 100 MG capsule TAKE 1 CAPSULE (100 MG TOTAL) BY MOUTH THREE TIMES DAILY. (Patient taking differently: Take 100 mg by mouth 2 (two) times daily. TAKE 1 CAPSULE (100 MG TOTAL) BY MOUTH TWICE DAILY.)  ? Probiotic Product (PROBIOTIC-10 PO) Take by mouth.  ? scopolamine (TRANSDERM SCOP, 1.5 MG,) 1 MG/3DAYS Place 1 patch (1.5 mg total) onto the skin every 3 (three) days.  ? traMADol-acetaminophen (ULTRACET) 37.5-325 MG tablet Take 0.5-1 tablets by mouth every 6 (six) hours as needed.  ? [DISCONTINUED] fluticasone (FLONASE) 50 MCG/ACT nasal spray Place 1 spray into both nostrils daily.  ? ? ? ?  12/08/2021  ? 10:21 AM 08/10/2021  ?  9:54 AM 04/09/2021  ?  9:48 AM 01/27/2021  ? 10:25 AM  ?GAD 7 : Generalized Anxiety Score  ?Nervous,  Anxious, on Edge 0 0 0 0  ?Control/stop worrying 0 0 0 1  ?Worry too much - different things 0 0 0 1  ?Trouble relaxing 1 0 0 0  ?Restless 1 0 0 0  ?Easily annoyed or irritable 0 0 0 0  ?Afraid - awful might happen 0 0 0 0  ?Total GAD 7 Score 2 0 0 2  ?Anxiety Difficulty Not difficult at all Not difficult at all    ? ? ? ?  12/08/2021  ? 10:21 AM  ?Depression screen PHQ 2/9  ?Decreased Interest 0  ?Down, Depressed, Hopeless 0  ?PHQ - 2 Score 0  ?Altered sleeping 0  ?Tired, decreased energy 1  ?Change in appetite 0  ?Feeling bad or failure about yourself  0  ?Trouble concentrating 0  ?Moving  slowly or fidgety/restless 0  ?Suicidal thoughts 0  ?PHQ-9 Score 1  ?Difficult doing work/chores Not difficult at all  ? ? ?BP Readings from Last 3 Encounters:  ?12/08/21 138/74  ?11/09/21 135/78  ?08/22/21 138/74  ? ? ?Physical Exam ?Vitals and nursing note reviewed.  ?Constitutional:   ?   General: She is not in acute distress. ?   Appearance: Normal appearance. She is well-developed.  ?HENT:  ?   Head: Normocephalic and atraumatic.  ?Neck:  ?   Vascular: No carotid bruit.  ?Cardiovascular:  ?   Rate and Rhythm: Normal rate and regular rhythm.  ?   Pulses: Normal pulses.  ?Pulmonary:  ?   Effort: Pulmonary effort is normal. No respiratory distress.  ?   Breath sounds: No wheezing or rhonchi.  ?Musculoskeletal:  ?   Cervical back: Normal range of motion.  ?   Right lower leg: No edema.  ?   Left lower leg: No edema.  ?Lymphadenopathy:  ?   Cervical: No cervical adenopathy.  ?Skin: ?   General: Skin is warm and dry.  ?   Capillary Refill: Capillary refill takes less than 2 seconds.  ?   Findings: No rash.  ?Neurological:  ?   General: No focal deficit present.  ?   Mental Status: She is alert and oriented to person, place, and time.  ?   Gait: Gait abnormal (uses walker).  ?Psychiatric:     ?   Mood and Affect: Mood normal.     ?   Behavior: Behavior normal.  ? ? ?Wt Readings from Last 3 Encounters:  ?12/08/21 168 lb (76.2 kg)   ?11/09/21 166 lb (75.3 kg)  ?08/22/21 167 lb (75.8 kg)  ? ? ?BP 138/74   Pulse 76   Ht _0  (1.676 m)   Wt 168 lb (76.2 kg)   SpO2 95%   BMI 27.12 kg/m?  ? ?Assessment and Plan: ?1. Type II diabetes mellitus with compli

## 2021-12-09 LAB — MICROALBUMIN / CREATININE URINE RATIO
Creatinine, Urine: 145.5 mg/dL
Microalb/Creat Ratio: 7 mg/g creat (ref 0–29)
Microalbumin, Urine: 10 ug/mL

## 2021-12-10 ENCOUNTER — Other Ambulatory Visit: Payer: Self-pay | Admitting: Internal Medicine

## 2021-12-10 NOTE — Telephone Encounter (Signed)
Requested Prescriptions  ?Pending Prescriptions Disp Refills  ?? JANUVIA 100 MG tablet [Pharmacy Med Name: JANUVIA 100 MG TABLET] 90 tablet 1  ?  Sig: TAKE 1 TABLET BY MOUTH EVERY DAY  ?  ? Endocrinology:  Diabetes - DPP-4 Inhibitors Passed - 12/10/2021  2:41 AM  ?  ?  Passed - HBA1C is between 0 and 7.9 and within 180 days  ?  Hemoglobin A1C  ?Date Value Ref Range Status  ?12/08/2021 6.7 (A) 4.0 - 5.6 % Final  ? ?Hgb A1c MFr Bld  ?Date Value Ref Range Status  ?08/10/2021 6.4 (H) 4.8 - 5.6 % Final  ?  Comment:  ?           Prediabetes: 5.7 - 6.4 ?         Diabetes: >6.4 ?         Glycemic control for adults with diabetes: <7.0 ?  ?   ?  ?  Passed - Cr in normal range and within 360 days  ?  Creatinine  ?Date Value Ref Range Status  ?04/29/2014 0.91 0.60 - 1.30 mg/dL Final  ? ?Creatinine, Ser  ?Date Value Ref Range Status  ?01/27/2021 0.77 0.57 - 1.00 mg/dL Final  ?   ?  ?  Passed - Valid encounter within last 6 months  ?  Recent Outpatient Visits   ?      ? 2 days ago Type II diabetes mellitus with complication (HCC)  ? [facility] Reubin Milan, MD  ? 1 month ago Spinal stenosis of lumbar region at multiple levels  ? [facility] Reubin Milan, MD  ? 4 months ago Hyperlipidemia associated with type 2 diabetes mellitus (HCC)  ? [facility] Reubin Milan, MD  ? 8 months ago Annual physical exam  ? [facility] Reubin Milan, MD  ? 10 months ago PSVT (paroxysmal supraventricular tachycardia) (HCC)  ? [facility] Reubin Milan, MD  ?  ?  ?Future Appointments   ?        ? In 4 months Judithann Graves Nyoka Cowden, MD [facility]  ?  ? ?  ?  ?  ? ? ?

## 2021-12-16 DIAGNOSIS — Z79899 Other long term (current) drug therapy: Secondary | ICD-10-CM | POA: Diagnosis not present

## 2021-12-16 DIAGNOSIS — B0229 Other postherpetic nervous system involvement: Secondary | ICD-10-CM | POA: Diagnosis not present

## 2021-12-16 DIAGNOSIS — L400 Psoriasis vulgaris: Secondary | ICD-10-CM | POA: Diagnosis not present

## 2021-12-16 DIAGNOSIS — L4050 Arthropathic psoriasis, unspecified: Secondary | ICD-10-CM | POA: Diagnosis not present

## 2021-12-16 DIAGNOSIS — H61001 Unspecified perichondritis of right external ear: Secondary | ICD-10-CM | POA: Diagnosis not present

## 2021-12-22 DIAGNOSIS — Z79899 Other long term (current) drug therapy: Secondary | ICD-10-CM | POA: Diagnosis not present

## 2021-12-30 ENCOUNTER — Encounter: Payer: Self-pay | Admitting: Internal Medicine

## 2022-01-14 ENCOUNTER — Other Ambulatory Visit: Payer: Self-pay | Admitting: Urology

## 2022-02-23 ENCOUNTER — Encounter: Payer: Self-pay | Admitting: Urology

## 2022-02-23 ENCOUNTER — Ambulatory Visit: Payer: Medicare PPO | Admitting: Urology

## 2022-02-23 VITALS — BP 149/74 | HR 92 | Ht 66.0 in | Wt 169.0 lb

## 2022-02-23 DIAGNOSIS — N390 Urinary tract infection, site not specified: Secondary | ICD-10-CM

## 2022-02-23 DIAGNOSIS — N3946 Mixed incontinence: Secondary | ICD-10-CM | POA: Diagnosis not present

## 2022-02-23 DIAGNOSIS — Z87440 Personal history of urinary (tract) infections: Secondary | ICD-10-CM

## 2022-02-23 DIAGNOSIS — N3281 Overactive bladder: Secondary | ICD-10-CM | POA: Diagnosis not present

## 2022-02-23 MED ORDER — MIRABEGRON ER 25 MG PO TB24: 25.0000 mg | ORAL_TABLET | Freq: Every day | ORAL | 11 refills | Status: DC

## 2022-02-23 NOTE — Progress Notes (Signed)
   02/23/2022 9:59 AM   Jordan Montoya 15-Dec-1933 [id number]  Reason for visit: Follow up recurrent UTIs, OAB  HPI: Frail 88 year old female with recurrent E. coli UTIs.  She is on cranberry tablet prophylaxis and Premarin cream, as well as 50 mg daily nitrofurantoin prophylaxis.  She denies any UTIs over the last year and has been doing extremely well.  We previously trialed her off of the nitrofurantoin prophylaxis and she had problems with infections at that time.  Using shared decision making, she is opted to continue low-dose nitrofurantoin prophylaxis long-term, and risks and benefits discussed extensively.  She is also on Myrbetriq 25 mg daily for OAB.  She feels this helps with the urgency, frequency, and urge incontinence.  She has some mild stress incontinence when getting up and moving around but this is not particularly bothersome.  We discussed options including discontinuing the Myrbetriq or increasing the dose, and she would like to stay at the 25 mg dose.  Behavioral strategies discussed regarding OAB.  -Continue topical estrogen cream, cranberry tablet prophylaxis, and low-dose nitrofurantoin prophylaxis for recurrent infections -Continue Myrbetriq for OAB -RTC 1 year, sooner if problems   Sondra Come, MD  [facility] [address] [place], [place] [address] [phone]

## 2022-03-08 ENCOUNTER — Other Ambulatory Visit: Payer: Self-pay

## 2022-03-08 ENCOUNTER — Ambulatory Visit: Payer: Self-pay

## 2022-03-08 DIAGNOSIS — G8929 Other chronic pain: Secondary | ICD-10-CM

## 2022-03-08 DIAGNOSIS — M48061 Spinal stenosis, lumbar region without neurogenic claudication: Secondary | ICD-10-CM

## 2022-03-08 DIAGNOSIS — G894 Chronic pain syndrome: Secondary | ICD-10-CM

## 2022-03-08 NOTE — Telephone Encounter (Signed)
    Chief Complaint: Low back pain. Requests a referral to Dr. Mariah Milling at [facility]. Symptoms: Pain Frequency: Started years ago Pertinent Negatives: Patient denies  Disposition: [] ED /[] Urgent Care (no appt availability in office) / [] Appointment(In office/virtual)/ []  [place] [facility]/ [] Home Care/ [] Refused Recommended Disposition /[] [place] [facility]/ [x]  Follow-up with PCP Additional Notes: Please advise pt.  Answer Assessment - Initial Assessment Questions 1. ONSET: "When did the pain begin?"      2 years 2. LOCATION: "Where does it hurt?" (upper, mid or lower back)     Low back 3. SEVERITY: "How bad is the pain?"  (e.g., Scale 1-10; mild, moderate, or severe)   - MILD (1-3): Doesn't interfere with normal activities.    - MODERATE (4-7): Interferes with normal activities or awakens from sleep.    - SEVERE (8-10): Excruciating pain, unable to do any normal activities.      Moderate 4. PATTERN: "Is the pain constant?" (e.g., yes, no; constant, intermittent)      Comes and goes 5. RADIATION: "Does the pain shoot into your legs or somewhere else?"     Legs 6. CAUSE:  "What do you think is causing the back pain?"      Unsure 7. BACK OVERUSE:  "Any recent lifting of heavy objects, strenuous work or exercise?"     No 8. MEDICINES: "What have you taken so far for the pain?" (e.g., nothing, acetaminophen, NSAIDS)     OTC 9. NEUROLOGIC SYMPTOMS: "Do you have any weakness, numbness, or problems with bowel/bladder control?"     No 10. OTHER SYMPTOMS: "Do you have any other symptoms?" (e.g., fever, abdomen pain, burning with urination, blood in urine)       No 11. PREGNANCY: "Is there any chance you are pregnant?" "When was your last menstrual period?"       No  Protocols used: Back Pain-A-AH

## 2022-03-08 NOTE — Telephone Encounter (Signed)
Referral placed.

## 2022-03-23 ENCOUNTER — Telehealth: Payer: Self-pay | Admitting: Urology

## 2022-03-23 DIAGNOSIS — N3946 Mixed incontinence: Secondary | ICD-10-CM

## 2022-03-23 DIAGNOSIS — N3281 Overactive bladder: Secondary | ICD-10-CM

## 2022-03-23 MED ORDER — MIRABEGRON ER 50 MG PO TB24: 50.0000 mg | ORAL_TABLET | Freq: Every day | ORAL | 0 refills | Status: DC

## 2022-03-23 NOTE — Telephone Encounter (Signed)
Per Dr. Keane Scrape note - ok to increase.  Samples of 50mg  Myrbetriq will be placed up front for the patient to pick up.  Patient notified.

## 2022-03-23 NOTE — Telephone Encounter (Signed)
Per provider's last note ok to increase dose. Samples for Myrbetriq 50mg  placed at the front desk in [facility] for patient to pick up. Pt informed. Med list updated.

## 2022-03-23 NOTE — Telephone Encounter (Signed)
Patient would like to increase the dosage of Myrbetriq.  She said that it is working well for her symptoms but is still having some leakage.    She states that Dr. Richardo Hanks told her that we could increase the dosage, just to let him know.   Is it okay for the patient to try the 50mg  Myrbetriq to see if her symptoms improve?  Do we have samples in [place] that she can pick up?  Please contact the patient to advise.

## 2022-03-25 DIAGNOSIS — M5442 Lumbago with sciatica, left side: Secondary | ICD-10-CM | POA: Diagnosis not present

## 2022-03-25 DIAGNOSIS — G8929 Other chronic pain: Secondary | ICD-10-CM | POA: Diagnosis not present

## 2022-03-25 DIAGNOSIS — M5441 Lumbago with sciatica, right side: Secondary | ICD-10-CM | POA: Diagnosis not present

## 2022-03-25 DIAGNOSIS — M48062 Spinal stenosis, lumbar region with neurogenic claudication: Secondary | ICD-10-CM | POA: Diagnosis not present

## 2022-03-26 DIAGNOSIS — E119 Type 2 diabetes mellitus without complications: Secondary | ICD-10-CM | POA: Diagnosis not present

## 2022-03-26 DIAGNOSIS — M48062 Spinal stenosis, lumbar region with neurogenic claudication: Secondary | ICD-10-CM | POA: Diagnosis not present

## 2022-04-09 DIAGNOSIS — M25552 Pain in left hip: Secondary | ICD-10-CM | POA: Diagnosis not present

## 2022-04-09 DIAGNOSIS — M5441 Lumbago with sciatica, right side: Secondary | ICD-10-CM | POA: Diagnosis not present

## 2022-04-09 DIAGNOSIS — M48062 Spinal stenosis, lumbar region with neurogenic claudication: Secondary | ICD-10-CM | POA: Diagnosis not present

## 2022-04-09 DIAGNOSIS — G8929 Other chronic pain: Secondary | ICD-10-CM | POA: Diagnosis not present

## 2022-04-09 DIAGNOSIS — M5442 Lumbago with sciatica, left side: Secondary | ICD-10-CM | POA: Diagnosis not present

## 2022-04-12 ENCOUNTER — Ambulatory Visit: Payer: Self-pay

## 2022-04-12 NOTE — Telephone Encounter (Signed)
     Chief Complaint: Blood glucose has been elevated since steroid shot 03/26/22. Running over 200. This morning 165. Declines appointment. Asking if she can continue to watch it, it "seems to be coming down." Symptoms: No symptoms. Frequency: 2 weeks Pertinent Negatives: Patient denies any symptoms Disposition: [] ED /[] Urgent Care (no appt availability in office) / [] Appointment(In office/virtual)/ []  [place] [facility]/ [] Home Care/ [] Refused Recommended Disposition /[] [place] [facility]/ [x]  Follow-up with PCP Additional Notes: Please advise pt.  Answer Assessment - Initial Assessment Questions 1. BLOOD GLUCOSE: "What is your blood glucose level?"      165 2. ONSET: "When did you check the blood glucose?"     Today 3. USUAL RANGE: "What is your glucose level usually?" (e.g., usual fasting morning value, usual evening value)     130 4. KETONES: "Do you check for ketones (urine or blood test strips)?" If Yes, ask: "What does the test show now?"      No 5. TYPE 1 or 2:  "Do you know what type of diabetes you have?"  (e.g., Type 1, Type 2, Gestational; doesn't know)      Type 2 6. INSULIN: "Do you take insulin?" "What type of insulin(s) do you use? What is the mode of delivery? (syringe, pen; injection or pump)?"      No 7. DIABETES PILLS: "Do you take any pills for your diabetes?" If Yes, ask: "Have you missed taking any pills recently?"     Yes 8. OTHER SYMPTOMS: "Do you have any symptoms?" (e.g., fever, frequent urination, difficulty breathing, dizziness, weakness, vomiting)     No 9. PREGNANCY: "Is there any chance you are pregnant?" "When was your last menstrual period?"     No  Protocols used: Diabetes - High Blood Sugar-A-AH

## 2022-04-12 NOTE — Telephone Encounter (Signed)
Patient informed its normal for blood sugar to be elevated for a few weeks, and should gradually continue to go down. Told her if it does not, come in to see [place] after another week.  - Jordan Montoya

## 2022-04-13 ENCOUNTER — Other Ambulatory Visit: Payer: Self-pay | Admitting: *Deleted

## 2022-04-13 DIAGNOSIS — N3946 Mixed incontinence: Secondary | ICD-10-CM

## 2022-04-13 DIAGNOSIS — N3281 Overactive bladder: Secondary | ICD-10-CM

## 2022-04-13 MED ORDER — MIRABEGRON ER 50 MG PO TB24: 50.0000 mg | ORAL_TABLET | Freq: Every day | ORAL | 11 refills | Status: DC

## 2022-04-14 ENCOUNTER — Telehealth: Payer: Self-pay | Admitting: Urology

## 2022-04-14 DIAGNOSIS — N3946 Mixed incontinence: Secondary | ICD-10-CM

## 2022-04-14 DIAGNOSIS — N3281 Overactive bladder: Secondary | ICD-10-CM

## 2022-04-14 MED ORDER — MIRABEGRON ER 50 MG PO TB24: 50.0000 mg | ORAL_TABLET | Freq: Every day | ORAL | 11 refills | Status: DC

## 2022-04-14 NOTE — Telephone Encounter (Signed)
RX corrected and sent into the pharmacy.

## 2022-04-14 NOTE — Telephone Encounter (Signed)
Patient called the office today to request a prescription for Myrbetriq 50mg  to the [facility] [place].    The Rx that was entered yesterday says "sample".

## 2022-05-05 ENCOUNTER — Encounter: Payer: Self-pay | Admitting: Internal Medicine

## 2022-05-05 ENCOUNTER — Ambulatory Visit (INDEPENDENT_AMBULATORY_CARE_PROVIDER_SITE_OTHER): Payer: Medicare PPO | Admitting: Internal Medicine

## 2022-05-05 VITALS — BP 122/62 | HR 83 | Ht 66.0 in | Wt 169.0 lb

## 2022-05-05 DIAGNOSIS — Z23 Encounter for immunization: Secondary | ICD-10-CM | POA: Diagnosis not present

## 2022-05-05 DIAGNOSIS — E1169 Type 2 diabetes mellitus with other specified complication: Secondary | ICD-10-CM | POA: Diagnosis not present

## 2022-05-05 DIAGNOSIS — D510 Vitamin B12 deficiency anemia due to intrinsic factor deficiency: Secondary | ICD-10-CM

## 2022-05-05 DIAGNOSIS — Z0000 Encounter for general adult medical examination without abnormal findings: Secondary | ICD-10-CM | POA: Diagnosis not present

## 2022-05-05 DIAGNOSIS — E559 Vitamin D deficiency, unspecified: Secondary | ICD-10-CM | POA: Diagnosis not present

## 2022-05-05 DIAGNOSIS — I10 Essential (primary) hypertension: Secondary | ICD-10-CM

## 2022-05-05 DIAGNOSIS — F411 Generalized anxiety disorder: Secondary | ICD-10-CM

## 2022-05-05 DIAGNOSIS — E118 Type 2 diabetes mellitus with unspecified complications: Secondary | ICD-10-CM | POA: Diagnosis not present

## 2022-05-05 DIAGNOSIS — L4050 Arthropathic psoriasis, unspecified: Secondary | ICD-10-CM

## 2022-05-05 DIAGNOSIS — Z1231 Encounter for screening mammogram for malignant neoplasm of breast: Secondary | ICD-10-CM

## 2022-05-05 DIAGNOSIS — M48061 Spinal stenosis, lumbar region without neurogenic claudication: Secondary | ICD-10-CM

## 2022-05-05 DIAGNOSIS — E785 Hyperlipidemia, unspecified: Secondary | ICD-10-CM | POA: Diagnosis not present

## 2022-05-05 NOTE — Progress Notes (Signed)
Date:  05/05/2022   Name:  Jordan Montoya   DOB:  [birth year]   MRN:  [id number]   Chief Complaint: Annual Exam and Flu Vaccine Jordan Montoya is a 88 y.o. female who presents today for her Complete Annual Exam. She feels fairly well. She reports exercising none. She reports she is sleeping fairly well. Breast complaints none.  Mammogram: 06/2021 DEXA: 06/2020 normal hip/OP spine Colonoscopy: aged out  Health Maintenance Due  Topic Date Due   INFLUENZA VACCINE  03/02/2022    Immunization History  Administered Date(s) Administered   Fluad Quad(high Dose 65+) 05/24/2019   Influenza, High Dose Seasonal PF 05/01/2018, 06/03/2020, 05/08/2021   Influenza-Unspecified 05/04/2016, 05/24/2017, 05/04/2018   Moderna Sars-Covid-2 Vaccination 10/01/2019, 10/29/2019, 07/02/2020   Pneumococcal Conjugate-13 06/06/2014   Pneumococcal Polysaccharide-23 06/16/2017   Tdap 08/20/2011   Zoster Recombinat (Shingrix) 12/06/2019, 03/03/2020    Diabetes She presents for her follow-up diabetic visit. She has type 2 diabetes mellitus. Her disease course has been stable. Pertinent negatives for hypoglycemia include no dizziness, headaches, nervousness/anxiousness or tremors. Pertinent negatives for diabetes include no chest pain, no fatigue, no polydipsia and no polyuria. Pertinent negatives for diabetic complications include no CVA. Current diabetic treatment includes oral agent (monotherapy) Celesta Gentile). She is compliant with treatment all of the time. Her weight is stable. An ACE inhibitor/angiotensin II receptor blocker is not being taken. Eye exam is current.  Hypertension This is a chronic problem. The problem is controlled. Pertinent negatives include no chest pain, headaches, palpitations or shortness of breath. There are no associated agents to hypertension. Past treatments include lifestyle changes. The current treatment provides moderate improvement. There are no compliance problems.  There  is no history of kidney disease, CAD/MI or CVA.  Back Pain This is a chronic problem. The problem is unchanged. The pain is present in the lumbar spine. Associated symptoms include bladder incontinence. Pertinent negatives include no abdominal pain, chest pain, dysuria, fever or headaches. Treatments tried: seen by pain management - Lumbar ESI no benefit, Tramadol 50 mg as needed.    Lab Results  Component Value Date   NA 142 01/27/2021   K 4.8 01/27/2021   CO2 21 01/27/2021   GLUCOSE 98 01/27/2021   BUN 16 01/27/2021   CREATININE 0.77 01/27/2021   CALCIUM 9.3 01/27/2021   EGFR 76 01/27/2021   GFRNONAA 59 (L) 04/14/2020   Lab Results  Component Value Date   CHOL 185 08/10/2021   HDL 65 08/10/2021   LDLCALC 90 08/10/2021   TRIG 175 (H) 08/10/2021   CHOLHDL 2.8 08/10/2021   Lab Results  Component Value Date   TSH 3.010 01/27/2021   Lab Results  Component Value Date   HGBA1C 6.7 (A) 12/08/2021   Lab Results  Component Value Date   WBC 6.5 01/27/2021   HGB 12.4 01/27/2021   HCT 37.9 01/27/2021   MCV 89 01/27/2021   PLT 262 01/27/2021   Lab Results  Component Value Date   ALT 13 01/27/2021   AST 18 01/27/2021   ALKPHOS 78 01/27/2021   BILITOT 0.3 01/27/2021   Lab Results  Component Value Date   VD25OH 19.0 (L) 02/02/2016     Review of Systems  Constitutional:  Negative for chills, fatigue and fever.  HENT:  Positive for hearing loss. Negative for congestion, tinnitus, trouble swallowing and voice change.   Eyes:  Negative for visual disturbance.  Respiratory:  Negative for cough, chest tightness, shortness of breath and wheezing.  Cardiovascular:  Negative for chest pain, palpitations and leg swelling.  Gastrointestinal:  Negative for abdominal pain, constipation, diarrhea and vomiting.  Endocrine: Negative for polydipsia and polyuria.  Genitourinary:  Positive for bladder incontinence. Negative for dysuria, frequency, genital sores, vaginal bleeding and  vaginal discharge.  Musculoskeletal:  Positive for back pain. Negative for arthralgias, gait problem and joint swelling.  Skin:  Negative for color change and rash.  Neurological:  Negative for dizziness, tremors, light-headedness and headaches.  Hematological:  Negative for adenopathy. Does not bruise/bleed easily.  Psychiatric/Behavioral:  Negative for dysphoric mood and sleep disturbance. The patient is not nervous/anxious.     Patient Active Problem List   Diagnosis Date Noted   Psoriatic arthritis ([place]) 08/10/2021   Benign essential hypertension 04/09/2021   PSVT (paroxysmal supraventricular tachycardia) 01/27/2021   Chronic pain of right knee 10/23/2020   Osteoporosis 06/04/2020   Chronic pain syndrome 06/11/2019   Post herpetic neuralgia 03/20/2019   Spinal stenosis of lumbar region at multiple levels 03/16/2019   Anxiety disorder 11/16/2018   Primary osteoarthritis of both hands 08/16/2018   Arthralgia of multiple sites 05/18/2018   Pernicious anemia 12/29/2017   Type II diabetes mellitus with complication ([place]) 48/25/0037   Environmental and seasonal allergies 05/30/2015   Hip pain, chronic 01/27/2015   Avitaminosis D 11/25/2014   Psoriasis 11/25/2014   Hyperlipidemia associated with type 2 diabetes mellitus ([place]) 08/20/2011    Allergies  Allergen Reactions   Metformin Nausea Only   Macrobid [Nitrofurantoin] Rash    Past Surgical History:  Procedure Laterality Date   ABDOMINAL HYSTERECTOMY     BREAST BIOPSY Left    neg-bx/clip   BREAST CYST EXCISION Left    neg   EYE SURGERY Bilateral    cataracts   PARTIAL HYSTERECTOMY     one ovary remains    Social History   Tobacco Use   Smoking status: Former    Packs/day: 0.25    Years: 20.00    Total pack years: 5.00    Types: Cigarettes    Quit date: 10/10/1979    Years since quitting: 42.5    Passive exposure: Past   Smokeless tobacco: Never   Tobacco comments:    Smoking cessation not required  Vaping  Use   Vaping Use: Never used  Substance Use Topics   Alcohol use: No    Alcohol/week: 0.0 standard drinks of alcohol   Drug use: No     Medication list has been reviewed and updated.  Current Meds  Medication Sig   Acetaminophen (TYLENOL ARTHRITIS EXT RELIEF PO) Take by mouth daily as needed.   ALPRAZolam (XANAX) 0.25 MG tablet Take 1 tablet (0.25 mg total) by mouth daily as needed. for anxiety   Azelastine HCl (ASTEPRO) 0.15 % SOLN Place 1 spray into the nose 2 (two) times daily.   docusate sodium (COLACE) 100 MG capsule Take 200 mg by mouth 2 (two) times daily as needed for mild constipation.   estradiol (ESTRACE VAGINAL) 0.1 MG/GM vaginal cream Apply 0.62m (pea-sized amount)  just inside the vaginal introitus with a finger-tip on Monday, Wednesday and Friday nights.   fluticasone (FLONASE) 50 MCG/ACT nasal spray Place 1 spray into both nostrils daily.   JANUVIA 100 MG tablet TAKE 1 TABLET BY MOUTH EVERY DAY   mirabegron ER (MYRBETRIQ) 50 MG TB24 tablet Take 1 tablet (50 mg total) by mouth daily.   naproxen sodium (ALEVE) 220 MG tablet Take 220 mg by mouth.   nitrofurantoin (MACRODANTIN) 50  MG capsule TAKE 1 CAPSULE BY MOUTH EVERY DAY   pregabalin (LYRICA) 100 MG capsule TAKE 1 CAPSULE (100 MG TOTAL) BY MOUTH THREE TIMES DAILY.   Probiotic Product (PROBIOTIC-10 PO) Take by mouth.   traMADol (ULTRAM) 50 MG tablet Take by mouth every 12 (twelve) hours as needed.   [DISCONTINUED] traMADol-acetaminophen (ULTRACET) 37.5-325 MG tablet Take 0.5-1 tablets by mouth every 6 (six) hours as needed.       05/05/2022   10:55 AM 12/08/2021   10:21 AM 08/10/2021    9:54 AM 04/09/2021    9:48 AM  GAD 7 : Generalized Anxiety Score  Nervous, Anxious, on Edge 0 0 0 0  Control/stop worrying 0 0 0 0  Worry too much - different things 0 0 0 0  Trouble relaxing 0 1 0 0  Restless 0 1 0 0  Easily annoyed or irritable 0 0 0 0  Afraid - awful might happen 0 0 0 0  Total GAD 7 Score 0 2 0 0  Anxiety  Difficulty Not difficult at all Not difficult at all Not difficult at all        05/05/2022   10:54 AM 12/08/2021   10:21 AM 08/10/2021    9:54 AM  Depression screen PHQ 2/9  Decreased Interest 0 0 0  Down, Depressed, Hopeless 0 0 0  PHQ - 2 Score 0 0 0  Altered sleeping 0 0 0  Tired, decreased energy 0 1 1  Change in appetite 0 0 0  Feeling bad or failure about yourself  0 0 0  Trouble concentrating 0 0 0  Moving slowly or fidgety/restless 0 0 0  Suicidal thoughts 0 0 0  PHQ-9 Score 0 1 1  Difficult doing work/chores Not difficult at all Not difficult at all Not difficult at all    BP Readings from Last 3 Encounters:  05/05/22 122/62  02/23/22 (!) 149/74  12/08/21 138/74    Physical Exam Vitals and nursing note reviewed.  Constitutional:      General: She is not in acute distress.    Appearance: She is well-developed.  HENT:     Head: Normocephalic and atraumatic.     Right Ear: Tympanic membrane and ear canal normal.     Left Ear: Tympanic membrane and ear canal normal.     Nose:     Right Sinus: No maxillary sinus tenderness.     Left Sinus: No maxillary sinus tenderness.  Eyes:     General: No scleral icterus.       Right eye: No discharge.        Left eye: No discharge.     Conjunctiva/sclera: Conjunctivae normal.  Neck:     Thyroid: No thyromegaly.     Vascular: No carotid bruit.  Cardiovascular:     Rate and Rhythm: Normal rate and regular rhythm.     Pulses: Normal pulses.     Heart sounds: Normal heart sounds.  Pulmonary:     Effort: Pulmonary effort is normal. No respiratory distress.     Breath sounds: No wheezing.  Abdominal:     General: Bowel sounds are normal.     Palpations: Abdomen is soft.     Tenderness: There is no abdominal tenderness.  Musculoskeletal:     Cervical back: Normal range of motion. No erythema.     Right lower leg: No edema.     Left lower leg: No edema.  Lymphadenopathy:     Cervical: No cervical adenopathy.  Skin:     General: Skin is warm and dry.     Findings: No rash.  Neurological:     Mental Status: She is alert and oriented to person, place, and time.     Cranial Nerves: No cranial nerve deficit.     Sensory: No sensory deficit.     Deep Tendon Reflexes: Reflexes are normal and symmetric.  Psychiatric:        Attention and Perception: Attention normal.        Mood and Affect: Mood normal.     Wt Readings from Last 3 Encounters:  05/05/22 169 lb (76.7 kg)  02/23/22 169 lb (76.7 kg)  12/08/21 168 lb (76.2 kg)    BP 122/62 (BP Location: Right Arm, Cuff Size: Normal)   Pulse 83   Ht '5\' 6"'  (1.676 m)   Wt 169 lb (76.7 kg)   SpO2 94%   BMI 27.28 kg/m   Assessment and Plan: 1. Annual physical exam Normal exam for age. Up to date on screenings and immunizations.  2. Encounter for screening mammogram for breast cancer Recommend continued screenings - MM 3D SCREEN BREAST BILATERAL  3. Benign essential hypertension Clinically stable exam with well controlled BP. Tolerating medications without side effects at this time. Pt to continue current regimen and low sodium diet;  - CBC with Differential/Platelet - Comprehensive metabolic panel - TSH  4. Hyperlipidemia associated with type 2 diabetes mellitus ([place]) Not on statin therapy. - Lipid panel  5. Type II diabetes mellitus with complication ([facility]) Clinically stable by exam and report without s/s of hypoglycemia. DM complicated by hypertension and dyslipidemia.  Mild increase in BS after steroid injection but now improving. Tolerating medications well without side effects or other concerns - Comprehensive metabolic panel - Hemoglobin A1c  6. Avitaminosis D Not currently on supplements with very low levels in the past. - VITAMIN D 25 Hydroxy (Vit-D Deficiency, Fractures)  7. Psoriatic arthritis ([place]) On Cosentix  8. Pernicious anemia Previously on B12 but has stopped Mild neuropathy in toes - will check level - CBC with  Differential/Platelet - Vitamin B12  9. Generalized anxiety disorder Continue nightly Xanax - she has one more refill to use before an new Rx can be sent.  10. Need for immunization against influenza - Flu Vaccine QUAD High Dose(Fluad)  11. Spinal stenosis of lumbar region at multiple levels Seeing Pain management. Will refill Tramadol 50 mg bid prn when current Rx is out.   Partially dictated using Editor, commissioning. Any errors are unintentional.  [name], MD [facility]  05/05/2022

## 2022-05-05 NOTE — Patient Instructions (Signed)
Call [facility] to schedule your mammogram at [phone].  

## 2022-05-06 LAB — COMPREHENSIVE METABOLIC PANEL
ALT: 19 IU/L (ref 0–32)
AST: 22 IU/L (ref 0–40)
Albumin/Globulin Ratio: 2.1 (ref 1.2–2.2)
Albumin: 4.8 g/dL — ABNORMAL HIGH (ref 3.7–4.7)
Alkaline Phosphatase: 87 IU/L (ref 44–121)
BUN/Creatinine Ratio: 23 (ref 12–28)
BUN: 18 mg/dL (ref 8–27)
Bilirubin Total: 0.3 mg/dL (ref 0.0–1.2)
CO2: 22 mmol/L (ref 20–29)
Calcium: 9.6 mg/dL (ref 8.7–10.3)
Chloride: 103 mmol/L (ref 96–106)
Creatinine, Ser: 0.78 mg/dL (ref 0.57–1.00)
Globulin, Total: 2.3 g/dL (ref 1.5–4.5)
Glucose: 126 mg/dL — ABNORMAL HIGH (ref 70–99)
Potassium: 4.6 mmol/L (ref 3.5–5.2)
Sodium: 141 mmol/L (ref 134–144)
Total Protein: 7.1 g/dL (ref 6.0–8.5)
eGFR: 74 mL/min/{1.73_m2} (ref >59)

## 2022-05-06 LAB — CBC WITH DIFFERENTIAL/PLATELET
Basophils Absolute: 0 10*3/uL (ref 0.0–0.2)
Basos: 0 %
EOS (ABSOLUTE): 0.1 10*3/uL (ref 0.0–0.4)
Eos: 2 %
Hematocrit: 36.2 % (ref 34.0–46.6)
Hemoglobin: 12.2 g/dL (ref 11.1–15.9)
Immature Grans (Abs): 0 10*3/uL (ref 0.0–0.1)
Immature Granulocytes: 0 %
Lymphocytes Absolute: 1.7 10*3/uL (ref 0.7–3.1)
Lymphs: 36 %
MCH: 29.3 pg (ref 26.6–33.0)
MCHC: 33.7 g/dL (ref 31.5–35.7)
MCV: 87 fL (ref 79–97)
Monocytes Absolute: 0.4 10*3/uL (ref 0.1–0.9)
Monocytes: 9 %
Neutrophils Absolute: 2.5 10*3/uL (ref 1.4–7.0)
Neutrophils: 53 %
Platelets: 243 10*3/uL (ref 150–450)
RBC: 4.16 x10E6/uL (ref 3.77–5.28)
RDW: 12.9 % (ref 11.7–15.4)
WBC: 4.8 10*3/uL (ref 3.4–10.8)

## 2022-05-06 LAB — LIPID PANEL
Chol/HDL Ratio: 3.3 ratio (ref 0.0–4.4)
Cholesterol, Total: 198 mg/dL (ref 100–199)
HDL: 60 mg/dL (ref >39)
LDL Chol Calc (NIH): 105 mg/dL — ABNORMAL HIGH (ref 0–99)
Triglycerides: 190 mg/dL — ABNORMAL HIGH (ref 0–149)
VLDL Cholesterol Cal: 33 mg/dL (ref 5–40)

## 2022-05-06 LAB — HEMOGLOBIN A1C
Est. average glucose Bld gHb Est-mCnc: 154 mg/dL
Hgb A1c MFr Bld: 7 % — ABNORMAL HIGH (ref 4.8–5.6)

## 2022-05-06 LAB — VITAMIN B12: Vitamin B-12: 155 pg/mL — ABNORMAL LOW (ref 232–1245)

## 2022-05-06 LAB — TSH: TSH: 2.83 u[IU]/mL (ref 0.450–4.500)

## 2022-05-06 LAB — VITAMIN D 25 HYDROXY (VIT D DEFICIENCY, FRACTURES): Vit D, 25-Hydroxy: 19.4 ng/mL — ABNORMAL LOW (ref 30.0–100.0)

## 2022-05-17 ENCOUNTER — Ambulatory Visit: Payer: Medicare PPO

## 2022-05-21 ENCOUNTER — Ambulatory Visit: Payer: Medicare PPO

## 2022-05-27 ENCOUNTER — Other Ambulatory Visit: Payer: Self-pay | Admitting: Internal Medicine

## 2022-05-27 NOTE — Telephone Encounter (Signed)
Requested Prescriptions  Pending Prescriptions Disp Refills  . JANUVIA 100 MG tablet [Pharmacy Med Name: JANUVIA 100 MG TABLET] 90 tablet 0    Sig: TAKE 1 TABLET BY [place] DAY     Endocrinology:  Diabetes - DPP-4 Inhibitors Passed - 05/27/2022  1:37 AM      Passed - HBA1C is between 0 and 7.9 and within 180 days    Hgb A1c MFr Bld  Date Value Ref Range Status  05/05/2022 7.0 (H) 4.8 - 5.6 % Final    Comment:             Prediabetes: 5.7 - 6.4          Diabetes: >6.4          Glycemic control for adults with diabetes: <7.0          Passed - Cr in normal range and within 360 days    Creatinine  Date Value Ref Range Status  04/29/2014 0.91 0.60 - 1.30 mg/dL Final   Creatinine, Ser  Date Value Ref Range Status  05/05/2022 0.78 0.57 - 1.00 mg/dL Final         Passed - Valid encounter within last 6 months    Recent Outpatient Visits          3 weeks ago Annual physical exam   [place] [facility], Jesse Sans, MD   5 months ago Type II diabetes mellitus with complication [facility])   [place] [facility] at [facility], Jesse Sans, MD   6 months ago Spinal stenosis of lumbar region at multiple levels   [facility] at [facility], Jesse Sans, MD   9 months ago Hyperlipidemia associated with type 2 diabetes mellitus [facility])   [place] [facility] at [facility], Jesse Sans, MD   1 year ago Annual physical exam   [facility] at [facility], Jesse Sans, MD      Future Appointments            In 9 months Diamantina Providence, Herbert Seta, MD [facility] [place]

## 2022-05-30 ENCOUNTER — Other Ambulatory Visit: Payer: Self-pay | Admitting: Internal Medicine

## 2022-05-30 DIAGNOSIS — B0229 Other postherpetic nervous system involvement: Secondary | ICD-10-CM

## 2022-05-31 ENCOUNTER — Ambulatory Visit: Payer: Self-pay | Admitting: *Deleted

## 2022-05-31 NOTE — Telephone Encounter (Signed)
Pt asked if her RX for pregabalin [facility] [address] [facility] [place] / she is out of medication and worried about not having this to take / please advise   Medication Refill - Medication: pregabalin   Has the patient contacted their pharmacy? Yes.   (Agent: If no, request that the patient contact the pharmacy for the refill. If patient does not wish to contact the pharmacy document the reason why and proceed with request.) (Agent: If yes, when and what did the pharmacy advise?) Request sent to office  Preferred Pharmacy (with phone number [facility] street name): [facility] [address] [facility] [place]  Has the patient been seen for an appointment in the last year OR does the patient have an upcoming appointment? Yes.    Agent: Please be advised that RX refills may take up to 3 business days. We ask that you follow-up with your pharmacy.

## 2022-05-31 NOTE — Telephone Encounter (Signed)
  Chief Complaint: Inquiring when her Lyrica would be ready that she called in for a refill for over the weekend. Symptoms: Nervous being without it.   Has one pill left. Frequency: Now Pertinent Negatives: Patient denies being without it and it makes her nervous Disposition: [] ED /[] Urgent Care (no appt availability in office) / [] Appointment(In office/virtual)/ []  [place] [facility]/ [] Home Care/ [] Refused Recommended Disposition /[] [place] [facility]/ []  Follow-up with PCP Additional Notes: I let her know it was in process and give up to 3 days for turn around on rx refills.   It rarely takes 3 days but it is being worked on.   I let her know to check with her drug store.   She was agreeable to this plan.

## 2022-05-31 NOTE — Telephone Encounter (Signed)
Requested medication (s) are due for refill today:   Provider to review  Requested medication (s) are on the active medication list:   Yes  Future visit scheduled:   No   Last ordered: 11/30/2021 #90, 5 refills  Non delegated refill   Requested Prescriptions  Pending Prescriptions Disp Refills   pregabalin (LYRICA) 100 MG capsule [Pharmacy Med Name: PREGABALIN 100 MG CAPSULE] 90 capsule     Sig: TAKE 1 CAPSULE (100 MG TOTAL) BY MOUTH 3 TIMES A DAY     Not Delegated - Neurology:  Anticonvulsants - Controlled - pregabalin Failed - 05/31/2022  8:39 AM      Failed - This refill cannot be delegated      Passed - Cr in normal range and within 360 days    Creatinine  Date Value Ref Range Status  04/29/2014 0.91 0.60 - 1.30 mg/dL Final   Creatinine, Ser  Date Value Ref Range Status  05/05/2022 0.78 0.57 - 1.00 mg/dL Final         Passed - Completed PHQ-2 or PHQ-9 in the last 360 days      Passed - Valid encounter within last 12 months    Recent Outpatient Visits           3 weeks ago Annual physical exam   [place] [facility] at [facility], Jesse Sans, MD   5 months ago Type II diabetes mellitus with complication [facility])   [place] and Sports Medicine at [facility], Jesse Sans, MD   6 months ago Spinal stenosis of lumbar region at multiple levels   [place] [facility], Jesse Sans, MD   9 months ago Hyperlipidemia associated with type 2 diabetes mellitus [facility])   [place] [facility] at [facility], Jesse Sans, MD   1 year ago Annual physical exam   [facility] at [facility], Jesse Sans, MD       Future Appointments             In 9 months Diamantina Providence, Herbert Seta, MD [facility] [place]

## 2022-05-31 NOTE — Telephone Encounter (Signed)
Requested medication (s) are due for refill today:   Provider to review.   See prior request for Lyrica.   She has called in several times.     Requested medication (s) are on the active medication list:     Future visit scheduled:      Last ordered:    Requested Prescriptions  Pending Prescriptions Disp Refills   pregabalin (LYRICA) 100 MG capsule [Pharmacy Med Name: PREGABALIN 100 MG CAPSULE] 90 capsule     Sig: TAKE 1 CAPSULE (100 MG TOTAL) BY MOUTH 3 TIMES A DAY     Not Delegated - Neurology:  Anticonvulsants - Controlled - pregabalin Failed - 05/31/2022  8:39 AM      Failed - This refill cannot be delegated      Passed - Cr in normal range and within 360 days    Creatinine  Date Value Ref Range Status  04/29/2014 0.91 0.60 - 1.30 mg/dL Final   Creatinine, Ser  Date Value Ref Range Status  05/05/2022 0.78 0.57 - 1.00 mg/dL Final         Passed - Completed PHQ-2 or PHQ-9 in the last 360 days      Passed - Valid encounter within last 12 months    Recent Outpatient Visits           3 weeks ago Annual physical exam   [place] [facility] at [facility], Jesse Sans, MD   5 months ago Type II diabetes mellitus with complication [facility])    Primary Care and Sports Medicine at [facility], Jesse Sans, MD   6 months ago Spinal stenosis of lumbar region at multiple levels   [place] and Sports Medicine at [facility], Jesse Sans, MD   9 months ago Hyperlipidemia associated with type 2 diabetes mellitus [facility])   [place] [facility] at [facility], Jesse Sans, MD   1 year ago Annual physical exam   [facility] at [facility], Jesse Sans, MD       Future Appointments             In 9 months Diamantina [place], Herbert Seta, MD [facility] [place]

## 2022-05-31 NOTE — Telephone Encounter (Signed)
Reason for Disposition  Caller has medicine question only, adult not sick, AND triager answers question  Answer Assessment - Initial Assessment Questions 1. NAME of MEDICINE: "What medicine(s) are you calling about?"     Lyrica 2. QUESTION: "What is your question?" (e.g., double dose of medicine, side effect)     I called it in over the weekend and the pharmacy says it is not ready yet. 3. PRESCRIBER: "Who prescribed the medicine?" Reason: if prescribed by specialist, call should be referred to that group.     Dr. Army Melia 4. SYMPTOMS: "Do you have any symptoms?" If Yes, ask: "What symptoms are you having?"  "How bad are the symptoms (e.g., mild, moderate, severe)     Pt is nervous and wondering when it will be filled. 5. PREGNANCY:  "Is there any chance that you are pregnant?" "When was your last menstrual period?"     N/A due to age  Protocols used: Medication Question Call-A-AH

## 2022-06-02 ENCOUNTER — Ambulatory Visit (INDEPENDENT_AMBULATORY_CARE_PROVIDER_SITE_OTHER): Payer: Medicare PPO

## 2022-06-02 VITALS — Ht 66.0 in | Wt 169.0 lb

## 2022-06-02 DIAGNOSIS — Z0000 Encounter for general adult medical examination without abnormal findings: Secondary | ICD-10-CM | POA: Diagnosis not present

## 2022-06-02 NOTE — Patient Instructions (Signed)
Thank you for enrolling in [facility]. Please follow the instructions below to securely access your online medical record. MyChart allows you to send messages to your doctor, view your test results, manage appointments, and more.   How Do I Sign Up? In your Internet browser, go to [employer] and enter [email or web address]. Click on the Sign Up Now link in the Sign In box. You will see the New Member Sign Up page. Enter your MyChart Access Code exactly as it appears below. You will not need to use this code after you've completed the sign-up process. If you do not sign up before the expiration date, you must request a new code.  MyChart Access Code: [email or web address] Expires: 07/08/2022  3:07 AM  Enter your Social Security Number ([id number]) and Date of Birth (mm/dd/yyyy) as indicated and click Submit. You will be taken to the next sign-up page. Create a MyChart ID. This will be your MyChart login ID and cannot be changed, so think of one that is secure and easy to remember. Create a Scientist, research (physical sciences). You can change your password at any time. Enter your Password Reset Question and Answer. This can be used at a later time if you forget your password.  Enter your e-mail address. You will receive e-mail notification when new information is available in [place]. Click Sign Up. You can now view your medical record.   Additional Information Remember, MyChart is NOT to be used for urgent needs. For medical emergencies, dial 911.

## 2022-06-02 NOTE — Progress Notes (Signed)
Subjective:   Jordan Montoya is a 88 y.o. female who presents for Medicare Annual (Subsequent) preventive examination.  I connected with  Jordan Montoya on 06/02/22 by a audio enabled telemedicine application and verified that I am speaking with the correct person using two identifiers.  Patient Location: Home  Provider Location: Office/Clinic  I discussed the limitations of evaluation and management by telemedicine. The patient expressed understanding and agreed to proceed.    Review of Systems    Defer to PCP Cardiac Risk Factors include: advanced age (>83men, >69 women);diabetes mellitus     Objective:    Today's Vitals   06/02/22 1033  Weight: 169 lb (76.7 kg)  Height: 5\' 6"  (1.676 m)  PainSc: 0-No pain   Body mass index is 27.28 kg/m.     06/02/2022   10:42 AM 08/22/2021   11:27 AM 05/13/2021   10:20 AM 04/23/2020    9:48 AM 04/13/2019   11:42 AM 04/13/2019   10:10 AM 02/28/2019    1:37 AM  Advanced Directives  Does Patient Have a Medical Advance Directive? Yes Yes Yes Yes Yes Yes No  Type of Advance Directive Living will Living will Healthcare Power of [place];Living will Healthcare Power of [place];Living will Healthcare Power of [place];Living will Healthcare Power of [place];Living will   Copy of Healthcare Power of Attorney in Chart?   No - copy requested No - copy requested No - copy requested    Would patient like information on creating a medical advance directive? No - Patient declined      No - Patient declined    Current Medications (verified) Outpatient Encounter Medications as of 06/02/2022  Medication Sig   Acetaminophen (TYLENOL ARTHRITIS EXT RELIEF PO) Take by mouth daily as needed.   ALPRAZolam (XANAX) 0.25 MG tablet Take 1 tablet (0.25 mg total) by mouth daily as needed. for anxiety   Azelastine HCl (ASTEPRO) 0.15 % SOLN Place 1 spray into the nose 2 (two) times daily.   docusate sodium (COLACE) 100 MG capsule Take 200 mg by mouth  2 (two) times daily as needed for mild constipation.   estradiol (ESTRACE VAGINAL) 0.1 MG/GM vaginal cream Apply 0.5mg  (pea-sized amount)  just inside the vaginal introitus with a finger-tip on Monday, Wednesday and Friday nights.   fluticasone (FLONASE) 50 MCG/ACT nasal spray Place 1 spray into both nostrils daily.   JANUVIA 100 MG tablet TAKE 1 TABLET BY MOUTH EVERY DAY   mirabegron ER (MYRBETRIQ) 50 MG TB24 tablet Take 1 tablet (50 mg total) by mouth daily.   naproxen sodium (ALEVE) 220 MG tablet Take 220 mg by mouth.   nitrofurantoin (MACRODANTIN) 50 MG capsule TAKE 1 CAPSULE BY MOUTH EVERY DAY   pregabalin (LYRICA) 100 MG capsule TAKE 1 CAPSULE (100 MG TOTAL) BY MOUTH 3 TIMES A DAY   Probiotic Product (PROBIOTIC-10 PO) Take by mouth.   scopolamine (TRANSDERM SCOP, 1.5 MG,) 1 MG/3DAYS Place 1 patch (1.5 mg total) onto the skin every 3 (three) days.   traMADol (ULTRAM) 50 MG tablet Take by mouth every 12 (twelve) hours as needed.   No facility-administered encounter medications on file as of 06/02/2022.    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/25/2014   Psoriasis 11/25/2014   followed by Dermatology    Shingles 11/2018   Type 2 diabetes mellitus with hyperosmolarity without coma, without long-term current use of insulin (HCC) 09/30/2015   Unspecified septicemia(038.9) (HCC) 02/28/2019   Past Surgical History:  Procedure Laterality Date   ABDOMINAL HYSTERECTOMY     BREAST BIOPSY Left    neg-bx/clip   BREAST CYST EXCISION Left    neg   EYE SURGERY Bilateral    cataracts   PARTIAL HYSTERECTOMY     one ovary remains   Family History  Problem Relation Age of  Onset   Cancer Mother    Heart disease Father    Diabetes Son    Breast cancer Neg Hx    Social History   Socioeconomic History   Marital status: Widowed    Spouse name: Not on file   Number of children: 2   Years of education: Not on file   Highest education level: 11th grade  Occupational History   Occupation: Retired  Tobacco Use   Smoking status: Former    Packs/day: 0.25    Years: 20.00    Total pack years: 5.00    Types: Cigarettes    Quit date: 10/10/1979    Years since quitting: 42.6    Passive exposure: Past   Smokeless tobacco: Never   Tobacco comments:    Smoking cessation not required  Vaping Use   Vaping Use: Never used  Substance and Sexual Activity   Alcohol use: No    Alcohol/week: 0.0 standard drinks of alcohol   Drug use: No   Sexual activity: Not Currently  Other Topics Concern   Not on file  Social History Narrative   Pt lives alone.    Social Determinants of Health   Financial Resource Strain: Low Risk  (06/02/2022)   Overall Financial Resource Strain (CARDIA)    Difficulty of Paying Living Expenses: Not hard at all  Food Insecurity: No Food Insecurity (06/02/2022)   Hunger Vital Sign    Worried About Running Out of Food in the Last Year: Never true    Ran Out of Food in the Last Year: Never true  Transportation Needs: No Transportation Needs (06/02/2022)   PRAPARE - Administrator, Civil Service (Medical): No    Lack of Transportation (Non-Medical): No  Physical Activity: Inactive (06/02/2022)   Exercise Vital Sign    Days of Exercise per Week: 0 days    Minutes of Exercise per Session: 0 min  Stress: No Stress Concern Present (06/02/2022)   [employer] of Occupational Health - Occupational Stress Questionnaire    Feeling of Stress : Only a little  Social Connections: Moderately Isolated (06/02/2022)   Social Connection and Isolation Panel [NHANES]    Frequency of Communication with Friends and Family: More than three times  a week    Frequency of Social Gatherings with Friends and Family: Twice a week    Attends Religious Services: More than 4 times per year    Active Member of [employer] or Organizations: No    Attends Banker Meetings: Never    Marital Status: Widowed    Tobacco Counseling Counseling given: Not Answered Tobacco comments: Smoking cessation not required   Clinical Intake:  Pre-visit preparation completed: Yes  Pain Score: 0-No pain     BMI - recorded: 27.28 Nutritional Status: BMI 25 -29 Overweight Nutritional Risks: None Diabetes: Yes CBG done?: No  Diabetic? Yes.     Information entered by :: Jordan Montoya, [place]   Activities of Daily Living    06/02/2022   10:44 AM 08/10/2021    9:55 AM  In your present state of health, do you have any difficulty performing the following activities:  Hearing? 1 0  Vision? 0 0  Difficulty concentrating or making decisions? 0 0  Walking or climbing stairs? 1 1  Dressing or bathing? 0 0  Doing errands, shopping? 1 1  Preparing Food and eating ? N   Using the Toilet? N   In the past six months, have you accidently leaked urine? N   Do you have problems with loss of bowel control? N   Managing your Medications? N   Managing your Finances? N   Housekeeping or managing your Housekeeping? N     Patient Care Team: Glean Hess, MD as PCP - General (Internal Medicine) Ree Edman, MD (Dermatology) Rubie Maid, MD as Referring Physician (Obstetrics and Gynecology) Donell Beers, MD as Referring Physician (Dermatology) Corey Skains, MD as Consulting Physician (Cardiology) Marlowe Sax, MD as Referring Physician (Rheumatology) Chesley Mires, MD as Consulting Physician (Pulmonary Disease) [facility] (Ophthalmology)  Indicate any recent Medical Services you may have received from other than [facility] providers in the past year (date may be approximate).     Assessment:   This is a routine  wellness examination for Arah.  Hearing/Vision screen Hearing Screening - Comments:: Slight hearing loss. Vision Screening - Comments:: Wears glasses.  Dietary issues and exercise activities discussed: Current Exercise Habits: The patient does not participate in regular exercise at present, Exercise limited by: orthopedic condition(s)   Goals Addressed             This Visit's Progress    DIET - INCREASE WATER INTAKE   On track    Recommend drinking 6-8 glasses of water per day      Depression Screen    06/02/2022   10:41 AM 05/05/2022   10:54 AM 12/08/2021   10:21 AM 08/10/2021    9:54 AM 05/13/2021   10:17 AM 04/09/2021    9:48 AM 01/27/2021   10:25 AM  PHQ 2/9 Scores  PHQ - 2 Score 0 0 0 0 0 0 0  PHQ- 9 Score 0 0 1 1 0 0 1    Fall Risk    06/02/2022   10:44 AM 05/05/2022   10:54 AM 12/08/2021   10:21 AM 08/10/2021    9:55 AM 05/13/2021   10:21 AM  Fall Risk   Falls in the past year? 0 0 0 0 0  Number falls in past yr: 0 0 0 0 0  Injury with Fall? 0 0 0 0 0  Risk for fall due to : No Fall Risks No Fall Risks No Fall Risks No Fall Risks No Fall Risks  Follow up Falls evaluation completed Falls evaluation completed Falls evaluation completed Falls evaluation completed Falls prevention discussed    FALL RISK PREVENTION PERTAINING TO THE HOME:  Any stairs in or around the home? No  If so, are there any without handrails?  N/A Home free of loose throw rugs in walkways, pet beds, electrical cords, etc? Yes  Adequate lighting in your home to reduce risk of falls? Yes   ASSISTIVE DEVICES UTILIZED TO PREVENT FALLS:  Life alert? Yes  Use of a cane, walker or w/c? Yes  Grab bars in the bathroom? Yes  Shower chair or bench in shower?  Yes  Elevated toilet seat or a handicapped toilet? Yes   Cognitive Function:        06/02/2022   10:45 AM 04/23/2020    9:55 AM 10/16/2018   11:55 AM 06/16/2017   10:30 AM  6CIT Screen  What Year? 0 points 0 points 0 points 0 points   What month? 0 points 0 points 0 points 0 points  What time? 0 points 0 points 0 points 0 points  Count back from 20 0 points 0 points 0 points 0 points  Months in reverse 0 points 0 points 0 points 0 points  Repeat phrase 0 points 0 points 0 points 4 points  Total Score 0 points 0 points 0 points 4 points    Immunizations Immunization History  Administered Date(s) Administered   Fluad Quad(high Dose 65+) 05/24/2019, 05/05/2022   Influenza, High Dose Seasonal PF 05/01/2018, 06/03/2020, 05/08/2021   Influenza-Unspecified 05/04/2016, 05/24/2017, 05/04/2018   Moderna Sars-Covid-2 Vaccination 10/01/2019, 10/29/2019, 07/02/2020   Pneumococcal Conjugate-13 06/06/2014   Pneumococcal Polysaccharide-23 06/16/2017   Tdap 08/20/2011   Zoster Recombinat (Shingrix) 12/06/2019, 03/03/2020    TDAP status: Up to date  Flu Vaccine status: Up to date  Pneumococcal vaccine status: Up to date  Covid-19 vaccine status: Completed vaccines  Qualifies for Shingles Vaccine? Yes   Zostavax completed Yes   Shingrix Completed?: Yes  Screening Tests Health Maintenance  Topic Date Due   COVID-19 Vaccine (4 - Moderna series) 06/18/2022 (Originally 08/27/2020)   TETANUS/TDAP  05/06/2023 (Originally 08/19/2021)   MAMMOGRAM  06/03/2022   HEMOGLOBIN A1C  11/04/2022   OPHTHALMOLOGY EXAM  12/09/2022   FOOT EXAM  05/06/2023   Medicare Annual Wellness (AWV)  06/03/2023   Pneumonia Vaccine 3065+ Years old  Completed   INFLUENZA VACCINE  Completed   DEXA SCAN  Completed   Zoster Vaccines- Shingrix  Completed   HPV VACCINES  Aged Out    Health Maintenance  There are no preventive care reminders to display for this patient.   Colorectal cancer screening: No longer required.   Mammogram status: No longer required due to age.  Bone Density status: Completed 06/02/2020. Results reflect: Bone density results: OSTEOPOROSIS. Repeat every 2 years.  Lung Cancer Screening: (Low Dose CT Chest recommended if Age  63-80 years, 30 pack-year currently smoking OR have quit w/in 15years.) does not qualify.   Lung Cancer Screening Referral: N/A  Additional Screening:  Hepatitis C Screening: does not qualify;  Vision Screening: Recommended annual ophthalmology exams for early detection of glaucoma and other disorders of the eye. Is the patient up to date with their annual eye exam?  Yes  Who is the provider or what is the name of the office in which the patient attends annual eye exams? [facility] If pt is not established with a provider, would they like to be referred to a provider to establish care?  N/A .   Dental Screening: Recommended annual dental exams for proper oral hygiene  Community Resource Referral / Chronic Care Management: CRR required this visit?  No   CCM required this visit?  No      Plan:     I have personally reviewed and noted the following in the patient's chart:   Medical and social history Use of alcohol, tobacco or illicit drugs  Current medications and supplements including opioid prescriptions. Patient is not currently taking opioid prescriptions. Functional ability and status Nutritional status Physical activity Advanced directives List of other physicians Hospitalizations, surgeries, and ER  visits in previous 12 months Vitals Screenings to include cognitive, depression, and falls Referrals and appointments  In addition, I have reviewed and discussed with patient certain preventive protocols, quality metrics, and best practice recommendations. A written personalized care plan for preventive services as well as general preventive health recommendations were provided to patient.     Jordan Montoya, CMA   06/02/2022    [name] , Thank you for taking time to come for your Medicare Wellness Visit. I appreciate your ongoing commitment to your health goals. Please review the following plan we discussed and let me know if I can assist you in the  future.   These are the goals we discussed:  Goals      DIET - INCREASE WATER INTAKE     Recommend drinking 6-8 glasses of water per day     Exercise 150 minutes per week (moderate activity)     Recommended to attend [employer]ne[employer]        This is a list of the screening recommended for you and due dates:  Health Maintenance  Topic Date Due   COVID-19 Vaccine (4 - Moderna series) 06/18/2022*   Tetanus Vaccine  05/06/2023*   Mammogram  06/03/2022   Hemoglobin A1C  11/04/2022   Eye exam for diabetics  12/09/2022   Complete foot exam   05/06/2023   Medicare Annual Wellness Visit  06/03/2023   Pneumonia Vaccine  Completed   Flu Shot  Completed   DEXA scan (bone density measurement)  Completed   Zoster (Shingles) Vaccine  Completed   HPV Vaccine  Aged Out  *Topic was postponed. The date shown is not the original due date.     Nurse Notes: Number given to schedule Mammogram.

## 2022-06-14 ENCOUNTER — Other Ambulatory Visit: Payer: Self-pay | Admitting: Internal Medicine

## 2022-06-14 DIAGNOSIS — F411 Generalized anxiety disorder: Secondary | ICD-10-CM

## 2022-06-15 NOTE — Telephone Encounter (Signed)
Requested medication (s) are due for refill today - yes  Requested medication (s) are on the active medication list -yes  Future visit scheduled -no  Last refill: 11/09/21 #30 3RF  Notes to clinic: non delegated Rx  Requested Prescriptions  Pending Prescriptions Disp Refills   ALPRAZolam (XANAX) 0.25 MG tablet [Pharmacy Med Name: ALPRAZOLAM 0.25 MG TABLET] 30 tablet     Sig: Take 1 tablet (0.25 mg total) by mouth daily as needed. for anxiety     Not Delegated - Psychiatry: Anxiolytics/Hypnotics 2 Failed - 06/14/2022  9:32 AM      Failed - This refill cannot be delegated      Failed - Urine Drug Screen completed in last 360 days      Passed - Patient is not pregnant      Passed - Valid encounter within last 6 months    Recent Outpatient Visits           1 month ago Annual physical exam   [place] [facility] at [facility], Nyoka Cowden, MD   6 months ago Type II diabetes mellitus with complication [facility])   [place] [facility] at [facility], Nyoka Cowden, MD   7 months ago Spinal stenosis of lumbar region at multiple levels   [facility] at [facility], Nyoka Cowden, MD   10 months ago Hyperlipidemia associated with type 2 diabetes mellitus [facility])   [place] [facility] at [facility], Nyoka Cowden, MD   1 year ago Annual physical exam   [facility] at [facility], Nyoka Cowden, MD       Future Appointments             In 8 months Richardo Hanks Laurette Schimke, MD [facility] [place]               Requested Prescriptions  Pending Prescriptions Disp Refills   ALPRAZolam (XANAX) 0.25 MG tablet [Pharmacy Med Name: ALPRAZOLAM 0.25 MG TABLET] 30 tablet     Sig: Take 1 tablet (0.25 mg total) by mouth daily as needed. for anxiety     Not Delegated - Psychiatry: Anxiolytics/Hypnotics 2 Failed -  06/14/2022  9:32 AM      Failed - This refill cannot be delegated      Failed - Urine Drug Screen completed in last 360 days      Passed - Patient is not pregnant      Passed - Valid encounter within last 6 months    Recent Outpatient Visits           1 month ago Annual physical exam   [place] [facility] at [facility], Nyoka Cowden, MD   6 months ago Type II diabetes mellitus with complication [facility])   [place] [facility] at [facility], Nyoka Cowden, MD   7 months ago Spinal stenosis of lumbar region at multiple levels   [facility] Care and Sports Medicine at [facility], Nyoka Cowden, MD   10 months ago Hyperlipidemia associated with type 2 diabetes mellitus [facility])   [place] [facility] and [facility] Medicine at [facility], Nyoka Cowden, MD   1 year ago Annual physical exam   [facility] at [facility], Nyoka Cowden, MD  Future Appointments             In 8 months Sninsky, Laurette Schimke, MD [facility] [place]

## 2022-06-21 DIAGNOSIS — M25552 Pain in left hip: Secondary | ICD-10-CM | POA: Diagnosis not present

## 2022-06-21 DIAGNOSIS — Z79899 Other long term (current) drug therapy: Secondary | ICD-10-CM | POA: Diagnosis not present

## 2022-06-21 DIAGNOSIS — H61001 Unspecified perichondritis of right external ear: Secondary | ICD-10-CM | POA: Diagnosis not present

## 2022-06-21 DIAGNOSIS — E119 Type 2 diabetes mellitus without complications: Secondary | ICD-10-CM | POA: Diagnosis not present

## 2022-06-21 DIAGNOSIS — G8929 Other chronic pain: Secondary | ICD-10-CM | POA: Diagnosis not present

## 2022-06-21 DIAGNOSIS — L400 Psoriasis vulgaris: Secondary | ICD-10-CM | POA: Diagnosis not present

## 2022-06-22 ENCOUNTER — Telehealth: Payer: Self-pay | Admitting: Internal Medicine

## 2022-06-22 NOTE — Telephone Encounter (Signed)
FYI  KP

## 2022-06-22 NOTE — Telephone Encounter (Signed)
Copied from CRM [id number]. Topic: General - Other >> Jun 22, 2022  8:43 AM [place] B wrote: Reason for CRM: Patient called in just wanted Dr Judithann Graves to know that her hip injecitons makes her BS go up, but not in the danger zone, just at 208

## 2022-07-05 DIAGNOSIS — M25552 Pain in left hip: Secondary | ICD-10-CM | POA: Diagnosis not present

## 2022-07-05 DIAGNOSIS — M5442 Lumbago with sciatica, left side: Secondary | ICD-10-CM | POA: Diagnosis not present

## 2022-07-05 DIAGNOSIS — G8929 Other chronic pain: Secondary | ICD-10-CM | POA: Diagnosis not present

## 2022-07-05 DIAGNOSIS — M48062 Spinal stenosis, lumbar region with neurogenic claudication: Secondary | ICD-10-CM | POA: Diagnosis not present

## 2022-07-05 DIAGNOSIS — M5441 Lumbago with sciatica, right side: Secondary | ICD-10-CM | POA: Diagnosis not present

## 2022-08-09 ENCOUNTER — Ambulatory Visit
Admission: RE | Admit: 2022-08-09 | Discharge: 2022-08-09 | Disposition: A | Discharge status: Home / Self Care | Payer: Medicare PPO | Source: Ambulatory Visit | Attending: Internal Medicine | Admitting: Internal Medicine

## 2022-08-09 DIAGNOSIS — Z1231 Encounter for screening mammogram for malignant neoplasm of breast: Secondary | ICD-10-CM | POA: Insufficient documentation

## 2022-08-11 ENCOUNTER — Telehealth: Payer: Self-pay | Admitting: Internal Medicine

## 2022-08-11 NOTE — Telephone Encounter (Signed)
Requested medication (s) are due for refill today: Yes  Requested medication (s) are on the active medication list: Yes  Last refill:  05/05/22  Future visit scheduled: No  Notes to clinic:  Historical provider.    Requested Prescriptions  Pending Prescriptions Disp Refills   traMADol (ULTRAM) 50 MG tablet 30 tablet     Sig: Take by mouth every 12 (twelve) hours as needed.     Not Delegated - Analgesics:  Opioid Agonists Failed - 08/11/2022 10:20 AM      Failed - This refill cannot be delegated      Failed - Urine Drug Screen completed in last 360 days      Passed - Valid encounter within last 3 months    Recent Outpatient Visits           3 months ago Annual physical exam   [place] [facility] at [facility], Jesse Sans, MD   8 months ago Type II diabetes mellitus with complication [facility])   [place] [facility] at [facility], Jesse Sans, MD   9 months ago Spinal stenosis of lumbar region at multiple levels   [facility] and [facility] Medicine at [facility], Jesse Sans, MD   1 year ago Hyperlipidemia associated with type 2 diabetes mellitus [facility])   [place] [facility] Medicine at [facility], Jesse Sans, MD   1 year ago Annual physical exam   [facility] and [facility] at [facility], Jesse Sans, MD       Future Appointments             In 6 months Diamantina [place], Herbert Seta, MD [place] [facility] [place]

## 2022-08-11 NOTE — Telephone Encounter (Signed)
Copied from [place] [id number]. Topic: General - Other >> Aug 11, 2022  8:57 AM Everette C wrote: Reason for CRM: Medication Refill - Medication: traMADol (ULTRAM) 50 MG tablet [[id number]]  Has the patient contacted their pharmacy? No. (Agent: If no, request that the patient contact the pharmacy for the refill. If patient does not wish to contact the pharmacy document the reason why and proceed with request.) (Agent: If yes, when and what did the pharmacy advise?)  Preferred Pharmacy (with phone number or street name): [id number] [place], [address] [place] [address] Phone: [phone] Fax: [phone] Hours: Not open 24 hours   Has the patient been seen for an appointment in the last year OR does the patient have an upcoming appointment? Yes.    Agent: Please be advised that RX refills may take up to 3 business days. We ask that you follow-up with your pharmacy.

## 2022-08-11 NOTE — Telephone Encounter (Signed)
Please review. Last office visit 06/02/2022.  KP

## 2022-08-13 NOTE — Telephone Encounter (Signed)
Please advise 

## 2022-08-13 NOTE — Telephone Encounter (Signed)
Pt called and stated Dr. Sharon Mt (pain Management) prescribed them last but now she just gives her injections now and wont refill them / pt stated she takes it when needed / please advise / pt asked if Dr. Carolin Coy would refill / she stated she does need them and asked if Dr. Army Melia or her nurse can call her today

## 2022-08-16 ENCOUNTER — Ambulatory Visit: Payer: Self-pay

## 2022-08-16 NOTE — Telephone Encounter (Signed)
  Chief Complaint: dizziness Symptoms: dizziness when standing or turning head too quickly  Frequency: happens at times, not constant and unsure how long been going on Pertinent Negatives: NA Disposition: [] ED /[] Urgent Care (no appt availability in office) / [x] Appointment(In office/virtual)/ []  [place] [facility]/ [] Home Care/ [] Refused Recommended Disposition /[] [facility]/ []  Follow-up with PCP Additional Notes: pt states that she felt like she got up too quickly to write down her appt info. Has been happening at times tho when turns head too quick in bed or stand up too fast. Gave care advice and offered sooner appt, at first pt refused but was able to talk her into scheduling tomorrow at 1120 with Dr. Army Melia. Pt also being seen for pain management and refill on Tramadol.   Summary: dizziness   While scheduling appt / pt mentioned she was dizzy while trying to get a pen to write down appt info / please advise         Reason for Disposition  [1] MILD dizziness (e.g., walking normally) AND [2] has NOT been evaluated by doctor (or NP/PA) for this  (Exception: Dizziness caused by heat exposure, sudden standing, or poor fluid intake.)  Answer Assessment - Initial Assessment Questions 1. DESCRIPTION: "Describe your dizziness."     Got dizzy when standing up  2. LIGHTHEADED: "Do you feel lightheaded?" (e.g., somewhat faint, woozy, weak upon standing)     Getting woozy  4. SEVERITY: "How bad is it?"  "Do you feel like you are going to faint?" "Can you stand and walk?"   - MILD: Feels slightly dizzy, but walking normally.   - MODERATE: Feels unsteady when walking, but not falling; interferes with normal activities (e.g., school, work).   - SEVERE: Unable to walk without falling, or requires assistance to walk without falling; feels like passing out now.      Moderate  5. ONSET:  "When did the dizziness begin?"     Today  6. AGGRAVATING FACTORS: "Does anything make it  worse?" (e.g., standing, change in head position)     Change in head position or standing up quickly  10. OTHER SYMPTOMS: "Do you have any other symptoms?" (e.g., fever, chest pain, vomiting, diarrhea, bleeding)       no  Protocols used: Dizziness - Lightheadedness-A-AH

## 2022-08-17 ENCOUNTER — Ambulatory Visit: Payer: Medicare PPO | Admitting: Internal Medicine

## 2022-08-17 ENCOUNTER — Other Ambulatory Visit: Payer: Self-pay | Admitting: Internal Medicine

## 2022-08-17 DIAGNOSIS — G894 Chronic pain syndrome: Secondary | ICD-10-CM

## 2022-08-17 MED ORDER — TRAMADOL HCL 50 MG PO TABS: 50.0000 mg | ORAL_TABLET | Freq: Two times a day (BID) | ORAL | 0 refills | Status: DC | PRN

## 2022-08-22 ENCOUNTER — Other Ambulatory Visit: Payer: Self-pay | Admitting: Internal Medicine

## 2022-08-25 ENCOUNTER — Ambulatory Visit: Payer: Medicare PPO | Admitting: Internal Medicine

## 2022-09-04 ENCOUNTER — Other Ambulatory Visit: Payer: Self-pay | Admitting: Internal Medicine

## 2022-09-07 ENCOUNTER — Ambulatory Visit: Payer: Medicare PPO | Admitting: Internal Medicine

## 2022-11-18 ENCOUNTER — Other Ambulatory Visit: Payer: Self-pay | Admitting: Internal Medicine

## 2022-11-18 NOTE — Telephone Encounter (Signed)
Please schedule pt an appt.  KP

## 2022-12-05 ENCOUNTER — Ambulatory Visit
Admission: EM | Admit: 2022-12-05 | Discharge: 2022-12-05 | Disposition: A | Discharge status: Home / Self Care | Payer: Medicare PPO | Attending: Family Medicine | Admitting: Family Medicine

## 2022-12-05 DIAGNOSIS — K5792 Diverticulitis of intestine, part unspecified, without perforation or abscess without bleeding: Secondary | ICD-10-CM | POA: Diagnosis not present

## 2022-12-05 DIAGNOSIS — R0902 Hypoxemia: Secondary | ICD-10-CM

## 2022-12-05 DIAGNOSIS — R9431 Abnormal electrocardiogram [ECG] [EKG]: Secondary | ICD-10-CM | POA: Diagnosis not present

## 2022-12-05 DIAGNOSIS — Z1152 Encounter for screening for COVID-19: Secondary | ICD-10-CM | POA: Diagnosis not present

## 2022-12-05 DIAGNOSIS — R1032 Left lower quadrant pain: Secondary | ICD-10-CM | POA: Diagnosis not present

## 2022-12-05 DIAGNOSIS — K8020 Calculus of gallbladder without cholecystitis without obstruction: Secondary | ICD-10-CM | POA: Diagnosis not present

## 2022-12-05 DIAGNOSIS — I10 Essential (primary) hypertension: Secondary | ICD-10-CM | POA: Diagnosis not present

## 2022-12-05 DIAGNOSIS — R4182 Altered mental status, unspecified: Secondary | ICD-10-CM | POA: Diagnosis not present

## 2022-12-05 DIAGNOSIS — L4050 Arthropathic psoriasis, unspecified: Secondary | ICD-10-CM | POA: Diagnosis not present

## 2022-12-05 DIAGNOSIS — R1030 Lower abdominal pain, unspecified: Secondary | ICD-10-CM

## 2022-12-05 DIAGNOSIS — E119 Type 2 diabetes mellitus without complications: Secondary | ICD-10-CM | POA: Diagnosis not present

## 2022-12-05 DIAGNOSIS — R0600 Dyspnea, unspecified: Secondary | ICD-10-CM | POA: Diagnosis not present

## 2022-12-05 DIAGNOSIS — R079 Chest pain, unspecified: Secondary | ICD-10-CM | POA: Diagnosis not present

## 2022-12-05 NOTE — ED Notes (Signed)
Patient is being discharged from the Urgent Care and sent to the [facility] Emergency Department via private vehicle . Per Dr. Rachael Darby, patient is in need of higher level of care due to weakness. Patient is aware and verbalizes understanding of plan of care.  Vitals:   12/05/22 1048 12/05/22 1102  BP: 123/78   Pulse: (!) 116 (!) 113  Resp: 16   Temp: 99.1 F (37.3 C)   SpO2: (!) 89% 92%

## 2022-12-05 NOTE — Discharge Instructions (Addendum)
Your oxygen level was 89-92% with an elevated temperature and abdominal pain.  Your son reports confusion.    You have been advised to follow up immediately in the emergency department for concerning signs or symptoms as discussed during your visit. If you declined EMS transport, please have a family member take you directly to the ED at this time. Do not delay.   Based on concerns about condition, if you do not follow up in the ED, you may risk poor outcomes including worsening of condition, delayed treatment and potentially life threatening issues. If you have declined to go to the ED at this time, you should call your PCP immediately to set up a follow up appointment.

## 2022-12-05 NOTE — ED Provider Notes (Signed)
[facility]    CSN: [id number] Arrival date & time: 12/05/22  1009      History   Chief Complaint No chief complaint on file.   HPI Jordan Montoya is a 89 y.o. female.   HPI   Jordan Montoya brought in by son for altered mental status, decreased appetite, fever and abdominal pain for the past couple of days.  Patient has not been eating food.  She states she just feels bad.  He is having some lower abdominal pain.  Son is concerned that she may have a urinary tract infection.  She took some Tylenol prior to arrival.  She does not feel like she has any energy.  She has a new tremor in her hand when she tries to lift up things.      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   Psoriasis 11/25/2014   followed by Dermatology    Shingles 11/2018   Type 2 diabetes mellitus with hyperosmolarity without coma, without long-term current use of insulin (HCC) 09/30/2015   Unspecified septicemia(038.9) (HCC) 02/28/2019    Patient Active Problem List   Diagnosis Date Noted   Psoriatic arthritis (HCC) 08/10/2021   Benign essential hypertension 04/09/2021   PSVT (paroxysmal supraventricular tachycardia) 01/27/2021   Chronic pain of right knee 10/23/2020   Osteoporosis 06/04/2020   Chronic pain syndrome 06/11/2019   Post herpetic neuralgia 03/20/2019   Spinal stenosis of lumbar region at multiple levels 03/16/2019   Anxiety disorder 11/16/2018   Primary osteoarthritis of both hands 08/16/2018   Arthralgia of multiple sites 05/18/2018   Pernicious anemia 12/29/2017   Type II diabetes mellitus with complication (HCC) 09/30/2015   Environmental and seasonal allergies 05/30/2015   Hip pain, chronic 01/27/2015   Avitaminosis D  11/25/2014   Psoriasis 11/25/2014   Hyperlipidemia associated with type 2 diabetes mellitus (HCC) 08/20/2011    Past Surgical History:  Procedure Laterality Date   ABDOMINAL HYSTERECTOMY     BREAST BIOPSY Left    neg-bx/clip   BREAST CYST EXCISION Left    neg   EYE SURGERY Bilateral    cataracts   PARTIAL HYSTERECTOMY     one ovary remains    OB History     Gravida  3   Para  3   Term  3   Preterm      AB      Living         SAB      IAB      Ectopic      Multiple      Live Births  1            Home Medications    Prior to Admission medications   Medication Sig Start Date End Date Taking? Authorizing Provider  Acetaminophen (TYLENOL ARTHRITIS EXT RELIEF PO) Take by mouth daily as needed.    [provider]  ALPRAZolam (XANAX) 0.25 MG tablet TAKE 1 TABLET (0.25 MG TOTAL) BY MOUTH DAILY AS NEEDED. FOR ANXIETY 06/15/22   Reubin Milan, MD  Azelastine HCl (ASTEPRO) 0.15 % SOLN Place 1 spray into the nose 2 (two) times daily. 08/18/21   Coralyn Helling, MD  docusate sodium (COLACE) 100 MG  capsule Take 200 mg by mouth 2 (two) times daily as needed for mild constipation.    [provider]  estradiol (ESTRACE VAGINAL) 0.1 MG/GM vaginal cream Apply 0.5mg  (pea-sized amount)  just inside the vaginal introitus with a finger-tip on Monday, Wednesday and Friday nights. 09/08/21   McGowan, Carollee Herter A, PA-C  fluticasone (FLONASE) 50 MCG/ACT nasal spray SPRAY 1 SPRAY INTO BOTH NOSTRILS DAILY. 09/06/22   Reubin Milan, MD  JANUVIA 100 MG tablet TAKE 1 TABLET BY MOUTH EVERY DAY 11/18/22   Reubin Milan, MD  mirabegron ER (MYRBETRIQ) 50 MG TB24 tablet Take 1 tablet (50 mg total) by mouth daily. 04/14/22   Sondra Come, MD  naproxen sodium (ALEVE) 220 MG tablet Take 220 mg by mouth.    [provider]  nitrofurantoin (MACRODANTIN) 50 MG capsule TAKE 1 CAPSULE BY MOUTH EVERY DAY 01/14/22   Sondra Come, MD  pregabalin (LYRICA) 100 MG  capsule TAKE 1 CAPSULE (100 MG TOTAL) BY MOUTH 3 TIMES A DAY 05/31/22   Reubin Milan, MD  Probiotic Product (PROBIOTIC-10 PO) Take by mouth.    [provider]  scopolamine (TRANSDERM SCOP, 1.5 MG,) 1 MG/3DAYS Place 1 patch (1.5 mg total) onto the skin every 3 (three) days. 12/08/21   Reubin Milan, MD  traMADol (ULTRAM) 50 MG tablet Take 1 tablet (50 mg total) by mouth every 12 (twelve) hours as needed. 08/17/22   Reubin Milan, MD    Family History Family History  Problem Relation Age of Onset   Cancer Mother    Heart disease Father    Diabetes Son    Breast cancer Neg Hx     Social History Social History   Tobacco Use   Smoking status: Former    Packs/day: 0.25    Years: 20.00    Additional pack years: 0.00    Total pack years: 5.00    Types: Cigarettes    Quit date: 10/10/1979    Years since quitting: 43.1    Passive exposure: Past   Smokeless tobacco: Never   Tobacco comments:    Smoking cessation not required  Vaping Use   Vaping Use: Never used  Substance Use Topics   Alcohol use: No    Alcohol/week: 0.0 standard drinks of alcohol   Drug use: No     Allergies   Metformin and Macrobid [nitrofurantoin]   Review of Systems Review of Systems: negative unless otherwise stated in HPI.      Physical Exam Triage Vital Signs ED Triage Vitals  Enc Vitals Group     BP 12/05/22 1043 123/78     Pulse Rate 12/05/22 1043 (!) 116     Resp 12/05/22 1043 16     Temp 12/05/22 1043 99.1 F (37.3 C)     Temp Source 12/05/22 1043 Oral     SpO2 12/05/22 1048 (!) 89 %     Weight --      Height --      Head Circumference --      Peak Flow --      Pain Score 12/05/22 1049 6     Pain Loc --      Pain Edu? --      Excl. in GC? --    No data found.  Updated Vital Signs BP 123/78 (BP Location: Right Arm)   Pulse (!) 113   Temp 99.1 F (37.3 C) (Oral)   Resp 16   SpO2 92%  Visual Acuity Right Eye Distance:   Left Eye Distance:   Bilateral  Distance:    Right Eye Near:   Left Eye Near:    Bilateral Near:     Physical Exam GEN:     alert, ill-appearing elderly female and no distress    HENT:  mucus membranes moist, oropharyngeal without lesions or erythema,  nares patent, no nasal discharge  EYES:   pupils equal and reactive, EOM intact NECK:  supple, normal ROM baseline  RESP:  clear to auscultation bilaterally, no increased work of breathing  CVS:   regular rhythm, tachycardic ABD:  soft, generalized tenderness most pronounced in the left lower quadrant; bowel sounds present; NEURO:  speech normal, alert, tremor in right UE while holding cup Skin:   warm and dry, no rash, normal skin turgor Psych: Normal affect, appropriate speech and behavior      UC Treatments / Results  Labs (all labs ordered are listed, but only abnormal results are displayed) Labs Reviewed - No data to display   EKG  If EKG performed, see my interpretation in the MDM section  Radiology No results found.   Procedures Procedures (including critical care time)  Medications Ordered in UC Medications - No data to display  Initial Impression / Assessment and Plan / UC Course  I have reviewed the triage vital signs and the nursing notes.  Pertinent labs & imaging results that were available during my care of the patient were reviewed by me and considered in my medical decision making (see chart for details).       Patient is a 89 y.o. female  who presents for abdominal pain, fever and confusion for the past couple of days.  Overall patient is ill -appearing.  She has an elevated temperature of 99.1 though she recently took Tylenol.  She is tachycardic and hypoxic to 89% on room air.  Repeat oxygen saturation was 92%.     On chart review, recent B12 was 155.  This may be the cause of her new tremor.  Her TSH was normal.  Her diabetes appears to be controlled with last A1c 7.0.  October 2023.  States he is in July 2020 showed no acute  intracranial process.    On exam, she has tenderness to the left lower quadrant, a new tremor in her right upper extremity and is tachycardic.  Ordered CBC, CMP and UA however workup truncated here as patient needs a higher level of care.  I am concerned she may be septic.   Recommended ED evaluation via EMS however patient does not want to go to [facility].  Her son will drive her to [facility] and patient is agreeable to this. ED and return precautions given and patient voiced understanding. Discussed MDM, treatment plan and plan for follow-up with patient who agrees with plan.    Final Clinical Impressions(s) / UC Diagnoses   Final diagnoses:  Lower abdominal pain  Altered mental status, unspecified altered mental status type  Hypoxia     Discharge Instructions      Your oxygen level was 89-92% with an elevated temperature and abdominal pain.  Your son reports confusion.    You have been advised to follow up immediately in the emergency department for concerning signs or symptoms as discussed during your visit. If you declined EMS transport, please have a family member take you directly to the ED at this time. Do not delay.   Based on concerns about condition,  if you do not follow up in the ED, you may risk poor outcomes including worsening of condition, delayed treatment and potentially life threatening issues. If you have declined to go to the ED at this time, you should call your PCP immediately to set up a follow up appointment.     ED Prescriptions   None    PDMP not reviewed this encounter.   Katha Cabal, DO 12/05/22 1119

## 2022-12-05 NOTE — ED Triage Notes (Addendum)
Patient presents with son, states decreased energy, decreased appetite, nausea and confusion x 3 days.  Son reports dark urine. Son reports blood sugars was 311 yesterday, 200 range. Pt reports pain on left side.

## 2022-12-09 ENCOUNTER — Encounter: Payer: Self-pay | Admitting: Internal Medicine

## 2022-12-09 ENCOUNTER — Ambulatory Visit: Payer: Medicare PPO | Admitting: Internal Medicine

## 2022-12-09 VITALS — BP 120/62 | HR 90 | Ht 66.0 in | Wt 160.0 lb

## 2022-12-09 DIAGNOSIS — E785 Hyperlipidemia, unspecified: Secondary | ICD-10-CM

## 2022-12-09 DIAGNOSIS — I10 Essential (primary) hypertension: Secondary | ICD-10-CM

## 2022-12-09 DIAGNOSIS — G894 Chronic pain syndrome: Secondary | ICD-10-CM | POA: Diagnosis not present

## 2022-12-09 DIAGNOSIS — E1169 Type 2 diabetes mellitus with other specified complication: Secondary | ICD-10-CM

## 2022-12-09 DIAGNOSIS — E118 Type 2 diabetes mellitus with unspecified complications: Secondary | ICD-10-CM | POA: Diagnosis not present

## 2022-12-09 DIAGNOSIS — K5732 Diverticulitis of large intestine without perforation or abscess without bleeding: Secondary | ICD-10-CM | POA: Diagnosis not present

## 2022-12-09 DIAGNOSIS — Z7984 Long term (current) use of oral hypoglycemic drugs: Secondary | ICD-10-CM | POA: Diagnosis not present

## 2022-12-09 LAB — POCT GLYCOSYLATED HEMOGLOBIN (HGB A1C): Hemoglobin A1C: 6.9 % — AB (ref 4.0–5.6)

## 2022-12-09 MED ORDER — TRAMADOL HCL 50 MG PO TABS
50.0000 mg | ORAL_TABLET | Freq: Two times a day (BID) | ORAL | 0 refills | Status: DC | PRN
Start: 2022-12-09 — End: 2023-08-19

## 2022-12-09 NOTE — Patient Instructions (Signed)
Try ti drink a protein drink at least twice a day.  Also try to eat more regular food.  Do not worry about the sugar in the protein drinks.  Your diabetes is doing very well.

## 2022-12-09 NOTE — Assessment & Plan Note (Addendum)
BP controlled with lifestyle changes

## 2022-12-09 NOTE — Assessment & Plan Note (Signed)
Secondary to both PHN and lumbar spinal stenosis On Lyrica bid and occasional use of Tramdol

## 2022-12-09 NOTE — Assessment & Plan Note (Addendum)
Blood sugars stable without hypoglycemic symptoms or events. Currently being treated with Januvia. Lab Results  Component Value Date   HGBA1C 7.0 (H) 05/05/2022  Eye exam scheduled for June A1C today 6.9

## 2022-12-09 NOTE — Progress Notes (Signed)
Date:  12/09/2022   Name:  Jordan Montoya   DOB:  1934/02/21   MRN:  [id number]   Chief Complaint: Diabetes  Abdominal Pain This is a new problem. The current episode started in the past 7 days. The problem has been gradually improving (seen in ER for diverticulitis; treated with Augmentin). The pain is located in the LLQ. The pain is mild. The quality of the pain is a sensation of fullness. The abdominal pain does not radiate. Associated symptoms include anorexia and weight loss. Pertinent negatives include no constipation, diarrhea, fever, nausea or vomiting. Treatments tried: on Augmentin for 10 days.  Diabetes She presents for her follow-up diabetic visit. She has type 2 diabetes mellitus. Her disease course has been stable (glucoses around 140). Hypoglycemia symptoms include nervousness/anxiousness. Associated symptoms include fatigue and weight loss. Pertinent negatives for diabetes include no chest pain. Symptoms are stable.    Lab Results  Component Value Date   NA 141 05/05/2022   K 4.6 05/05/2022   CO2 22 05/05/2022   GLUCOSE 126 (H) 05/05/2022   BUN 18 05/05/2022   CREATININE 0.78 05/05/2022   CALCIUM 9.6 05/05/2022   EGFR 74 05/05/2022   GFRNONAA 59 (L) 04/14/2020   Lab Results  Component Value Date   CHOL 198 05/05/2022   HDL 60 05/05/2022   LDLCALC 105 (H) 05/05/2022   TRIG 190 (H) 05/05/2022   CHOLHDL 3.3 05/05/2022   Lab Results  Component Value Date   TSH 2.830 05/05/2022   Lab Results  Component Value Date   HGBA1C 6.9 (A) 12/09/2022   Lab Results  Component Value Date   WBC 4.8 05/05/2022   HGB 12.2 05/05/2022   HCT 36.2 05/05/2022   MCV 87 05/05/2022   PLT 243 05/05/2022   Lab Results  Component Value Date   ALT 19 05/05/2022   AST 22 05/05/2022   ALKPHOS 87 05/05/2022   BILITOT 0.3 05/05/2022   Lab Results  Component Value Date   VD25OH 19.4 (L) 05/05/2022     Review of Systems  Constitutional:  Positive for fatigue,  unexpected weight change and weight loss. Negative for chills, diaphoresis and fever.  Respiratory:  Negative for chest tightness and shortness of breath.   Cardiovascular:  Negative for chest pain and leg swelling.  Gastrointestinal:  Positive for abdominal pain and anorexia. Negative for constipation, diarrhea, nausea and vomiting.  Musculoskeletal:  Positive for back pain (chronic PHN).  Psychiatric/Behavioral:  Negative for dysphoric mood and sleep disturbance. The patient is nervous/anxious.     Patient Active Problem List   Diagnosis Date Noted   Psoriatic arthritis (HCC) 08/10/2021   Benign essential hypertension 04/09/2021   PSVT (paroxysmal supraventricular tachycardia) 01/27/2021   Chronic pain of right knee 10/23/2020   Osteoporosis 06/04/2020   Chronic pain syndrome 06/11/2019   Post herpetic neuralgia 03/20/2019   Spinal stenosis of lumbar region at multiple levels 03/16/2019   Anxiety disorder 11/16/2018   Primary osteoarthritis of both hands 08/16/2018   Arthralgia of multiple sites 05/18/2018   Pernicious anemia 12/29/2017   Type II diabetes mellitus with complication (HCC) 09/30/2015   Environmental and seasonal allergies 05/30/2015   Hip pain, chronic 01/27/2015   Avitaminosis D 11/25/2014   Psoriasis 11/25/2014   Hyperlipidemia associated with type 2 diabetes mellitus (HCC) 08/20/2011    Allergies  Allergen Reactions   Metformin Nausea Only   Macrobid [Nitrofurantoin] Rash    Past Surgical History:  Procedure Laterality Date   ABDOMINAL  HYSTERECTOMY     BREAST BIOPSY Left    neg-bx/clip   BREAST CYST EXCISION Left    neg   EYE SURGERY Bilateral    cataracts   PARTIAL HYSTERECTOMY     one ovary remains    Social History   Tobacco Use   Smoking status: Former    Packs/day: 0.25    Years: 20.00    Additional pack years: 0.00    Total pack years: 5.00    Types: Cigarettes    Quit date: 10/10/1979    Years since quitting: 43.1    Passive  exposure: Past   Smokeless tobacco: Never   Tobacco comments:    Smoking cessation not required  Vaping Use   Vaping Use: Never used  Substance Use Topics   Alcohol use: No    Alcohol/week: 0.0 standard drinks of alcohol   Drug use: No     Medication list has been reviewed and updated.  Current Meds  Medication Sig   Acetaminophen (TYLENOL ARTHRITIS EXT RELIEF PO) Take by mouth daily as needed.   ALPRAZolam (XANAX) 0.25 MG tablet TAKE 1 TABLET (0.25 MG TOTAL) BY MOUTH DAILY AS NEEDED. FOR ANXIETY   Azelastine HCl (ASTEPRO) 0.15 % SOLN Place 1 spray into the nose 2 (two) times daily.   docusate sodium (COLACE) 100 MG capsule Take 200 mg by mouth 2 (two) times daily as needed for mild constipation.   estradiol (ESTRACE VAGINAL) 0.1 MG/GM vaginal cream Apply 0.5mg  (pea-sized amount)  just inside the vaginal introitus with a finger-tip on Monday, Wednesday and Friday nights.   JANUVIA 100 MG tablet TAKE 1 TABLET BY MOUTH EVERY DAY   mirabegron ER (MYRBETRIQ) 50 MG TB24 tablet Take 1 tablet (50 mg total) by mouth daily.   naproxen sodium (ALEVE) 220 MG tablet Take 220 mg by mouth.   nitrofurantoin (MACRODANTIN) 50 MG capsule TAKE 1 CAPSULE BY MOUTH EVERY DAY   pregabalin (LYRICA) 100 MG capsule TAKE 1 CAPSULE (100 MG TOTAL) BY MOUTH 3 TIMES A DAY (Patient taking differently: Take 100 mg by mouth 2 (two) times daily.)   Probiotic Product (PROBIOTIC-10 PO) Take by mouth.   scopolamine (TRANSDERM SCOP, 1.5 MG,) 1 MG/3DAYS Place 1 patch (1.5 mg total) onto the skin every 3 (three) days.   [DISCONTINUED] fluticasone (FLONASE) 50 MCG/ACT nasal spray SPRAY 1 SPRAY INTO BOTH NOSTRILS DAILY.   [DISCONTINUED] traMADol (ULTRAM) 50 MG tablet Take 1 tablet (50 mg total) by mouth every 12 (twelve) hours as needed.       05/05/2022   10:55 AM 12/08/2021   10:21 AM 08/10/2021    9:54 AM 04/09/2021    9:48 AM  GAD 7 : Generalized Anxiety Score  Nervous, Anxious, on Edge 0 0 0 0  Control/stop worrying 0 0  0 0  Worry too much - different things 0 0 0 0  Trouble relaxing 0 1 0 0  Restless 0 1 0 0  Easily annoyed or irritable 0 0 0 0  Afraid - awful might happen 0 0 0 0  Total GAD 7 Score 0 2 0 0  Anxiety Difficulty Not difficult at all Not difficult at all Not difficult at all        06/02/2022   10:41 AM 05/05/2022   10:54 AM 12/08/2021   10:21 AM  Depression screen PHQ 2/9  Decreased Interest 0 0 0  Down, Depressed, Hopeless 0 0 0  PHQ - 2 Score 0 0 0  Altered sleeping  0 0 0  Tired, decreased energy 0 0 1  Change in appetite 0 0 0  Feeling bad or failure about yourself  0 0 0  Trouble concentrating 0 0 0  Moving slowly or fidgety/restless 0 0 0  Suicidal thoughts 0 0 0  PHQ-9 Score 0 0 1  Difficult doing work/chores Not difficult at all Not difficult at all Not difficult at all    BP Readings from Last 3 Encounters:  12/09/22 120/62  12/05/22 123/78  05/05/22 122/62    Physical Exam Constitutional:      Appearance: Normal appearance.  Cardiovascular:     Rate and Rhythm: Normal rate and regular rhythm.  Pulmonary:     Effort: Pulmonary effort is normal.     Breath sounds: No wheezing or rhonchi.  Abdominal:     General: Abdomen is flat.     Palpations: Abdomen is soft.     Tenderness: There is no abdominal tenderness.  Musculoskeletal:     Cervical back: Normal range of motion.     Right lower leg: No edema.     Left lower leg: No edema.  Lymphadenopathy:     Cervical: No cervical adenopathy.  Neurological:     General: No focal deficit present.     Mental Status: She is alert and oriented to person, place, and time.     Motor: Weakness present.     Gait: Gait abnormal (in wheel chair).  Psychiatric:        Mood and Affect: Mood normal.        Behavior: Behavior normal.     Wt Readings from Last 3 Encounters:  12/09/22 160 lb (72.6 kg)  06/02/22 169 lb (76.7 kg)  05/05/22 169 lb (76.7 kg)    BP 120/62   Pulse 90   Ht 5\' 6"  (1.676 m)   Wt 160 lb  (72.6 kg)   SpO2 94%   BMI 25.82 kg/m   Assessment and Plan:  Problem List Items Addressed This Visit       Cardiovascular and Mediastinum   Benign essential hypertension (Chronic)    BP controlled with lifestyle changes         Endocrine   Hyperlipidemia associated with type 2 diabetes mellitus (HCC) (Chronic)   Type II diabetes mellitus with complication (HCC) - Primary (Chronic)    Blood sugars stable without hypoglycemic symptoms or events. Currently being treated with Januvia. Lab Results  Component Value Date   HGBA1C 7.0 (H) 05/05/2022  Eye exam scheduled for June A1C today 6.9        Relevant Orders   POCT glycosylated hemoglobin (Hb A1C) (Completed)     Other   Chronic pain syndrome    Secondary to both PHN and lumbar spinal stenosis On Lyrica bid and occasional use of Tramdol      Relevant Medications   traMADol (ULTRAM) 50 MG tablet   Other Visit Diagnoses     Diverticulitis of large intestine without perforation or abscess without bleeding       Recent ER at [facility] CT unremarkable on Augmentin and Zofran; symptoms improving encourage Protein supplements and gradual return to ADLs   Relevant Medications   traMADol (ULTRAM) 50 MG tablet       Return in about 4 months (around 04/11/2023) for DM.   Partially dictated using Dragon software, any errors are not intentional.  Reubin Milan, MD [facility] [place], [place]

## 2022-12-14 ENCOUNTER — Ambulatory Visit: Payer: Self-pay | Admitting: *Deleted

## 2022-12-14 NOTE — Telephone Encounter (Signed)
Reason for Disposition  [1] COVID-19 EXPOSURE within last 14 days AND [2] NO symptoms  Answer Assessment - Initial Assessment Questions 1. COVID-19 EXPOSURE: "Please describe how you were exposed to someone with a COVID-19 infection."     Son was diagnosed- Sunday 2. PLACE of CONTACT: "Where were you when you were exposed to COVID-19?" (e.g., home, school, medical waiting room; which city?)     home 3. TYPE of CONTACT: "How much contact was there?" (e.g., sitting next to, live in same house, work in same office, same building)     Visit at home- several hours 4. DURATION of CONTACT: "How long were you in contact with the COVID-19 patient?" (e.g., a few seconds, passed by person, a few minutes, 15 minutes or longer, live with the patient)     Several hours 5. MASK: "Were you wearing a mask?" "Was the other person wearing a mask?" Note: wearing a mask reduces the risk of an otherwise close contact.     no 6. DATE of CONTACT: "When did you have contact with a COVID-19 patient?" (e.g., how many days ago)     Sunday -12/12/22 7. COMMUNITY SPREAD: "Do you live in or have you traveled to an area where there are lots of COVID-19 cases (community spread)?" (See public health department website, if unsure)       yes 8. SYMPTOMS: "Do you have any symptoms?" (e.g., fever, cough, breathing difficulty, loss of taste or smell)     no  Protocols used: Coronavirus (COVID-19) Exposure-A-AH

## 2022-12-14 NOTE — Telephone Encounter (Signed)
  Chief Complaint: COVID exposure  Symptoms: no symptoms Frequency: exposure Sunday Pertinent Negatives: Patient denies symptoms at this time Disposition: [] ED /[] Urgent Care (no appt availability in office) / [] Appointment(In office/virtual)/ []  [facility]/ [x] Home Care/ [] Refused Recommended Disposition /[]  Mobile Bus/ []  Follow-up with PCP Additional Notes: Patient advised per COVID protocol for exposure- will test Friday for screening if she does not develop symptoms before.

## 2022-12-16 ENCOUNTER — Ambulatory Visit: Payer: Self-pay

## 2022-12-16 NOTE — Telephone Encounter (Signed)
Please schedule a VV. Pt needs to be seen before anything can be sent in.  KP

## 2022-12-16 NOTE — Telephone Encounter (Signed)
Message from Sidell sent at 12/16/2022 11:52 AM EDT  Summary: Pt reports that she tested positive for Covid and requests medication   Pt reports that she tested positive for Covid and she would like to speak with a nurse regarding getting some medication. Cb# [id number]         Chief Complaint: covid positive Symptoms: started Tuesday with head congestion, hoarse, clearing throat more often Frequency: Tuesday  Pertinent Negatives: Patient denies Cough, SOB  Disposition: [] ED /[] Urgent Care (no appt availability in office) / [] Appointment(In office/virtual)/ []  [facility]/ [] Home Care/ [] Refused Recommended Disposition /[] [facility]/ [x]  Follow-up with PCP Additional Notes: Pt asking for Paxlovid. Pt does not have cell phone or MyChart. Please make appt for pt.   Reason for Disposition  [1] HIGH RISK patient (e.g., weak immune system, age > 64 years, obesity with BMI 30 or higher, pregnant, chronic lung disease or other chronic medical condition) AND [2] COVID symptoms (e.g., cough, fever)  (Exceptions: Already seen by PCP and no new or worsening symptoms.)  Answer Assessment - Initial Assessment Questions 1. COVID-19 DIAGNOSIS: "How do you know that you have COVID?" (e.g., positive lab test or self-test, diagnosed by doctor or NP/PA, symptoms after exposure).     Self test  2. COVID-19 EXPOSURE: "Was there any known exposure to COVID before the symptoms began?" CDC Definition of close contact: within 6 feet (2 meters) for a total of 15 minutes or more over a 24-hour period.      *No Answer* 3. ONSET: "When did the COVID-19 symptoms start?"      Tuesday  4. WORST SYMPTOM: "What is your worst symptom?" (e.g., cough, fever, shortness of breath, muscle aches)     Achy joints- head congestion - hot and colf flashes 5. COUGH: "Do you have a cough?" If Yes, ask: "How bad is the cough?"       no 6. FEVER: "Do you have a fever?" If Yes, ask: "What is your  temperature, how was it measured, and when did it start?"     no 7. RESPIRATORY STATUS: "Describe your breathing?" (e.g., normal; shortness of breath, wheezing, unable to speak)      no 8. BETTER-SAME-WORSE: "Are you getting better, staying the same or getting worse compared to yesterday?"  If getting worse, ask, "In what way?"     *No Answer* 9. OTHER SYMPTOMS: "Do you have any other symptoms?"  (e.g., chills, fatigue, headache, loss of smell or taste, muscle pain, sore throat)     Head congestion, clearing throat  10. HIGH RISK DISEASE: "Do you have any chronic medical problems?" (e.g., asthma, heart or lung disease, weak immune system, obesity, etc.)       elderly 11. VACCINE: "Have you had the COVID-19 vaccine?" If Yes, ask: "Which one, how many shots, when did you get it?"       N/a 12. PREGNANCY: "Is there any chance you are pregnant?" "When was your last menstrual period?"       N/a 13. O2 SATURATION MONITOR:  "Do you use an oxygen saturation monitor (pulse oximeter) at home?" If Yes, ask "What is your reading (oxygen level) today?" "What is your usual oxygen saturation reading?" (e.g., 95%)       N/a  Protocols used: Coronavirus (COVID-19) Diagnosed or Suspected-A-AH

## 2022-12-16 NOTE — Telephone Encounter (Signed)
Pt wants medication. Can not do VV due to not having a cell phone.  KP

## 2022-12-17 ENCOUNTER — Other Ambulatory Visit: Payer: Self-pay | Admitting: Internal Medicine

## 2022-12-17 DIAGNOSIS — R110 Nausea: Secondary | ICD-10-CM

## 2022-12-17 MED ORDER — ONDANSETRON HCL 4 MG PO TABS: 4.0000 mg | ORAL_TABLET | Freq: Three times a day (TID) | ORAL | 0 refills | Status: DC | PRN

## 2023-01-08 ENCOUNTER — Other Ambulatory Visit: Payer: Self-pay | Admitting: Internal Medicine

## 2023-01-08 DIAGNOSIS — B0229 Other postherpetic nervous system involvement: Secondary | ICD-10-CM

## 2023-01-08 DIAGNOSIS — F411 Generalized anxiety disorder: Secondary | ICD-10-CM

## 2023-01-10 ENCOUNTER — Other Ambulatory Visit: Payer: Self-pay | Admitting: Internal Medicine

## 2023-01-10 DIAGNOSIS — F411 Generalized anxiety disorder: Secondary | ICD-10-CM

## 2023-01-10 DIAGNOSIS — B0229 Other postherpetic nervous system involvement: Secondary | ICD-10-CM

## 2023-01-10 NOTE — Telephone Encounter (Signed)
Requested medication (s) are due for refill today: yes  Requested medication (s) are on the active medication list: yes  Last refill:  06/15/22  Future visit scheduled: yes  Notes to clinic:  Unable to refill per protocol, cannot delegate.      Requested Prescriptions  Pending Prescriptions Disp Refills   pregabalin (LYRICA) 100 MG capsule [Pharmacy Med Name: PREGABALIN 100 MG CAPSULE] 90 capsule     Sig: TAKE 1 CAPSULE BY MOUTH THREE TIMES A DAY     Not Delegated - Neurology:  Anticonvulsants - Controlled - pregabalin Failed - 01/08/2023 12:08 PM      Failed - This refill cannot be delegated      Passed - Cr in normal range and within 360 days    Creatinine  Date Value Ref Range Status  04/29/2014 0.91 0.60 - 1.30 mg/dL Final   Creatinine, Ser  Date Value Ref Range Status  05/05/2022 0.78 0.57 - 1.00 mg/dL Final         Passed - Completed PHQ-2 or PHQ-9 in the last 360 days      Passed - Valid encounter within last 12 months    Recent Outpatient Visits           1 month ago Type II diabetes mellitus with complication [facility])   [place] [facility] at [facility], Nyoka Cowden, MD   8 months ago Annual physical exam   [facility] at [facility], Nyoka Cowden, MD   1 year ago Type II diabetes mellitus with complication [facility])   [place] [facility] at [facility], Nyoka Cowden, MD   1 year ago Spinal stenosis of lumbar region at multiple levels   [facility] at [facility], Nyoka Cowden, MD   1 year ago Hyperlipidemia associated with type 2 diabetes mellitus [facility])   [place] [facility] at [facility], Nyoka Cowden, MD       Future Appointments             In 1 month Richardo Hanks, Laurette Schimke, MD [facility] [place]   In 3 months Reubin Milan, MD [facility] Primary Care & Sports Medicine at  [facility], [facility]             ALPRAZolam Prudy Feeler) 0.25 MG tablet [Pharmacy Med Name: ALPRAZOLAM 0.25 MG TABLET] 30 tablet     Sig: TAKE 1 TABLET (0.25 MG TOTAL) BY MOUTH DAILY AS NEEDED. FOR ANXIETY     Not Delegated - Psychiatry: Anxiolytics/Hypnotics 2 Failed - 01/08/2023 12:08 PM      Failed - This refill cannot be delegated      Failed - Urine Drug Screen completed in last 360 days      Passed - Patient is not pregnant      Passed - Valid encounter within last 6 months    Recent Outpatient Visits           1 month ago Type II diabetes mellitus with complication [facility])   [place] [facility] at [facility], Nyoka Cowden, MD   8 months ago Annual physical exam   [facility] at [facility], Nyoka Cowden, MD   1 year ago Type II diabetes mellitus with complication [facility])   [place] [facility] at  [facility]   1 year ago Spinal stenosis of lumbar region at multiple levels   [facility] Primary Care & Sports Medicine at [facility], Nyoka Cowden, MD   1 year ago Hyperlipidemia associated with type 2 diabetes mellitus [facility])   [place] [facility] at [facility] Nyoka Cowden, MD       Future Appointments             In 1 month Richardo Hanks, Laurette Schimke, MD [facility] [place]   In 3 months Judithann Graves Nyoka Cowden, MD [facility] Primary Care & Sports Medicine at [facility], [facility]

## 2023-01-10 NOTE — Telephone Encounter (Signed)
Medication Refill - Medication: ALPRAZolam (XANAX) 0.25 MG tablet and pregabalin (LYRICA) 100 MG capsule   Has the patient contacted their pharmacy? Yes.   Pt told to contact provider  Preferred Pharmacy (with phone number or street name):  [id number] [place], [place] - [address] Phone: [phone]  Fax: [phone]     Has the patient been seen for an appointment in the last year OR does the patient have an upcoming appointment? Yes.    Agent: Please be advised that RX refills may take up to 3 business days. We ask that you follow-up with your pharmacy.

## 2023-01-10 NOTE — Telephone Encounter (Signed)
Please review.  KP

## 2023-01-11 NOTE — Telephone Encounter (Signed)
Unable to refill per protocol, last refill by  provider 01/10/23, duplicate request.  Requested Prescriptions  Pending Prescriptions Disp Refills   ALPRAZolam (XANAX) 0.25 MG tablet 30 tablet 5    Sig: Take 1 tablet (0.25 mg total) by mouth daily as needed. for anxiety     Not Delegated - Psychiatry: Anxiolytics/Hypnotics 2 Failed - 01/10/2023 11:36 AM      Failed - This refill cannot be delegated      Failed - Urine Drug Screen completed in last 360 days      Passed - Patient is not pregnant      Passed - Valid encounter within last 6 months    Recent Outpatient Visits           1 month ago Type II diabetes mellitus with complication [facility])   [place] [facility] at [facility], Nyoka Cowden, MD   8 months ago Annual physical exam   [facility] at [facility], Nyoka Cowden, MD   1 year ago Type II diabetes mellitus with complication [facility])   [place] [facility] at [facility], Nyoka Cowden, MD   1 year ago Spinal stenosis of lumbar region at multiple levels   [facility] at [facility], Nyoka Cowden, MD   1 year ago Hyperlipidemia associated with type 2 diabetes mellitus [facility])   [place] [facility] at [employer] [facility], Nyoka Cowden, MD       Future Appointments             In 1 month Richardo Hanks, Laurette Schimke, MD [facility] [place]   In 3 months Reubin Milan, MD [facility] Primary Care & Sports Medicine at [facility] [place], [facility]             pregabalin (LYRICA) 100 MG capsule 90 capsule 5    Sig: TAKE 1 CAPSULE (100 MG TOTAL) BY MOUTH 3 TIMES A DAY     Not Delegated - Neurology:  Anticonvulsants - Controlled - pregabalin Failed - 01/10/2023 11:36 AM      Failed - This refill cannot be delegated      Passed - Cr in normal range and within 360 days    Creatinine  Date Value Ref Range Status   04/29/2014 0.91 0.60 - 1.30 mg/dL Final   Creatinine, Ser  Date Value Ref Range Status  05/05/2022 0.78 0.57 - 1.00 mg/dL Final         Passed - Completed PHQ-2 or PHQ-9 in the last 360 days      Passed - Valid encounter within last 12 months    Recent Outpatient Visits           1 month ago Type II diabetes mellitus with complication [facility])   [place] [facility] at [facility], Nyoka Cowden, MD   8 months ago Annual physical exam   [facility] Medicine at [facility], Nyoka Cowden, MD   1 year ago Type II diabetes mellitus with complication [facility])   [place] [facility] at [facility], Nyoka Cowden, MD   1 year ago Spinal stenosis of lumbar region at multiple levels   [facility] at [facility], Nyoka Cowden, MD   1 year ago Hyperlipidemia associated with type  2 diabetes mellitus [facility])   [place] [facility] Medicine at [facility], Nyoka Cowden, MD       Future Appointments             In 1 month Richardo Hanks, Laurette Schimke, MD [facility] [place]   In 3 months Judithann Graves Nyoka Cowden, MD [facility] Medicine at [facility], [facility]

## 2023-01-12 DIAGNOSIS — L400 Psoriasis vulgaris: Secondary | ICD-10-CM | POA: Diagnosis not present

## 2023-01-12 DIAGNOSIS — Z79899 Other long term (current) drug therapy: Secondary | ICD-10-CM | POA: Diagnosis not present

## 2023-01-19 DIAGNOSIS — Z79899 Other long term (current) drug therapy: Secondary | ICD-10-CM | POA: Diagnosis not present

## 2023-02-01 DIAGNOSIS — E113292 Type 2 diabetes mellitus with mild nonproliferative diabetic retinopathy without macular edema, left eye: Secondary | ICD-10-CM | POA: Diagnosis not present

## 2023-02-01 DIAGNOSIS — H43813 Vitreous degeneration, bilateral: Secondary | ICD-10-CM | POA: Diagnosis not present

## 2023-02-01 DIAGNOSIS — Z961 Presence of intraocular lens: Secondary | ICD-10-CM | POA: Diagnosis not present

## 2023-02-01 LAB — HM DIABETES EYE EXAM

## 2023-02-07 ENCOUNTER — Encounter: Payer: Self-pay | Admitting: Internal Medicine

## 2023-02-17 ENCOUNTER — Other Ambulatory Visit: Payer: Self-pay | Admitting: Urology

## 2023-02-17 DIAGNOSIS — N952 Postmenopausal atrophic vaginitis: Secondary | ICD-10-CM

## 2023-02-23 ENCOUNTER — Other Ambulatory Visit: Payer: Self-pay | Admitting: Internal Medicine

## 2023-02-23 DIAGNOSIS — B0229 Other postherpetic nervous system involvement: Secondary | ICD-10-CM

## 2023-02-28 ENCOUNTER — Other Ambulatory Visit: Payer: Self-pay | Admitting: Internal Medicine

## 2023-03-01 ENCOUNTER — Ambulatory Visit: Payer: Medicare PPO | Admitting: Urology

## 2023-03-09 ENCOUNTER — Ambulatory Visit: Payer: Medicare PPO | Admitting: Urology

## 2023-03-21 ENCOUNTER — Other Ambulatory Visit: Payer: Self-pay | Admitting: Urology

## 2023-03-22 ENCOUNTER — Encounter: Payer: Self-pay | Admitting: Urology

## 2023-03-22 ENCOUNTER — Ambulatory Visit: Payer: Medicare PPO | Admitting: Urology

## 2023-03-22 VITALS — BP 129/78 | HR 83 | Ht 66.0 in | Wt 160.0 lb

## 2023-03-22 DIAGNOSIS — N3281 Overactive bladder: Secondary | ICD-10-CM

## 2023-03-22 DIAGNOSIS — N390 Urinary tract infection, site not specified: Secondary | ICD-10-CM

## 2023-03-22 DIAGNOSIS — N3946 Mixed incontinence: Secondary | ICD-10-CM | POA: Diagnosis not present

## 2023-03-22 DIAGNOSIS — Z87440 Personal history of urinary (tract) infections: Secondary | ICD-10-CM | POA: Diagnosis not present

## 2023-03-22 MED ORDER — MIRABEGRON ER 50 MG PO TB24: 50.0000 mg | ORAL_TABLET | Freq: Every day | ORAL | 11 refills | Status: DC

## 2023-03-22 MED ORDER — NITROFURANTOIN MACROCRYSTAL 50 MG PO CAPS: 50.0000 mg | ORAL_CAPSULE | ORAL | 3 refills | Status: DC

## 2023-03-22 NOTE — Progress Notes (Signed)
   03/22/2023 10:35 AM   Jordan Montoya [birth year] [id number]  Reason for visit: Follow up recurrent UTI, OAB, stress incontinence  HPI: 89 year old female who presented with recurrent E. coli UTIs.  She had had 5 UTIs over a 4 to 99-month..  She has done very well on cranberry tablet prophylaxis, Premarin cream, and daily nitrofurantoin prophylaxis.  We previously had trialed her off of the prophylactic antibiotic and she had problems with recurrent infections and opted to resume prophylaxis.  She denies any problems over the last year, specifically no infections or gross hematuria.  She has not been as compliant with the Premarin cream recently.  I personally viewed and interpreted the CT abdomen and pelvis with IV contrast from May 2024 for lower abdominal pain that is benign, specifically no hydronephrosis or stones, normal-appearing bladder.  She takes Myrbetriq 50 mg daily for OAB which helped significantly with urgency, frequency, and urge incontinence.  She wears a depends for stress incontinence with laughing and moving around.  We discussed treatment options for stress incontinence including pessary, but she is not interested in further evaluation or treatment at this time.  I recommended resuming the Premarin cream 3 times weekly, continuing cranberry tablet prophylaxis, and spacing the nitrofurantoin prophylaxis 2-3 times weekly.  If she develops infections, can resume daily nitrofurantoin prophylaxis.  Refills provided.  RTC 1 year for refills   Sondra Come, MD  [facility] [address] [place], [place] [address] [phone]

## 2023-04-18 ENCOUNTER — Encounter: Payer: Self-pay | Admitting: Internal Medicine

## 2023-04-18 ENCOUNTER — Ambulatory Visit: Payer: Medicare PPO | Admitting: Internal Medicine

## 2023-04-18 ENCOUNTER — Other Ambulatory Visit: Payer: Self-pay | Admitting: Internal Medicine

## 2023-04-18 VITALS — BP 126/78 | HR 76 | Ht 66.0 in | Wt 162.0 lb

## 2023-04-18 DIAGNOSIS — L4050 Arthropathic psoriasis, unspecified: Secondary | ICD-10-CM

## 2023-04-18 DIAGNOSIS — E559 Vitamin D deficiency, unspecified: Secondary | ICD-10-CM

## 2023-04-18 DIAGNOSIS — Z23 Encounter for immunization: Secondary | ICD-10-CM | POA: Diagnosis not present

## 2023-04-18 DIAGNOSIS — Z7984 Long term (current) use of oral hypoglycemic drugs: Secondary | ICD-10-CM | POA: Diagnosis not present

## 2023-04-18 DIAGNOSIS — E785 Hyperlipidemia, unspecified: Secondary | ICD-10-CM

## 2023-04-18 DIAGNOSIS — E118 Type 2 diabetes mellitus with unspecified complications: Secondary | ICD-10-CM

## 2023-04-18 DIAGNOSIS — E1169 Type 2 diabetes mellitus with other specified complication: Secondary | ICD-10-CM

## 2023-04-18 DIAGNOSIS — F411 Generalized anxiety disorder: Secondary | ICD-10-CM

## 2023-04-18 NOTE — Assessment & Plan Note (Signed)
Uses Xanax rarely Overall doing well

## 2023-04-18 NOTE — Assessment & Plan Note (Signed)
Blood sugars stable without hypoglycemic symptoms or events. Current regimen is Januvia. Changes made last visit are none. Lab Results  Component Value Date   HGBA1C 6.9 (A) 12/09/2022

## 2023-04-18 NOTE — Assessment & Plan Note (Signed)
LDL is  Lab Results  Component Value Date   LDLCALC 105 (H) 05/05/2022  Currently managed with diet alone.

## 2023-04-18 NOTE — Progress Notes (Signed)
Date:  04/18/2023   Name:  Jordan Montoya   DOB:  14-May-1934   MRN:  [id number]   Chief Complaint: Diabetes  Diabetes She presents for her follow-up diabetic visit. She has type 2 diabetes mellitus. Her disease course has been stable (BS at 130's). Pertinent negatives for hypoglycemia include no headaches, nervousness/anxiousness or tremors. Pertinent negatives for diabetes include no chest pain, no fatigue, no polydipsia and no polyuria. Current diabetic treatment includes oral agent (monotherapy) Alma Friendly).  Hyperlipidemia Pertinent negatives include no chest pain or shortness of breath.  Back Pain This is a chronic problem. The problem is unchanged. The pain is present in the lumbar spine. Pertinent negatives include no abdominal pain, chest pain, dysuria, fever, headaches or numbness. Treatments tried: uses Tramadol rarely.  PHN - on Lyrica with some benefit.  Also has hip pain and back pain and has seen Ortho. She has PA and is on Taltz from Dermatology.   Lab Results  Component Value Date   NA 141 05/05/2022   K 4.6 05/05/2022   CO2 22 05/05/2022   GLUCOSE 126 (H) 05/05/2022   BUN 18 05/05/2022   CREATININE 0.78 05/05/2022   CALCIUM 9.6 05/05/2022   EGFR 74 05/05/2022   GFRNONAA 59 (L) 04/14/2020   Lab Results  Component Value Date   CHOL 198 05/05/2022   HDL 60 05/05/2022   LDLCALC 105 (H) 05/05/2022   TRIG 190 (H) 05/05/2022   CHOLHDL 3.3 05/05/2022   Lab Results  Component Value Date   TSH 2.830 05/05/2022   Lab Results  Component Value Date   HGBA1C 6.9 (A) 12/09/2022   Lab Results  Component Value Date   WBC 4.8 05/05/2022   HGB 12.2 05/05/2022   HCT 36.2 05/05/2022   MCV 87 05/05/2022   PLT 243 05/05/2022   Lab Results  Component Value Date   ALT 19 05/05/2022   AST 22 05/05/2022   ALKPHOS 87 05/05/2022   BILITOT 0.3 05/05/2022   Lab Results  Component Value Date   VD25OH 19.4 (L) 05/05/2022     Review of Systems  Constitutional:   Negative for appetite change, fatigue, fever and unexpected weight change.  HENT:  Negative for tinnitus and trouble swallowing.   Eyes:  Negative for visual disturbance.  Respiratory:  Negative for cough, chest tightness and shortness of breath.   Cardiovascular:  Negative for chest pain, palpitations and leg swelling.  Gastrointestinal:  Negative for abdominal pain.  Endocrine: Negative for polydipsia and polyuria.  Genitourinary:  Negative for dysuria and hematuria.  Musculoskeletal:  Positive for back pain and gait problem. Negative for arthralgias.  Neurological:  Negative for tremors, numbness and headaches.  Psychiatric/Behavioral:  Negative for dysphoric mood and sleep disturbance. The patient is not nervous/anxious.     Patient Active Problem List   Diagnosis Date Noted   Psoriatic arthritis (HCC) 08/10/2021   Benign essential hypertension 04/09/2021   Chronic pain of right knee 10/23/2020   Osteoporosis 06/04/2020   Chronic pain syndrome 06/11/2019   Post herpetic neuralgia 03/20/2019   Spinal stenosis of lumbar region at multiple levels 03/16/2019   Anxiety disorder 11/16/2018   Primary osteoarthritis of both hands 08/16/2018   Arthralgia of multiple sites 05/18/2018   Pernicious anemia 12/29/2017   Type II diabetes mellitus with complication (HCC) 09/30/2015   Environmental and seasonal allergies 05/30/2015   Hip pain, chronic 01/27/2015   Avitaminosis D 11/25/2014   Psoriasis 11/25/2014   Hyperlipidemia associated with type 2  diabetes mellitus (HCC) 08/20/2011    Allergies  Allergen Reactions   Metformin Nausea Only   Macrobid [Nitrofurantoin] Rash    Past Surgical History:  Procedure Laterality Date   ABDOMINAL HYSTERECTOMY     BREAST BIOPSY Left    neg-bx/clip   BREAST CYST EXCISION Left    neg   EYE SURGERY Bilateral    cataracts   PARTIAL HYSTERECTOMY     one ovary remains    Social History   Tobacco Use   Smoking status: Former    Current  packs/day: 0.00    Average packs/day: 0.3 packs/day for 20.0 years (5.0 ttl pk-yrs)    Types: Cigarettes    Start date: 10/10/1959    Quit date: 10/10/1979    Years since quitting: 43.5    Passive exposure: Past   Smokeless tobacco: Never   Tobacco comments:    Smoking cessation not required  Vaping Use   Vaping status: Never Used  Substance Use Topics   Alcohol use: No    Alcohol/week: 0.0 standard drinks of alcohol   Drug use: No     Medication list has been reviewed and updated.  Current Meds  Medication Sig   Acetaminophen (TYLENOL ARTHRITIS EXT RELIEF PO) Take by mouth daily as needed.   ALPRAZolam (XANAX) 0.25 MG tablet TAKE 1 TABLET (0.25 MG TOTAL) BY MOUTH DAILY AS NEEDED. FOR ANXIETY   Azelastine HCl (ASTEPRO) 0.15 % SOLN Place 1 spray into the nose 2 (two) times daily.   docusate sodium (COLACE) 100 MG capsule Take 200 mg by mouth 2 (two) times daily as needed for mild constipation.   estradiol (ESTRACE) 0.1 MG/GM vaginal cream 0.5MG  (PEA-SIZED AMOUNT) JUST INSIDE THE VAGINAL INTROITUS WITH A FINGER-TIP ON MON,WED,FRI NIGHTS   fluticasone (FLONASE) 50 MCG/ACT nasal spray INSTILL 1 SPRAY INTO BOTH NOSTRILS DAILY   Ixekizumab (TALTZ Pollard) Inject into the skin every 30 (thirty) days.   mirabegron ER (MYRBETRIQ) 50 MG TB24 tablet Take 1 tablet (50 mg total) by mouth daily.   naproxen sodium (ALEVE) 220 MG tablet Take 220 mg by mouth.   nitrofurantoin (MACRODANTIN) 50 MG capsule Take 1 capsule (50 mg total) by mouth every other day.   ondansetron (ZOFRAN) 4 MG tablet Take 1 tablet (4 mg total) by mouth every 8 (eight) hours as needed for nausea or vomiting.   pregabalin (LYRICA) 100 MG capsule TAKE 1 CAPSULE BY MOUTH TWICE A DAY   Probiotic Product (PROBIOTIC-10 PO) Take by mouth.   sitaGLIPtin (JANUVIA) 100 MG tablet TAKE 1 TABLET BY MOUTH EVERY DAY   traMADol (ULTRAM) 50 MG tablet Take 1 tablet (50 mg total) by mouth every 12 (twelve) hours as needed.   Vitamin D,  Ergocalciferol, (DRISDOL) 1.25 MG (50000 UNIT) CAPS capsule Take 50,000 Units by mouth every 7 (seven) days.   [DISCONTINUED] scopolamine (TRANSDERM SCOP, 1.5 MG,) 1 MG/3DAYS Place 1 patch (1.5 mg total) onto the skin every 3 (three) days.       04/18/2023   10:39 AM 05/05/2022   10:55 AM 12/08/2021   10:21 AM 08/10/2021    9:54 AM  GAD 7 : Generalized Anxiety Score  Nervous, Anxious, on Edge 0 0 0 0  Control/stop worrying 0 0 0 0  Worry too much - different things 0 0 0 0  Trouble relaxing 0 0 1 0  Restless 0 0 1 0  Easily annoyed or irritable 0 0 0 0  Afraid - awful might happen 0 0 0 0  Total GAD 7 Score 0 0 2 0  Anxiety Difficulty Not difficult at all Not difficult at all Not difficult at all Not difficult at all       04/18/2023   10:39 AM 06/02/2022   10:41 AM 05/05/2022   10:54 AM  Depression screen PHQ 2/9  Decreased Interest 0 0 0  Down, Depressed, Hopeless 0 0 0  PHQ - 2 Score 0 0 0  Altered sleeping 0 0 0  Tired, decreased energy 0 0 0  Change in appetite 0 0 0  Feeling bad or failure about yourself  0 0 0  Trouble concentrating 0 0 0  Moving slowly or fidgety/restless 0 0 0  Suicidal thoughts 0 0 0  PHQ-9 Score 0 0 0  Difficult doing work/chores Not difficult at all Not difficult at all Not difficult at all    BP Readings from Last 3 Encounters:  04/18/23 126/78  03/22/23 129/78  12/09/22 120/62    Physical Exam Vitals and nursing note reviewed.  Constitutional:      General: She is not in acute distress.    Appearance: She is well-developed.  HENT:     Head: Normocephalic and atraumatic.  Cardiovascular:     Rate and Rhythm: Normal rate and regular rhythm.     Pulses: Normal pulses.     Heart sounds: No murmur heard. Pulmonary:     Effort: Pulmonary effort is normal. No respiratory distress.  Musculoskeletal:     Cervical back: Normal range of motion.     Right lower leg: No edema.     Left lower leg: No edema.  Skin:    General: Skin is warm and  dry.     Capillary Refill: Capillary refill takes less than 2 seconds.     Findings: No rash.  Neurological:     General: No focal deficit present.     Mental Status: She is alert and oriented to person, place, and time.  Psychiatric:        Mood and Affect: Mood normal.        Behavior: Behavior normal.    Diabetic Foot Exam - Simple   Simple Foot Form Diabetic Foot exam was performed with the following findings: Yes 04/18/2023 10:53 AM  Visual Inspection No deformities, no ulcerations, no other skin breakdown bilaterally: Yes Sensation Testing See comments: Yes Pulse Check Posterior Tibialis and Dorsalis pulse intact bilaterally: Yes Comments Mild decrease in sensation distal feet bilaterally      Wt Readings from Last 3 Encounters:  04/18/23 162 lb (73.5 kg)  03/22/23 160 lb (72.6 kg)  12/09/22 160 lb (72.6 kg)    BP 126/78   Pulse 76   Ht 5\' 6"  (1.676 m)   Wt 162 lb (73.5 kg)   SpO2 94%   BMI 26.15 kg/m   Assessment and Plan:  Problem List Items Addressed This Visit       Unprioritized   Type II diabetes mellitus with complication (HCC) - Primary (Chronic)    Blood sugars stable without hypoglycemic symptoms or events. Current regimen is Januvia. Changes made last visit are none. Lab Results  Component Value Date   HGBA1C 6.9 (A) 12/09/2022         Relevant Orders   Comprehensive metabolic panel   CBC with Differential/Platelet   Hemoglobin A1c   Psoriatic arthritis (HCC) (Chronic)    On Humira from Dermatology for psoriasis Taking Lyrica for pain -       Relevant  Orders   CBC with Differential/Platelet   Hyperlipidemia associated with type 2 diabetes mellitus (HCC) (Chronic)    LDL is  Lab Results  Component Value Date   LDLCALC 105 (H) 05/05/2022  Currently managed with diet alone.       Relevant Orders   Lipid panel   Avitaminosis D (Chronic)    Now on high dose weekly prescribed by Dermatology      Anxiety disorder (Chronic)     Uses Xanax rarely Overall doing well      Relevant Orders   TSH   Other Visit Diagnoses     Long term current use of oral hypoglycemic drug       Need for influenza vaccination       Relevant Orders   Flu Vaccine Trivalent High Dose (Fluad) (Completed)       Return in about 4 months (around 08/18/2023) for DM.    Reubin Milan, MD [facility] Primary Care and Sports Medicine [place]

## 2023-04-18 NOTE — Assessment & Plan Note (Signed)
Now on high dose weekly prescribed by Dermatology

## 2023-04-18 NOTE — Assessment & Plan Note (Signed)
On Humira from Dermatology for psoriasis Taking Lyrica for pain -

## 2023-04-19 LAB — LIPID PANEL
Chol/HDL Ratio: 3 ratio (ref 0.0–4.4)
Cholesterol, Total: 171 mg/dL (ref 100–199)
HDL: 57 mg/dL (ref >39)
LDL Chol Calc (NIH): 85 mg/dL (ref 0–99)
Triglycerides: 169 mg/dL — ABNORMAL HIGH (ref 0–149)
VLDL Cholesterol Cal: 29 mg/dL (ref 5–40)

## 2023-04-19 LAB — COMPREHENSIVE METABOLIC PANEL
ALT: 12 IU/L (ref 0–32)
AST: 17 IU/L (ref 0–40)
Albumin: 4.2 g/dL (ref 3.7–4.7)
Alkaline Phosphatase: 93 IU/L (ref 44–121)
BUN/Creatinine Ratio: 27 (ref 12–28)
BUN: 23 mg/dL (ref 8–27)
Bilirubin Total: 0.2 mg/dL (ref 0.0–1.2)
CO2: 20 mmol/L (ref 20–29)
Calcium: 9.6 mg/dL (ref 8.7–10.3)
Chloride: 105 mmol/L (ref 96–106)
Creatinine, Ser: 0.84 mg/dL (ref 0.57–1.00)
Globulin, Total: 2.9 g/dL (ref 1.5–4.5)
Glucose: 175 mg/dL — ABNORMAL HIGH (ref 70–99)
Potassium: 4.4 mmol/L (ref 3.5–5.2)
Sodium: 141 mmol/L (ref 134–144)
Total Protein: 7.1 g/dL (ref 6.0–8.5)
eGFR: 67 mL/min/{1.73_m2} (ref >59)

## 2023-04-19 LAB — CBC WITH DIFFERENTIAL/PLATELET
Basophils Absolute: 0 10*3/uL (ref 0.0–0.2)
Basos: 1 %
EOS (ABSOLUTE): 0.1 10*3/uL (ref 0.0–0.4)
Eos: 2 %
Hematocrit: 36.4 % (ref 34.0–46.6)
Hemoglobin: 12.1 g/dL (ref 11.1–15.9)
Immature Grans (Abs): 0 10*3/uL (ref 0.0–0.1)
Immature Granulocytes: 0 %
Lymphocytes Absolute: 1.7 10*3/uL (ref 0.7–3.1)
Lymphs: 30 %
MCH: 29.1 pg (ref 26.6–33.0)
MCHC: 33.2 g/dL (ref 31.5–35.7)
MCV: 88 fL (ref 79–97)
Monocytes Absolute: 0.4 10*3/uL (ref 0.1–0.9)
Monocytes: 7 %
Neutrophils Absolute: 3.4 10*3/uL (ref 1.4–7.0)
Neutrophils: 60 %
Platelets: 237 10*3/uL (ref 150–450)
RBC: 4.16 x10E6/uL (ref 3.77–5.28)
RDW: 12.9 % (ref 11.7–15.4)
WBC: 5.6 10*3/uL (ref 3.4–10.8)

## 2023-04-19 LAB — HGB A1C W/O EAG: Hgb A1c MFr Bld: 7.2 % — ABNORMAL HIGH (ref 4.8–5.6)

## 2023-04-19 LAB — TSH: TSH: 3.29 u[IU]/mL (ref 0.450–4.500)

## 2023-06-11 DIAGNOSIS — M810 Age-related osteoporosis without current pathological fracture: Secondary | ICD-10-CM | POA: Diagnosis not present

## 2023-06-11 DIAGNOSIS — R269 Unspecified abnormalities of gait and mobility: Secondary | ICD-10-CM | POA: Diagnosis not present

## 2023-06-11 DIAGNOSIS — K219 Gastro-esophageal reflux disease without esophagitis: Secondary | ICD-10-CM | POA: Diagnosis not present

## 2023-06-11 DIAGNOSIS — I129 Hypertensive chronic kidney disease with stage 1 through stage 4 chronic kidney disease, or unspecified chronic kidney disease: Secondary | ICD-10-CM | POA: Diagnosis not present

## 2023-06-11 DIAGNOSIS — E1142 Type 2 diabetes mellitus with diabetic polyneuropathy: Secondary | ICD-10-CM | POA: Diagnosis not present

## 2023-06-11 DIAGNOSIS — N3941 Urge incontinence: Secondary | ICD-10-CM | POA: Diagnosis not present

## 2023-06-11 DIAGNOSIS — E785 Hyperlipidemia, unspecified: Secondary | ICD-10-CM | POA: Diagnosis not present

## 2023-06-11 DIAGNOSIS — K08409 Partial loss of teeth, unspecified cause, unspecified class: Secondary | ICD-10-CM | POA: Diagnosis not present

## 2023-06-11 DIAGNOSIS — K5900 Constipation, unspecified: Secondary | ICD-10-CM | POA: Diagnosis not present

## 2023-06-11 DIAGNOSIS — M1990 Unspecified osteoarthritis, unspecified site: Secondary | ICD-10-CM | POA: Diagnosis not present

## 2023-06-20 ENCOUNTER — Telehealth: Payer: Self-pay

## 2023-06-20 NOTE — Telephone Encounter (Signed)
Copied from CRM [id number]. Topic: General - Inquiry >> Jun 20, 2023  9:11 AM De Blanch wrote: Reason for CRM: Pt called in to cancel the upcoming AWV. A nurse stopped by her home on Saturday and stayed for an hour. Pt stated she is unsure if the nurse will send all of the information to the office, but she was a Teaching laboratory technician.  Stated was a very good nurse and does not need the upcoming appointment per request canceled AWV telephone call. Please review.

## 2023-06-20 NOTE — Telephone Encounter (Signed)
Patient advised to call Eveline Keto to follow up on visit. Number given and patient voiced understanding.

## 2023-07-08 ENCOUNTER — Other Ambulatory Visit: Payer: Self-pay | Admitting: Internal Medicine

## 2023-07-08 ENCOUNTER — Telehealth: Payer: Self-pay | Admitting: Internal Medicine

## 2023-07-08 DIAGNOSIS — B0229 Other postherpetic nervous system involvement: Secondary | ICD-10-CM

## 2023-07-08 NOTE — Telephone Encounter (Addendum)
Medication Refill -  Most Recent Primary Care Visit:  Provider: Reubin Milan  Department: [facility]  Visit Type: OFFICE VISIT  Date: 04/18/2023  Medication: pregabalin (LYRICA) 100 MG capsule  Has the patient contacted their pharmacy? Yes (Agent: If yes, when and what did the pharmacy advise?) Pt requested refill at pharmacy and was advised by pharmacy to call office as well.   Is this the correct pharmacy for this prescription? Yes This is the patient's preferred pharmacy:  [facility] [phone] Dan Humphreys, [place] - [address] [place] [address] Phone: [phone] Fax: [phone]   Has the prescription been filled recently? Yes  Is the patient out of the medication? No, but pt states she may run out over the weekend.  Has the patient been seen for an appointment in the last year OR does the patient have an upcoming appointment? Yes  Can we respond through MyChart? No  Agent: Please be advised that Rx refills may take up to 3 business days. We ask that you follow-up with your pharmacy.

## 2023-07-08 NOTE — Telephone Encounter (Signed)
Medication refilled today in a separate refill encounter.

## 2023-07-08 NOTE — Telephone Encounter (Signed)
Requested medication (s) are due for refill today -yes  Requested medication (s) are on the active medication list -yes  Future visit scheduled -yes  Last refill: 02/23/23 #180  Notes to clinic: non delegated Rx  Requested Prescriptions  Pending Prescriptions Disp Refills   pregabalin (LYRICA) 100 MG capsule [Pharmacy Med Name: PREGABALIN 100 MG CAPSULE] 180 capsule 0    Sig: TAKE 1 CAPSULE BY MOUTH TWICE A DAY     Not Delegated - Neurology:  Anticonvulsants - Controlled - pregabalin Failed - 07/08/2023 10:59 AM      Failed - This refill cannot be delegated      Passed - Cr in normal range and within 360 days    Creatinine  Date Value Ref Range Status  04/29/2014 0.91 0.60 - 1.30 mg/dL Final   Creatinine, Ser  Date Value Ref Range Status  04/18/2023 0.84 0.57 - 1.00 mg/dL Final         Passed - Completed PHQ-2 or PHQ-9 in the last 360 days      Passed - Valid encounter within last 12 months    Recent Outpatient Visits           2 months ago Type II diabetes mellitus with complication [facility])   [place] [facility] at [facility], Nyoka Cowden, MD   7 months ago Type II diabetes mellitus with complication [facility])   [place] [facility] at [facility], Nyoka Cowden, MD   1 year ago Annual physical exam   [facility] Primary [facility] Medicine at [facility], Nyoka Cowden, MD   1 year ago Type II diabetes mellitus with complication [facility])   [place] [facility] at [facility], Nyoka Cowden, MD   1 year ago Spinal stenosis of lumbar region at multiple levels   [facility] at [facility], Nyoka Cowden, MD       Future Appointments             In 1 month Judithann Graves, Nyoka Cowden, MD [facility] Primary Care & Sports Medicine at [facility]   In 8 months McGowan, Elana Alm [facility] [place]                Requested Prescriptions  Pending Prescriptions Disp Refills   pregabalin (LYRICA) 100 MG capsule [Pharmacy Med Name: PREGABALIN 100 MG CAPSULE] 180 capsule 0    Sig: TAKE 1 CAPSULE BY MOUTH TWICE A DAY     Not Delegated - Neurology:  Anticonvulsants - Controlled - pregabalin Failed - 07/08/2023 10:59 AM      Failed - This refill cannot be delegated      Passed - Cr in normal range and within 360 days    Creatinine  Date Value Ref Range Status  04/29/2014 0.91 0.60 - 1.30 mg/dL Final   Creatinine, Ser  Date Value Ref Range Status  04/18/2023 0.84 0.57 - 1.00 mg/dL Final         Passed - Completed PHQ-2 or PHQ-9 in the last 360 days      Passed - Valid encounter within last 12 months    Recent Outpatient Visits           2 months ago Type II diabetes mellitus with complication [facility])   [place] [facility] at [facility], Nyoka Cowden, MD   7  months ago Type II diabetes mellitus with complication [facility])    Primary Care & Sports Medicine at [facility], Nyoka Cowden, MD   1 year ago Annual physical exam   [facility] at [facility], Nyoka Cowden, MD   1 year ago Type II diabetes mellitus with complication [facility])    Primary [facility] & [facility] at [facility], Nyoka Cowden, MD   1 year ago Spinal stenosis of lumbar region at multiple levels   [facility] Primary Care & Sports Medicine at [facility], Nyoka Cowden, MD       Future Appointments             In 1 month Judithann Graves, Nyoka Cowden, MD [facility] Medicine at [facility], [facility]   In 8 months McGowan, Elana Alm [facility] [place]

## 2023-07-08 NOTE — Telephone Encounter (Signed)
Please review.  KP

## 2023-08-19 ENCOUNTER — Other Ambulatory Visit: Payer: Self-pay | Admitting: Family Medicine

## 2023-08-19 ENCOUNTER — Encounter: Payer: Self-pay | Admitting: Family Medicine

## 2023-08-19 ENCOUNTER — Ambulatory Visit (INDEPENDENT_AMBULATORY_CARE_PROVIDER_SITE_OTHER): Payer: Medicare PPO | Admitting: Family Medicine

## 2023-08-19 ENCOUNTER — Ambulatory Visit: Payer: Medicare PPO | Admitting: Internal Medicine

## 2023-08-19 VITALS — BP 169/94 | HR 87 | Temp 98.0°F | Resp 14 | Ht 66.0 in | Wt 163.4 lb

## 2023-08-19 DIAGNOSIS — L4050 Arthropathic psoriasis, unspecified: Secondary | ICD-10-CM

## 2023-08-19 DIAGNOSIS — M810 Age-related osteoporosis without current pathological fracture: Secondary | ICD-10-CM

## 2023-08-19 DIAGNOSIS — M19041 Primary osteoarthritis, right hand: Secondary | ICD-10-CM

## 2023-08-19 DIAGNOSIS — F411 Generalized anxiety disorder: Secondary | ICD-10-CM | POA: Diagnosis not present

## 2023-08-19 DIAGNOSIS — R002 Palpitations: Secondary | ICD-10-CM | POA: Insufficient documentation

## 2023-08-19 DIAGNOSIS — Z7984 Long term (current) use of oral hypoglycemic drugs: Secondary | ICD-10-CM

## 2023-08-19 DIAGNOSIS — E118 Type 2 diabetes mellitus with unspecified complications: Secondary | ICD-10-CM

## 2023-08-19 DIAGNOSIS — M48061 Spinal stenosis, lumbar region without neurogenic claudication: Secondary | ICD-10-CM | POA: Diagnosis not present

## 2023-08-19 DIAGNOSIS — R5383 Other fatigue: Secondary | ICD-10-CM | POA: Insufficient documentation

## 2023-08-19 DIAGNOSIS — G894 Chronic pain syndrome: Secondary | ICD-10-CM

## 2023-08-19 DIAGNOSIS — R030 Elevated blood-pressure reading, without diagnosis of hypertension: Secondary | ICD-10-CM | POA: Diagnosis not present

## 2023-08-19 DIAGNOSIS — E559 Vitamin D deficiency, unspecified: Secondary | ICD-10-CM

## 2023-08-19 DIAGNOSIS — K5901 Slow transit constipation: Secondary | ICD-10-CM | POA: Diagnosis not present

## 2023-08-19 DIAGNOSIS — I10 Essential (primary) hypertension: Secondary | ICD-10-CM

## 2023-08-19 MED ORDER — TRAMADOL HCL 50 MG PO TABS
50.0000 mg | ORAL_TABLET | Freq: Two times a day (BID) | ORAL | 0 refills | Status: DC | PRN
Start: 2023-08-19 — End: 2024-01-01

## 2023-08-19 MED ORDER — CELECOXIB 100 MG PO CAPS
100.0000 mg | ORAL_CAPSULE | Freq: Every day | ORAL | 3 refills | Status: AC
Start: 2023-08-19 — End: ?

## 2023-08-19 NOTE — Assessment & Plan Note (Signed)
Dexa 2021 T -2.9 femur neck.  Plan to address issue at next OV.

## 2023-08-19 NOTE — Assessment & Plan Note (Signed)
He is on monotherapy with Januvia.  Checking A1c and random blood sugar this morning.

## 2023-08-19 NOTE — Assessment & Plan Note (Addendum)
Reports that when she is getting ready to go to bed at night she has tachycardia, palpitations and shortness of breath.  She denies diaphoresis, CP or syncope.  Takes about 10 to 15 minutes for her racing heart to slow down.  She has not seen a cardiologist in years. Checking CBC, CMP and thyroid studies. EKG showed NSR without ST or T wave changes.

## 2023-08-19 NOTE — Assessment & Plan Note (Signed)
She has Xanax 0.25 which she takes rarely.

## 2023-08-19 NOTE — Assessment & Plan Note (Signed)
She reports that she does not have a history of hypertension.  First blood pressure reading 172/90.  Let her rest rechecked 169/94.  Will bring her back in a month to recheck her pressure.  Checking CMP, CBC TSH and free T4

## 2023-08-19 NOTE — Assessment & Plan Note (Signed)
Patient reports that she has never taken high BP medications but that her BP is always high when she goes to the doctor.  She does not check her BP at home.  Asked her to get a cuff and start checking her blood pressure at home.

## 2023-08-19 NOTE — Assessment & Plan Note (Signed)
Follows rheumatology and is on Taltz for psoriatic arthritis.

## 2023-08-19 NOTE — Assessment & Plan Note (Signed)
Has osteoporosis.  Bone mineral density 06/02/2020 hip -2.9, lumbar films noncontributory.  And takes vitamin D 50,000 units weekly.  Gets her calcium through her diet.  Checking her vitamin D level

## 2023-08-19 NOTE — Assessment & Plan Note (Signed)
She has problems with constipation and uses MiraLAX stool softeners and suppositories.  Encouraged her to use the MiraLAX daily.

## 2023-08-19 NOTE — Progress Notes (Signed)
Established Patient Office Visit  Subjective   Patient ID: Jordan Montoya, female    DOB: 1933-08-28  Age: 89 y.o. MRN: [id number]  Chief Complaint  Patient presents with   Establish Care    HPI She has spinal stenosis and dense peripheral neuropathy.  She can sit and lie down without pain but she cannot stand or walk very long.  She cannot cook because she cannot stand long enough.  She already has a rollator and will not use it.  Explained that leaning forward offloads the pressure on her back.  She has been to 3 pain management clinics and had injections in her back with some degree of success.  She has been taking Tylenol extra strength to 1000 mg capsules 3 times a day.  It helped some but not very much.  She is on Lyrica 100mg  BID.  She has had a prescription for tramadol in the past and would appreciate tramadol daily for pain medication.  She is not allergic to NSAIDs.  She has never had a GI bleed that she is aware of.  She has had recurrent UTIs with E. Coli.  She is currently on nitrofurantoin q. OD and estradiol cream 0.1 mg/g per vagina.  Has not had a bladder infection since starting the nitrofurantoin.  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  She is on Myrbetriq.  She denies that she has had a recent fall.  She has a life alert around her neck. She has osteoporosis.  DEXA scan 06/02/2020 her hip T-score was -2.9.  Lumbar films were not contributory.  She has not broken a bone.  She does not take Fosamax.  Her chart does not show denosumab.  This will be need to be addressed further. She has anxiety and takes Xanax maybe 4 or 5 times a month. PHQ-9 is 0 GAD-7 is 0 Blood pressure is elevated in the office: First reading 172/90, repeat read 169/94.  She is not taking anything for high blood pressure.  She denies headaches, chest pains or shortness of breath.  She has no peripheral edema.  She denies PND and orthopnea . She is on monotherapy,  Januvia, for her diabetes.  Doesn't check her BS very often.  Denies any episodes of low blood sugar, headache, diaphoresis, confusion. Last A1c was 7.2. She has a history of Shingles and had her Shingrix vaccines.   She has psoriatic arthritis and is on Taltz.  She is followed by Dermatology.     ROS    Objective:     BP (!) 169/94 (BP Location: Right Arm, Patient Position: Sitting, Cuff Size: Normal)   Pulse 87   Temp 98 F (36.7 C) (Oral)   Resp 14   Ht 5\' 6"  (1.676 m)   Wt 163 lb 6.4 oz (74.1 kg)   SpO2 92%   BMI 26.37 kg/m    Physical Exam Vitals and nursing note reviewed.  Constitutional:      Appearance: Normal appearance.  HENT:     Head: Normocephalic and atraumatic.  Eyes:     Conjunctiva/sclera: Conjunctivae normal.  Cardiovascular:     Rate and Rhythm: Normal rate and regular rhythm.  Pulmonary:     Effort: Pulmonary effort is normal.     Breath sounds: Normal breath sounds.  Musculoskeletal:     Right lower leg: No edema.     Left lower leg: No edema.  Skin:    General: Skin is warm and  dry.  Neurological:     Mental Status: She is alert and oriented to person, place, and time.  Psychiatric:        Mood and Affect: Mood normal.        Behavior: Behavior normal.        Thought Content: Thought content normal.        Judgment: Judgment normal.       Diabetic foot exam was performed with the following findings:   No deformities, ulcerations, or other skin breakdown Normal sensation of 10g monofilament Intact posterior tibialis and dorsalis pedis pulses      No results found for any visits on 08/19/23.    The ASCVD Risk score (Arnett DK, et al., 2019) failed to calculate for the following reasons:   The 2019 ASCVD risk score is only valid for ages 2 to 37    Assessment & Plan:  Palpitations Assessment & Plan: Reports that when she is getting ready to go to bed at night she has tachycardia, palpitations and shortness of breath.  She  denies diaphoresis, CP or syncope.  Takes about 10 to 15 minutes for her racing heart to slow down.  She has not seen a cardiologist in years. Checking CBC, CMP and thyroid studies. EKG showed NSR without ST or T wave changes.    Orders: -     EKG 12-Lead -     CMP14+EGFR -     CBC with Differential/Platelet  Primary osteoarthritis of both hands  Chronic pain syndrome  Spinal stenosis of lumbar region at multiple levels Assessment & Plan: Spinal stenosis that causes neurogenic claudication.  On Lyrica 100mg  BID.  Sitting down stops her pain.  Will try celebrex 100mg  once daily.  Reassess in a month.    Orders: -     Celecoxib; Take 1 capsule (100 mg total) by mouth daily.  Dispense: 30 capsule; Refill: 3 -     traMADol HCl; Take 1 tablet (50 mg total) by mouth every 12 (twelve) hours as needed.  Dispense: 15 tablet; Refill: 0  Type II diabetes mellitus with complication [facility]) Assessment & Plan: He is on monotherapy with Januvia.  Checking A1c and random blood sugar this morning.  Orders: -     Hemoglobin A1c  Vitamin D deficiency Assessment & Plan: Has osteoporosis.  Bone mineral density 06/02/2020 hip -2.9, lumbar films noncontributory.  And takes vitamin D 50,000 units weekly.  Gets her calcium through her diet.  Checking her vitamin D level  Orders: -     VITAMIN D 25 Hydroxy (Vit-D Deficiency, Fractures)  Other fatigue -     TSH + free T4  Elevated blood pressure reading Assessment & Plan: She reports that she does not have a history of hypertension.  First blood pressure reading 172/90.  Let her rest rechecked 169/94.  Will bring her back in a month to recheck her pressure.  Checking CMP, CBC TSH and free T4   Constipation by delayed colonic transit Assessment & Plan: She has problems with constipation and uses MiraLAX stool softeners and suppositories.  Encouraged her to use the MiraLAX daily.   Generalized anxiety disorder Assessment & Plan: She has Xanax 0.25  which she takes rarely.   Psoriatic arthritis [facility]) Assessment & Plan: Follows rheumatology and is on Taltz for psoriatic arthritis.   Benign essential hypertension Assessment & Plan: Patient reports that she has never taken high BP medications but that her BP is always high when she goes to the  doctor.  She does not check her BP at home.  Asked her to get a cuff and start checking her blood pressure at home.     Osteoporosis without current pathological fracture, unspecified osteoporosis type Assessment & Plan: Dexa 2021 T -2.9 femur neck.  Plan to address issue at next OV.        Return in about 4 weeks (around 09/16/2023) for chronic followup.    Alease Medina, MD

## 2023-08-19 NOTE — Assessment & Plan Note (Signed)
Spinal stenosis that causes neurogenic claudication.  On Lyrica 100mg  BID.  Sitting down stops her pain.  Will try celebrex 100mg  once daily.  Reassess in a month.

## 2023-08-20 LAB — CBC WITH DIFFERENTIAL/PLATELET
Basophils Absolute: 0 10*3/uL (ref 0.0–0.2)
Basos: 0 %
EOS (ABSOLUTE): 0.1 10*3/uL (ref 0.0–0.4)
Eos: 1 %
Hematocrit: 39 % (ref 34.0–46.6)
Hemoglobin: 12.7 g/dL (ref 11.1–15.9)
Immature Grans (Abs): 0 10*3/uL (ref 0.0–0.1)
Immature Granulocytes: 0 %
Lymphocytes Absolute: 2 10*3/uL (ref 0.7–3.1)
Lymphs: 35 %
MCH: 28.9 pg (ref 26.6–33.0)
MCHC: 32.6 g/dL (ref 31.5–35.7)
MCV: 89 fL (ref 79–97)
Monocytes Absolute: 0.5 10*3/uL (ref 0.1–0.9)
Monocytes: 8 %
Neutrophils Absolute: 3.2 10*3/uL (ref 1.4–7.0)
Neutrophils: 56 %
Platelets: 232 10*3/uL (ref 150–450)
RBC: 4.4 x10E6/uL (ref 3.77–5.28)
RDW: 13.5 % (ref 11.7–15.4)
WBC: 5.8 10*3/uL (ref 3.4–10.8)

## 2023-08-20 LAB — TSH+FREE T4
Free T4: 0.91 ng/dL (ref 0.82–1.77)
TSH: 2.48 u[IU]/mL (ref 0.450–4.500)

## 2023-08-20 LAB — CMP14+EGFR
ALT: 12 [IU]/L (ref 0–32)
AST: 17 [IU]/L (ref 0–40)
Albumin: 4.4 g/dL (ref 3.7–4.7)
Alkaline Phosphatase: 96 [IU]/L (ref 44–121)
BUN/Creatinine Ratio: 26 (ref 12–28)
BUN: 20 mg/dL (ref 8–27)
Bilirubin Total: 0.3 mg/dL (ref 0.0–1.2)
CO2: 18 mmol/L — ABNORMAL LOW (ref 20–29)
Calcium: 9.5 mg/dL (ref 8.7–10.3)
Chloride: 106 mmol/L (ref 96–106)
Creatinine, Ser: 0.76 mg/dL (ref 0.57–1.00)
Globulin, Total: 3 g/dL (ref 1.5–4.5)
Glucose: 123 mg/dL — ABNORMAL HIGH (ref 70–99)
Potassium: 4.3 mmol/L (ref 3.5–5.2)
Sodium: 142 mmol/L (ref 134–144)
Total Protein: 7.4 g/dL (ref 6.0–8.5)
eGFR: 76 mL/min/{1.73_m2} (ref >59)

## 2023-08-20 LAB — HEMOGLOBIN A1C
Est. average glucose Bld gHb Est-mCnc: 154 mg/dL
Hgb A1c MFr Bld: 7 % — ABNORMAL HIGH (ref 4.8–5.6)

## 2023-08-20 LAB — VITAMIN D 25 HYDROXY (VIT D DEFICIENCY, FRACTURES): Vit D, 25-Hydroxy: 43.9 ng/mL (ref 30.0–100.0)

## 2023-08-24 ENCOUNTER — Telehealth: Payer: Self-pay

## 2023-08-24 NOTE — Telephone Encounter (Signed)
Spoke to patient and relayed lab results per Dr. Jerald Kief advice. Patient verbalized understanding and had no questions or concerns to express at this time.

## 2023-08-24 NOTE — Telephone Encounter (Signed)
-----   Message from Alease Medina sent at 08/24/2023 10:39 AM EST ----- All of your labs are good.  Your diabetes is under excellent control.  Your liver and kidney functions are good.  Your cell counts are good.

## 2023-08-30 DIAGNOSIS — L400 Psoriasis vulgaris: Secondary | ICD-10-CM | POA: Diagnosis not present

## 2023-08-30 DIAGNOSIS — Z79899 Other long term (current) drug therapy: Secondary | ICD-10-CM | POA: Diagnosis not present

## 2023-09-16 ENCOUNTER — Ambulatory Visit (INDEPENDENT_AMBULATORY_CARE_PROVIDER_SITE_OTHER): Payer: Medicare PPO | Admitting: Family Medicine

## 2023-09-16 ENCOUNTER — Encounter: Payer: Self-pay | Admitting: Family Medicine

## 2023-09-16 VITALS — BP 137/81 | HR 80 | Temp 97.3°F | Resp 14 | Ht 66.0 in | Wt 161.8 lb

## 2023-09-16 DIAGNOSIS — M810 Age-related osteoporosis without current pathological fracture: Secondary | ICD-10-CM | POA: Diagnosis not present

## 2023-09-16 DIAGNOSIS — M06012 Rheumatoid arthritis without rheumatoid factor, left shoulder: Secondary | ICD-10-CM | POA: Diagnosis not present

## 2023-09-16 DIAGNOSIS — M48061 Spinal stenosis, lumbar region without neurogenic claudication: Secondary | ICD-10-CM

## 2023-09-16 DIAGNOSIS — M06011 Rheumatoid arthritis without rheumatoid factor, right shoulder: Secondary | ICD-10-CM | POA: Insufficient documentation

## 2023-09-16 DIAGNOSIS — R030 Elevated blood-pressure reading, without diagnosis of hypertension: Secondary | ICD-10-CM | POA: Diagnosis not present

## 2023-09-16 MED ORDER — CELECOXIB 50 MG PO CAPS: 50.0000 mg | ORAL_CAPSULE | Freq: Every day | ORAL | 1 refills | Status: AC | PRN

## 2023-09-16 NOTE — Assessment & Plan Note (Signed)
She takes Tylenol 1000 mg 3 times a day.  She responded well to Celebrex 100 mg daily.  Checking her renal function today.  Switched her to Celebrex 50 mg daily because it may be safer.  See me back in a month to discuss this.

## 2023-09-16 NOTE — Assessment & Plan Note (Signed)
Encouraging her to use her rollator.  If she is in forward flexion at the waist she will have as much pain.  She can always sit down to stop the pain as well.

## 2023-09-16 NOTE — Progress Notes (Signed)
Established Patient Office Visit  Subjective   Patient ID: Jordan Montoya, female    DOB: 1933/11/28  Age: 89 y.o. MRN: [id number]  Chief Complaint  Patient presents with   Medical Management of Chronic Issues    HPI 89 year old with spinal stenosis and dense peripheral neuropathy.  She takes Tylenol Extra Strength 1000 mg capsules 3 times a day she is on Lyrica 100 mg twice daily she took Celebrex 100 mg for the last month and had a remarkable improvement in her pain.  She denies any stomach ache, swelling of her feet, chest pains, shortness of breath, PND, orthopnea.  The Celebrex helps with pain in her hands and wrist as well.  She has bilateral shoulder pain and it feels better with Celebrex but she is will has pain.  No numbness or tingling in her hands.  No shooting pains going down her arms. Blood pressure initially 165/80.  Let her sit for a while and rest and her blood pressure improved to 137/81. She had a DEXA scan 06/02/2020 and her hip T-score was -2.9 she has not broken a bone.  She takes vitamin D daily.    ROS    Objective:     BP 137/81 (BP Location: Right Arm, Patient Position: Sitting, Cuff Size: Normal)   Pulse 80   Temp (!) 97.3 F (36.3 C) (Oral)   Resp 14   Ht 5\' 6"  (1.676 m) Comment: per chart  Wt 161 lb 12.8 oz (73.4 kg)   SpO2 95%   BMI 26.12 kg/m    Physical Exam Vitals and nursing note reviewed.  Constitutional:      Appearance: Normal appearance.  HENT:     Head: Normocephalic and atraumatic.  Eyes:     Conjunctiva/sclera: Conjunctivae normal.  Cardiovascular:     Rate and Rhythm: Normal rate and regular rhythm.  Pulmonary:     Effort: Pulmonary effort is normal.     Breath sounds: Normal breath sounds.  Musculoskeletal:     Right lower leg: No edema.     Left lower leg: No edema.  Skin:    General: Skin is warm and dry.  Neurological:     Mental Status: She is alert and oriented to person, place, and time.  Psychiatric:         Mood and Affect: Mood normal.        Behavior: Behavior normal.        Thought Content: Thought content normal.        Judgment: Judgment normal.      No results found for any visits on 09/16/23.    The ASCVD Risk score (Arnett DK, et al., 2019) failed to calculate for the following reasons:   The 2019 ASCVD risk score is only valid for ages 20 to 53    Assessment & Plan:   Problem List Items Addressed This Visit       Musculoskeletal and Integument   Osteoporosis   Will repeat her bone density scan.  Not certain she is a good candidate for bisphosphonates.  Likely do better on denosumab.      Rheumatoid arthritis involving both shoulders with negative rheumatoid factor (HCC) - Primary   She takes Tylenol 1000 mg 3 times a day.  She responded well to Celebrex 100 mg daily.  Checking her renal function today.  Switched her to Celebrex 50 mg daily because it may be safer.  See me back in a month to discuss this.  Relevant Medications   celecoxib (CELEBREX) 50 MG capsule     Other   Spinal stenosis of lumbar region at multiple levels (Chronic)   Encouraging her to use her rollator.  If she is in forward flexion at the waist she will have as much pain.  She can always sit down to stop the pain as well.      Elevated blood pressure reading   She does not appear to have high blood pressure.  She does not monitor her pressures at home.      Relevant Orders   CMP14+EGFR    Return in about 4 weeks (around 10/14/2023).    Alease Medina, MD

## 2023-09-16 NOTE — Assessment & Plan Note (Signed)
She does not appear to have high blood pressure.  She does not monitor her pressures at home.

## 2023-09-16 NOTE — Assessment & Plan Note (Signed)
Will repeat her bone density scan.  Not certain she is a good candidate for bisphosphonates.  Likely do better on denosumab.

## 2023-09-16 NOTE — Patient Instructions (Signed)
HAPPPPPYYYY BIRTHDAY!!!!! (A little early!)

## 2023-09-17 LAB — CMP14+EGFR
ALT: 13 [IU]/L (ref 0–32)
AST: 19 [IU]/L (ref 0–40)
Albumin: 4.6 g/dL (ref 3.7–4.7)
Alkaline Phosphatase: 86 [IU]/L (ref 44–121)
BUN/Creatinine Ratio: 20 (ref 12–28)
BUN: 18 mg/dL (ref 8–27)
Bilirubin Total: 0.3 mg/dL (ref 0.0–1.2)
CO2: 21 mmol/L (ref 20–29)
Calcium: 9.4 mg/dL (ref 8.7–10.3)
Chloride: 104 mmol/L (ref 96–106)
Creatinine, Ser: 0.9 mg/dL (ref 0.57–1.00)
Globulin, Total: 2.8 g/dL (ref 1.5–4.5)
Glucose: 98 mg/dL (ref 70–99)
Potassium: 4.4 mmol/L (ref 3.5–5.2)
Sodium: 140 mmol/L (ref 134–144)
Total Protein: 7.4 g/dL (ref 6.0–8.5)
eGFR: 62 mL/min/{1.73_m2} (ref >59)

## 2023-09-28 ENCOUNTER — Other Ambulatory Visit: Payer: Self-pay

## 2023-09-28 DIAGNOSIS — E118 Type 2 diabetes mellitus with unspecified complications: Secondary | ICD-10-CM

## 2023-09-28 MED ORDER — SITAGLIPTIN PHOSPHATE 100 MG PO TABS
100.0000 mg | ORAL_TABLET | Freq: Every day | ORAL | 0 refills | Status: DC
Start: 2023-09-28 — End: 2023-12-30

## 2023-10-14 ENCOUNTER — Ambulatory Visit: Payer: Medicare PPO | Admitting: Family Medicine

## 2023-10-14 ENCOUNTER — Encounter: Payer: Self-pay | Admitting: Family Medicine

## 2023-10-14 VITALS — BP 133/77 | HR 84 | Temp 97.6°F | Resp 18 | Ht 66.0 in | Wt 161.0 lb

## 2023-10-14 DIAGNOSIS — M48062 Spinal stenosis, lumbar region with neurogenic claudication: Secondary | ICD-10-CM

## 2023-10-14 NOTE — Assessment & Plan Note (Addendum)
 Her BP does not appear to be elevated fro the celebrex.  She can take celebrex 50mg  no more than every other day.  She has a few tramadol for emergencies.  Please continue tylenol 500mg  2 BID.  Advised she can increase tylenol to TID.  May take celebrx and tylenol together or tylenol and Tramadol together.  Do not take tramadol and celebrex in the same day.

## 2023-10-14 NOTE — Progress Notes (Signed)
   Established Patient Office Visit  Subjective   Patient ID: Jordan Montoya, female    DOB: 16-Oct-1933  Age: 89 y.o. MRN: [id number]  Chief Complaint  Patient presents with   Rheumatoid Arthritis    HPI She has rheumatoid arthritis, psoriatic arthritis and spinal stenosis.  Pain is an everyday problem.  She has pain every day mostly in her lower back and hips but she has pain in other joints as well.   She tried Celebrex 50 mg and she does get good relief.  No stomach irritation, pain or reflux.  No peripheral edema.  On the days when she is at home and can rest when she needs to she takes 2 Tylenol in the morning and 2 Tylenol in the evening but if she has to go out or has a lot to do she either takes her Celebrex 50 mg or tramadol 50 mg tramadol is more effective for her than Celebrex 50 mg.  She gets 15 tramadol a month.   She took a celebrex today and her BP is normal at 133/77. ROS    Objective:     BP 133/77 (BP Location: Right Arm, Patient Position: Sitting, Cuff Size: Normal)   Pulse 84   Temp 97.6 F (36.4 C) (Oral)   Resp 18   Ht 5\' 6"  (1.676 m)   Wt 161 lb (73 kg)   SpO2 94%   BMI 25.99 kg/m    Physical Exam Vitals and nursing note reviewed.  Constitutional:      Appearance: Normal appearance.  HENT:     Head: Normocephalic and atraumatic.  Eyes:     Conjunctiva/sclera: Conjunctivae normal.  Cardiovascular:     Rate and Rhythm: Normal rate and regular rhythm.  Pulmonary:     Effort: Pulmonary effort is normal.     Breath sounds: Normal breath sounds.  Musculoskeletal:     Right lower leg: No edema.     Left lower leg: No edema.  Skin:    General: Skin is warm and dry.  Neurological:     Mental Status: She is alert and oriented to person, place, and time.  Psychiatric:        Mood and Affect: Mood normal.        Behavior: Behavior normal.        Thought Content: Thought content normal.        Judgment: Judgment normal.          No results  found for any visits on 10/14/23.    The ASCVD Risk score (Arnett DK, et al., 2019) failed to calculate for the following reasons:   The 2019 ASCVD risk score is only valid for ages 60 to 66    Assessment & Plan:  Spinal stenosis of lumbar region with neurogenic claudication Assessment & Plan: She can take celebrex 50mg  no more than every other day.  She has a few tramadol for emergencies.  Please continue tylenol 500mg  2 BID.  Advised she can increase tylenol to TID.  May take celebrx and tylenol together or tylenol and Tramadol together.  Do not take tramadol and celebrex in the same day.        Return in about 3 months (around 01/14/2024) for A1c.    Alease Medina, MD

## 2023-12-19 ENCOUNTER — Encounter: Payer: Self-pay | Admitting: Urology

## 2023-12-26 ENCOUNTER — Other Ambulatory Visit: Payer: Self-pay | Admitting: Family Medicine

## 2023-12-26 DIAGNOSIS — M48061 Spinal stenosis, lumbar region without neurogenic claudication: Secondary | ICD-10-CM

## 2023-12-26 DIAGNOSIS — B0229 Other postherpetic nervous system involvement: Secondary | ICD-10-CM

## 2023-12-26 DIAGNOSIS — E118 Type 2 diabetes mellitus with unspecified complications: Secondary | ICD-10-CM

## 2023-12-29 ENCOUNTER — Telehealth: Payer: Self-pay | Admitting: Family Medicine

## 2023-12-29 NOTE — Telephone Encounter (Signed)
 Copied from CRM [id number]. Topic: Clinical - Prescription Issue >> Dec 29, 2023 11:06 AM Dorthula Gavel H wrote: Reason for CRM: pt is wanting to know when her refills will be sent to the pharmacy. Pharmacy called pt and told her to call the office because they sent over requests. JANUVIA  100 MG tablet  pregabalin  (LYRICA ) 100 MG capsule traMADol  (ULTRAM ) 50 MG tablet

## 2023-12-30 ENCOUNTER — Other Ambulatory Visit: Payer: Self-pay | Admitting: Family Medicine

## 2023-12-30 DIAGNOSIS — M48061 Spinal stenosis, lumbar region without neurogenic claudication: Secondary | ICD-10-CM

## 2023-12-30 DIAGNOSIS — B0229 Other postherpetic nervous system involvement: Secondary | ICD-10-CM

## 2023-12-30 DIAGNOSIS — E118 Type 2 diabetes mellitus with unspecified complications: Secondary | ICD-10-CM

## 2023-12-30 NOTE — Telephone Encounter (Signed)
 Pt is completely out of her medication, she is asking to have these filled today. Please advise

## 2023-12-30 NOTE — Telephone Encounter (Signed)
 Copied from CRM [id number]. Topic: Clinical - Medication Refill >> Dec 30, 2023  4:36 PM Sophia H wrote: Medication: sitaGLIPtin  (JANUVIA ) 100 MG tablet pregabalin  (LYRICA ) 100 MG capsule traMADol  (ULTRAM ) 50 MG tablet  Has the patient contacted their pharmacy? Yes (Agent: If no, request that the patient contact the pharmacy for the refill. If patient does not wish to contact the pharmacy document the reason why and proceed with request.) (Agent: If yes, when and what did the pharmacy advise?)  This is the patient's preferred pharmacy:  [phone], [place] - [address] [place] [address] Phone: [phone] Fax: [phone]  Is this the correct pharmacy for this prescription? Yes If no, delete pharmacy and type the correct one.   Has the prescription been filled recently? Yes  Is the patient out of the medication? Yes, been out of diabetes meds for 3 days  Has the patient been seen for an appointment in the last year OR does the patient have an upcoming appointment? Yes  Can we respond through MyChart? No,patient prefers phone call  Agent: Please be advised that Rx refills may take up to 3 business days. We ask that you follow-up with your pharmacy.

## 2024-01-01 ENCOUNTER — Other Ambulatory Visit: Payer: Self-pay | Admitting: Physician Assistant

## 2024-01-01 DIAGNOSIS — B0229 Other postherpetic nervous system involvement: Secondary | ICD-10-CM

## 2024-01-01 DIAGNOSIS — M48061 Spinal stenosis, lumbar region without neurogenic claudication: Secondary | ICD-10-CM

## 2024-01-01 MED ORDER — TRAMADOL HCL 50 MG PO TABS: 50.0000 mg | ORAL_TABLET | Freq: Two times a day (BID) | ORAL | 0 refills | Status: DC | PRN

## 2024-01-01 MED ORDER — PREGABALIN 100 MG PO CAPS: 100.0000 mg | ORAL_CAPSULE | Freq: Two times a day (BID) | ORAL | 0 refills | Status: DC

## 2024-01-01 NOTE — Progress Notes (Signed)
 Called by [facility]- for medication refills of tramadol , lyrica , januvia .  Januvia  called in by [facility] .Aaron AasPDMP reviewed during this encounter. Last lyrica  fill 10/07/23 Last tramadol  fill 08/19/23 She has been seen within the last 3 months Sent lyrica  for one month and 15 tramadol  Follow up with PCP.

## 2024-01-02 ENCOUNTER — Other Ambulatory Visit: Payer: Self-pay | Admitting: Family Medicine

## 2024-01-02 DIAGNOSIS — E118 Type 2 diabetes mellitus with unspecified complications: Secondary | ICD-10-CM

## 2024-01-02 MED ORDER — SITAGLIPTIN PHOSPHATE 100 MG PO TABS: 100.0000 mg | ORAL_TABLET | Freq: Every day | ORAL | 1 refills | Status: DC

## 2024-01-02 MED ORDER — SITAGLIPTIN PHOSPHATE 100 MG PO TABS: 100.0000 mg | ORAL_TABLET | Freq: Every day | ORAL | 0 refills | Status: DC

## 2024-01-02 NOTE — Telephone Encounter (Signed)
 Patient states that she now has her medications and was informed by pharmacy that it is too soon to refill Lyrica . Patient was encouraged to contact pharmacy and or provider's office at least 2 weeks before she runs out of medication. All questions and concerns have been addressed.

## 2024-01-13 ENCOUNTER — Telehealth: Payer: Self-pay

## 2024-01-13 ENCOUNTER — Ambulatory Visit: Admitting: Family Medicine

## 2024-01-13 ENCOUNTER — Encounter: Payer: Self-pay | Admitting: Family Medicine

## 2024-01-13 VITALS — BP 127/75 | HR 77 | Temp 98.1°F | Resp 18 | Ht 66.0 in | Wt 154.0 lb

## 2024-01-13 DIAGNOSIS — E1169 Type 2 diabetes mellitus with other specified complication: Secondary | ICD-10-CM | POA: Diagnosis not present

## 2024-01-13 DIAGNOSIS — F411 Generalized anxiety disorder: Secondary | ICD-10-CM

## 2024-01-13 DIAGNOSIS — E782 Mixed hyperlipidemia: Secondary | ICD-10-CM

## 2024-01-13 DIAGNOSIS — E118 Type 2 diabetes mellitus with unspecified complications: Secondary | ICD-10-CM

## 2024-01-13 DIAGNOSIS — G6289 Other specified polyneuropathies: Secondary | ICD-10-CM

## 2024-01-13 DIAGNOSIS — Z7984 Long term (current) use of oral hypoglycemic drugs: Secondary | ICD-10-CM

## 2024-01-13 DIAGNOSIS — M48062 Spinal stenosis, lumbar region with neurogenic claudication: Secondary | ICD-10-CM

## 2024-01-13 DIAGNOSIS — M545 Low back pain, unspecified: Secondary | ICD-10-CM

## 2024-01-13 DIAGNOSIS — E785 Hyperlipidemia, unspecified: Secondary | ICD-10-CM

## 2024-01-13 LAB — POCT GLYCOSYLATED HEMOGLOBIN (HGB A1C): Hemoglobin A1C: 7.1 % — AB (ref 4.0–5.6)

## 2024-01-13 MED ORDER — TRAMADOL HCL 50 MG PO TABS: 50.0000 mg | ORAL_TABLET | Freq: Two times a day (BID) | ORAL | 0 refills | Status: DC | PRN

## 2024-01-13 MED ORDER — TRAMADOL HCL 50 MG PO TABS: 50.0000 mg | ORAL_TABLET | Freq: Three times a day (TID) | ORAL | 0 refills | Status: AC | PRN

## 2024-01-13 MED ORDER — ALPRAZOLAM 0.25 MG PO TABS: 0.2500 mg | ORAL_TABLET | Freq: Every day | ORAL | 1 refills | Status: DC | PRN

## 2024-01-13 MED ORDER — TRAMADOL HCL 50 MG PO TABS
50.0000 mg | ORAL_TABLET | Freq: Three times a day (TID) | ORAL | 0 refills | Status: AC | PRN
Start: 2024-02-10 — End: 2024-02-15

## 2024-01-13 MED ORDER — PREGABALIN 100 MG PO CAPS: 100.0000 mg | ORAL_CAPSULE | Freq: Two times a day (BID) | ORAL | 0 refills | Status: DC

## 2024-01-13 MED ORDER — ATORVASTATIN CALCIUM 10 MG PO TABS
10.0000 mg | ORAL_TABLET | Freq: Every day | ORAL | 3 refills | Status: AC
Start: 2024-01-13 — End: ?

## 2024-01-13 NOTE — Assessment & Plan Note (Signed)
 A1c is 7.1% which is adequate control for her age.  She controls her diabetes with diet and Januvia  100 mg daily.  She is not a candidate for GLP-1 because her BMI is 24.

## 2024-01-13 NOTE — Progress Notes (Signed)
 Established Patient Office Visit  Subjective   Patient ID: Jordan Montoya, female    DOB: April 29, 1934  Age: 89 y.o. MRN: [id number]  Chief Complaint  Patient presents with   Medical Management of Chronic Issues    HPI Delightful 89 year old woman with rheumatoid arthritis, psoriatic arthritis, spinal stenosis, anxiety, HTN, mixed hyperlipidemia, DMT2, osteoporosis and vitamin D  deficiency. She reports that she had a steroid injection in her hip on June 5 but so far it has not relieved any pain.  She takes Tylenol  651 twice a day or Tylenol  500 to twice a day.  She also has tramadol  and Celebrex  50 mg.  She saves the Celebrex  for when she is having severe pain and does not take it routinely.  She breaks the tramadol  in half while and this is adequate pain medicine for her.  She is asked for a refill of Lyrica  for 90 days.  She is getting tramadol  50 mg #15/month.  She reports that she has pain all the time especially walking.  Her back hurts all the time. The only medicine she takes for type 2 diabetes is Januvia  100 mg daily.  A1c today is 7.1%. She has for refill of Xanax  0.25 last had this filled July 2024.  She takes a half of the Xanax  0.25 if she has insomnia. She complains of being tired.  She drinks water and feels she is getting enough hydration.      ROS    Objective:     BP 127/75 (BP Location: Right Arm, Patient Position: Sitting, Cuff Size: Normal)   Pulse 77   Temp 98.1 F (36.7 C) (Oral)   Resp 18   Ht 5' 6 (1.676 m)   Wt 154 lb (69.9 kg)   SpO2 94%   BMI 24.86 kg/m    Physical Exam Vitals and nursing note reviewed.  Constitutional:      Appearance: Normal appearance.  HENT:     Head: Normocephalic and atraumatic.   Eyes:     Conjunctiva/sclera: Conjunctivae normal.    Cardiovascular:     Rate and Rhythm: Normal rate and regular rhythm.  Pulmonary:     Effort: Pulmonary effort is normal.     Breath sounds: Normal breath sounds.    Musculoskeletal:     Right lower leg: No edema.     Left lower leg: No edema.   Skin:    General: Skin is warm and dry.   Neurological:     Mental Status: She is alert and oriented to person, place, and time.   Psychiatric:        Mood and Affect: Mood normal.        Behavior: Behavior normal.        Thought Content: Thought content normal.        Judgment: Judgment normal.          Results for orders placed or performed in visit on 01/13/24  POCT glycosylated hemoglobin (Hb A1C)  Result Value Ref Range   Hemoglobin A1C 7.1 (A) 4.0 - 5.6 %   HbA1c POC (<> result, manual entry)     HbA1c, POC (prediabetic range)     HbA1c, POC (controlled diabetic range)        The ASCVD Risk score (Arnett DK, et al., 2019) failed to calculate for the following reasons:   The 2019 ASCVD risk score is only valid for ages 52 to 37    Assessment & Plan:  Type II diabetes mellitus  with complication [facility]) Assessment & Plan: A1c is 7.1% which is adequate control for her age.  She controls her diabetes with diet and Januvia  100 mg daily.  She is not a candidate for GLP-1 because her BMI is 24.  Orders: -     POCT glycosylated hemoglobin (Hb A1C)  Generalized anxiety disorder Assessment & Plan: Her last fill of Xanax  was July 2024 for 30 pills.  Refill this for her today.  She takes half a tablet when she has insomnia.  Orders: -     ALPRAZolam ; Take 1 tablet (0.25 mg total) by mouth daily as needed. for anxiety  Dispense: 30 tablet; Refill: 1  Other polyneuropathy -     Pregabalin ; Take 1 capsule (100 mg total) by mouth 2 (two) times daily.  Dispense: 180 capsule; Refill: 0  Mixed hyperlipidemia -     Atorvastatin Calcium; Take 1 tablet (10 mg total) by mouth daily.  Dispense: 90 tablet; Refill: 3  Lumbar back pain -     traMADol  HCl; Take 1 tablet (50 mg total) by mouth every 12 (twelve) hours as needed for moderate pain (pain score 4-6).  Dispense: 15 tablet; Refill: 0 -      traMADol  HCl; Take 1 tablet (50 mg total) by mouth every 8 (eight) hours as needed for up to 5 days.  Dispense: 15 tablet; Refill: 0 -     traMADol  HCl; Take 1 tablet (50 mg total) by mouth every 8 (eight) hours as needed for up to 5 days.  Dispense: 15 tablet; Refill: 0  Spinal stenosis of lumbar region with neurogenic claudication Assessment & Plan: She reserves Celebrex  50 mg for her worst days.  She also has tramadol  50 mg and she gets 15 a month.  Routinely she takes Tylenol  651 twice a day or Tylenol  500 mg 2 twice a day.  She asked for a refill of her Lyrica  today and would like a 45-month supply.   Hyperlipidemia associated with type 2 diabetes mellitus [facility]) Assessment & Plan: 04/18/2023 total cholesterol 171, Triggs 169, HDL 57 and LDL 85 ask her to start Lipitor 10 mg daily with a goal in mind of LDL 70 or less as she has diabetes.      Return in about 3 months (around 04/14/2024).    Franchesca Veneziano K Kahliya Fraleigh, MD

## 2024-01-13 NOTE — Assessment & Plan Note (Signed)
 04/18/2023 total cholesterol 171, Triggs 169, HDL 57 and LDL 85 ask her to start Lipitor 10 mg daily with a goal in mind of LDL 70 or less as she has diabetes.

## 2024-01-13 NOTE — Assessment & Plan Note (Signed)
 Her last fill of Xanax  was July 2024 for 30 pills.  Refill this for her today.  She takes half a tablet when she has insomnia.

## 2024-01-13 NOTE — Telephone Encounter (Signed)
(  Key: [id number])  PA Case ID #: [id number]   Status Sent to Plan today  Drug Xanax  0.25MG  tablets  Form [employer] Electronic PA Form

## 2024-01-13 NOTE — Assessment & Plan Note (Signed)
 She reserves Celebrex  50 mg for her worst days.  She also has tramadol  50 mg and she gets 15 a month.  Routinely she takes Tylenol  651 twice a day or Tylenol  500 mg 2 twice a day.  She asked for a refill of her Lyrica  today and would like a 22-month supply.

## 2024-03-01 ENCOUNTER — Encounter: Payer: Self-pay | Admitting: Urology

## 2024-03-05 ENCOUNTER — Encounter: Payer: Self-pay | Admitting: Urology

## 2024-03-12 DIAGNOSIS — M5442 Lumbago with sciatica, left side: Secondary | ICD-10-CM | POA: Diagnosis not present

## 2024-03-12 DIAGNOSIS — M25552 Pain in left hip: Secondary | ICD-10-CM | POA: Diagnosis not present

## 2024-03-12 DIAGNOSIS — M7062 Trochanteric bursitis, left hip: Secondary | ICD-10-CM | POA: Diagnosis not present

## 2024-03-12 DIAGNOSIS — M5441 Lumbago with sciatica, right side: Secondary | ICD-10-CM | POA: Diagnosis not present

## 2024-03-12 DIAGNOSIS — G8929 Other chronic pain: Secondary | ICD-10-CM | POA: Diagnosis not present

## 2024-03-12 DIAGNOSIS — M5416 Radiculopathy, lumbar region: Secondary | ICD-10-CM | POA: Diagnosis not present

## 2024-03-19 ENCOUNTER — Telehealth: Payer: Self-pay | Admitting: Family Medicine

## 2024-03-19 ENCOUNTER — Ambulatory Visit: Admitting: Urology

## 2024-03-19 ENCOUNTER — Ambulatory Visit: Payer: Self-pay

## 2024-03-19 ENCOUNTER — Ambulatory Visit
Admission: RE | Admit: 2024-03-19 | Discharge: 2024-03-19 | Disposition: A | Discharge status: Home / Self Care | Attending: Family Medicine | Admitting: Family Medicine

## 2024-03-19 ENCOUNTER — Ambulatory Visit
Admission: RE | Admit: 2024-03-19 | Discharge: 2024-03-19 | Disposition: A | Discharge status: Home / Self Care | Source: Ambulatory Visit | Attending: Family Medicine | Admitting: Family Medicine

## 2024-03-19 ENCOUNTER — Ambulatory Visit: Admitting: Family Medicine

## 2024-03-19 ENCOUNTER — Encounter: Payer: Self-pay | Admitting: Family Medicine

## 2024-03-19 VITALS — BP 150/77 | HR 98 | Temp 98.3°F | Resp 18 | Ht 66.0 in | Wt 161.0 lb

## 2024-03-19 DIAGNOSIS — M545 Low back pain, unspecified: Secondary | ICD-10-CM | POA: Diagnosis not present

## 2024-03-19 DIAGNOSIS — R000 Tachycardia, unspecified: Secondary | ICD-10-CM

## 2024-03-19 DIAGNOSIS — R0602 Shortness of breath: Secondary | ICD-10-CM

## 2024-03-19 DIAGNOSIS — G6289 Other specified polyneuropathies: Secondary | ICD-10-CM

## 2024-03-19 DIAGNOSIS — R0609 Other forms of dyspnea: Secondary | ICD-10-CM | POA: Diagnosis not present

## 2024-03-19 MED ORDER — TRAMADOL HCL 50 MG PO TABS: 50.0000 mg | ORAL_TABLET | Freq: Two times a day (BID) | ORAL | 0 refills | Status: DC | PRN

## 2024-03-19 MED ORDER — PREGABALIN 100 MG PO CAPS: 100.0000 mg | ORAL_CAPSULE | Freq: Two times a day (BID) | ORAL | 0 refills | Status: AC

## 2024-03-19 NOTE — Telephone Encounter (Signed)
 Called home number.  No answer and no voice mail set up.

## 2024-03-19 NOTE — Progress Notes (Unsigned)
 Established Patient Office Visit  Subjective   Patient ID: Jordan Montoya, female    DOB: 1933/10/22  Age: 89 y.o. MRN: [id number]  Chief Complaint  Patient presents with  . Shortness of Breath    HPI Delightful 89 year old woman with rheumatoid arthritis, psoriatic arthritis, spinal stenosis, anxiety, HTN, mixed hyperlipidemia, DMT2, osteoporosis and vitamin D  deficiency.  Discussed the use of AI scribe software for clinical note transcription with the patient, who gave verbal consent to proceed.  History of Present Illness   Jordan Montoya is an 89 year old female who presents with episodes of rapid heart rate and shortness of breath.  She has been experiencing episodes of rapid heart rate and shortness of breath since Saturday. Her son noticed she appeared unwell during lunch, and later that evening, her friends observed her breathing rapidly. Her pulse was recorded at 98 beats per minute throughout the night using a smartwatch. She experiences shortness of breath with minimal exertion, such as walking to the bathroom or brushing her teeth, which causes her heart rate to exceed 100 beats per minute. No chest pain, jaw pain, neck pain, or pain in her left arm.  She denies a cough, wheezing, PND or orthopnea and has trace pedal edema bilaterally.  She describes herself as an anxious person and mentions recent stressors, including her granddaughter's miscarriage and her son's cancer diagnosis, which have contributed to her anxiety. She lives alone and feels increasingly anxious.  She reports sinus drainage that causes her to cough when it irritates her throat. She notes mild swelling in her feet, which her neighbor confirms as being slightly puffy. She elevates her feet as they tend to dangle when she sits. Oxygen saturation recorded at 91-92% in office today.       ROS    Objective:     BP (!) 146/67   Pulse 96   Temp 98.3 F (36.8 C) (Oral)   Resp 18   Ht 5' 6  (1.676 m)   Wt 161 lb (73 kg)   SpO2 91%   BMI 25.99 kg/m  {Vitals History (Optional):23777}  Physical Exam Vitals and nursing note reviewed.  Constitutional:      Appearance: Normal appearance.  HENT:     Head: Normocephalic and atraumatic.  Eyes:     Conjunctiva/sclera: Conjunctivae normal.  Cardiovascular:     Rate and Rhythm: Normal rate and regular rhythm.  Pulmonary:     Effort: Tachypnea present. No accessory muscle usage or respiratory distress.     Breath sounds: Examination of the right-lower field reveals rhonchi. Rhonchi present.  Musculoskeletal:     Right lower leg: No edema.     Left lower leg: No edema.  Skin:    General: Skin is warm and dry.  Neurological:     Mental Status: She is alert and oriented to person, place, and time.  Psychiatric:        Mood and Affect: Mood normal.        Behavior: Behavior normal.        Thought Content: Thought content normal.        Judgment: Judgment normal.      {Perform Simple Foot Exam  Perform Detailed exam:1} {Insert foot Exam (Optional):30965}   No results found for any visits on 03/19/24.  {Labs (Optional):23779}  The ASCVD Risk score (Arnett DK, et al., 2019) failed to calculate for the following reasons:   The 2019 ASCVD risk score is only valid for ages 34  to 29    Assessment & Plan:  Other polyneuropathy  Lumbar back pain     No follow-ups on file.    Taijah Macrae K Darly Massi, MD

## 2024-03-19 NOTE — Telephone Encounter (Signed)
 FYI Only or Action Required?: FYI only for provider.  Patient was last seen in primary care on 01/13/2024 by Ziglar, Susan K, MD.  Called Nurse Triage reporting Shortness of Breath.  Symptoms began several days ago.  Interventions attempted: Rest, hydration, or home remedies.  Symptoms are: gradually worsening.  Triage Disposition: See HCP Within 4 Hours (Or PCP Triage)  Patient/caregiver understands and will follow disposition?: Yes  Copied from CRM [id number]. Topic: Clinical - Red Word Triage >> Mar 19, 2024  8:51 AM Henretta I wrote: Red Word that prompted transfer to Nurse Triage:Patients neighbor Mrs. Leita Molt called in because patient is having shortness of breath, fatigue, high blood pressure, and pulse was at 87 while eating breakfast, never goes down. On Saturday it was 98 for 4 hours and never dropped. Reason for Disposition  [1] MILD difficulty breathing (e.g., minimal/no SOB at rest, SOB with walking, pulse < 100) AND [2] NEW-onset or WORSE than normal  Answer Assessment - Initial Assessment Questions 1. RESPIRATORY STATUS: Describe your breathing? (e.g., wheezing, shortness of breath, unable to speak, severe coughing)      Shortness of breath  2. ONSET: When did this breathing problem begin?      2 days   3. PATTERN Does the difficult breathing come and go, or has it been constant since it started?      Constant  4. SEVERITY: How bad is your breathing? (e.g., mild, moderate, severe)      Mild, usually happens when gets up and walking to bathroom, per neighbor patient is mouth breath  5. RECURRENT SYMPTOM: Have you had difficulty breathing before? If Yes, ask: When was the last time? and What happened that time?      No  6. CARDIAC HISTORY: Do you have any history of heart disease? (e.g., heart attack, angina, bypass surgery, angioplasty)      No, but has family history   7. LUNG HISTORY: Do you have any history of lung disease?  (e.g., pulmonary  embolus, asthma, emphysema)     Former smoker  8. CAUSE: What do you think is causing the breathing problem?      Unsure of cause  9. OTHER SYMPTOMS: Do you have any other symptoms? (e.g., chest pain, cough, dizziness, fever, runny nose)     Fatigue  10. O2 SATURATION MONITOR:  Do you use an oxygen saturation monitor (pulse oximeter) at home? If Yes, ask: What is your reading (oxygen level) today? What is your usual oxygen saturation reading? (e.g., 95%)       Yes, 92% HR 94  11. PREGNANCY: Is there any chance you are pregnant? When was your last menstrual period?       No  12. TRAVEL: Have you traveled out of the country in the last month? (e.g., travel history, exposures)       No  Protocols used: Breathing Difficulty-A-AH

## 2024-03-19 NOTE — Telephone Encounter (Signed)
 Discussed the findings of her CXR.  Advised that she could see pulmonology about he emphysema.  She will think about this.  She is to call and let me know what she decides.

## 2024-03-20 ENCOUNTER — Telehealth: Payer: Self-pay | Admitting: Family Medicine

## 2024-03-20 ENCOUNTER — Other Ambulatory Visit: Payer: Self-pay | Admitting: Family Medicine

## 2024-03-20 DIAGNOSIS — J439 Emphysema, unspecified: Secondary | ICD-10-CM

## 2024-03-20 LAB — CBC WITH DIFFERENTIAL/PLATELET
Basophils Absolute: 0 x10E3/uL (ref 0.0–0.2)
Basos: 0 %
EOS (ABSOLUTE): 0.2 x10E3/uL (ref 0.0–0.4)
Eos: 4 %
Hematocrit: 38.8 % (ref 34.0–46.6)
Hemoglobin: 12.6 g/dL (ref 11.1–15.9)
Immature Grans (Abs): 0 x10E3/uL (ref 0.0–0.1)
Immature Granulocytes: 0 %
Lymphocytes Absolute: 1.3 x10E3/uL (ref 0.7–3.1)
Lymphs: 25 %
MCH: 29.4 pg (ref 26.6–33.0)
MCHC: 32.5 g/dL (ref 31.5–35.7)
MCV: 91 fL (ref 79–97)
Monocytes Absolute: 0.7 x10E3/uL (ref 0.1–0.9)
Monocytes: 13 %
Neutrophils Absolute: 3.2 x10E3/uL (ref 1.4–7.0)
Neutrophils: 57 %
Platelets: 227 x10E3/uL (ref 150–450)
RBC: 4.28 x10E6/uL (ref 3.77–5.28)
RDW: 13.1 % (ref 11.7–15.4)
WBC: 5.5 x10E3/uL (ref 3.4–10.8)

## 2024-03-20 LAB — COMPREHENSIVE METABOLIC PANEL WITH GFR
ALT: 11 IU/L (ref 0–32)
AST: 12 IU/L (ref 0–40)
Albumin: 4.1 g/dL (ref 3.7–4.7)
Alkaline Phosphatase: 108 IU/L (ref 44–121)
BUN/Creatinine Ratio: 24 (ref 12–28)
BUN: 20 mg/dL (ref 8–27)
Bilirubin Total: 0.4 mg/dL (ref 0.0–1.2)
CO2: 21 mmol/L (ref 20–29)
Calcium: 9.3 mg/dL (ref 8.7–10.3)
Chloride: 104 mmol/L (ref 96–106)
Creatinine, Ser: 0.84 mg/dL (ref 0.57–1.00)
Globulin, Total: 2.8 g/dL (ref 1.5–4.5)
Glucose: 171 mg/dL — ABNORMAL HIGH (ref 70–99)
Potassium: 4.7 mmol/L (ref 3.5–5.2)
Sodium: 142 mmol/L (ref 134–144)
Total Protein: 6.9 g/dL (ref 6.0–8.5)
eGFR: 67 mL/min/1.73 (ref >59)

## 2024-03-20 LAB — T4, FREE: Free T4: 1 ng/dL (ref 0.82–1.77)

## 2024-03-20 LAB — TSH: TSH: 1.95 u[IU]/mL (ref 0.450–4.500)

## 2024-03-20 NOTE — Telephone Encounter (Signed)
 Copied from CRM [id number]. Topic: Referral - Question >> Mar 20, 2024 11:29 AM Jayma L wrote: Reason for CRM: patient called in and asking for a callback from pcp , said she spoke to pcp last night after hours and she thought about it she does want a woman pulmonary dr if possible, asking for a callback to give the pcp details though

## 2024-03-20 NOTE — Telephone Encounter (Signed)
 Spoke with patient. She agrees to see a pulmonologist, Dr. Parris ona treatment of emphysema.

## 2024-03-21 LAB — BRAIN NATRIURETIC PEPTIDE: BNP: 11.4 pg/mL (ref 0.0–100.0)

## 2024-03-22 ENCOUNTER — Ambulatory Visit: Payer: Self-pay | Admitting: Family Medicine

## 2024-03-25 DIAGNOSIS — R0902 Hypoxemia: Secondary | ICD-10-CM | POA: Diagnosis not present

## 2024-03-25 DIAGNOSIS — J449 Chronic obstructive pulmonary disease, unspecified: Secondary | ICD-10-CM | POA: Diagnosis not present

## 2024-03-25 DIAGNOSIS — E875 Hyperkalemia: Secondary | ICD-10-CM | POA: Diagnosis not present

## 2024-03-25 DIAGNOSIS — R42 Dizziness and giddiness: Secondary | ICD-10-CM | POA: Diagnosis not present

## 2024-03-25 DIAGNOSIS — M542 Cervicalgia: Secondary | ICD-10-CM | POA: Diagnosis not present

## 2024-03-25 DIAGNOSIS — Z8673 Personal history of transient ischemic attack (TIA), and cerebral infarction without residual deficits: Secondary | ICD-10-CM | POA: Diagnosis not present

## 2024-03-25 DIAGNOSIS — R911 Solitary pulmonary nodule: Secondary | ICD-10-CM | POA: Diagnosis not present

## 2024-03-25 DIAGNOSIS — M4800 Spinal stenosis, site unspecified: Secondary | ICD-10-CM | POA: Diagnosis not present

## 2024-03-25 DIAGNOSIS — R519 Headache, unspecified: Secondary | ICD-10-CM | POA: Diagnosis not present

## 2024-03-25 DIAGNOSIS — R5383 Other fatigue: Secondary | ICD-10-CM | POA: Diagnosis not present

## 2024-03-25 DIAGNOSIS — R918 Other nonspecific abnormal finding of lung field: Secondary | ICD-10-CM | POA: Diagnosis not present

## 2024-03-25 DIAGNOSIS — E119 Type 2 diabetes mellitus without complications: Secondary | ICD-10-CM | POA: Diagnosis not present

## 2024-03-25 DIAGNOSIS — J439 Emphysema, unspecified: Secondary | ICD-10-CM | POA: Diagnosis not present

## 2024-03-25 DIAGNOSIS — M4802 Spinal stenosis, cervical region: Secondary | ICD-10-CM | POA: Diagnosis not present

## 2024-03-25 DIAGNOSIS — F419 Anxiety disorder, unspecified: Secondary | ICD-10-CM | POA: Diagnosis not present

## 2024-03-25 DIAGNOSIS — J9601 Acute respiratory failure with hypoxia: Secondary | ICD-10-CM | POA: Diagnosis not present

## 2024-03-25 DIAGNOSIS — I10 Essential (primary) hypertension: Secondary | ICD-10-CM | POA: Diagnosis not present

## 2024-03-25 DIAGNOSIS — R638 Other symptoms and signs concerning food and fluid intake: Secondary | ICD-10-CM | POA: Diagnosis not present

## 2024-03-25 DIAGNOSIS — M0510 Rheumatoid lung disease with rheumatoid arthritis of unspecified site: Secondary | ICD-10-CM | POA: Diagnosis not present

## 2024-03-25 DIAGNOSIS — J189 Pneumonia, unspecified organism: Secondary | ICD-10-CM | POA: Diagnosis not present

## 2024-03-25 DIAGNOSIS — R0602 Shortness of breath: Secondary | ICD-10-CM | POA: Diagnosis not present

## 2024-03-25 DIAGNOSIS — F32A Depression, unspecified: Secondary | ICD-10-CM | POA: Diagnosis not present

## 2024-03-26 ENCOUNTER — Ambulatory Visit: Admitting: Urology

## 2024-03-26 ENCOUNTER — Telehealth: Payer: Self-pay

## 2024-03-26 ENCOUNTER — Ambulatory Visit: Payer: Self-pay | Admitting: Urology

## 2024-03-26 DIAGNOSIS — E119 Type 2 diabetes mellitus without complications: Secondary | ICD-10-CM | POA: Diagnosis not present

## 2024-03-26 DIAGNOSIS — R638 Other symptoms and signs concerning food and fluid intake: Secondary | ICD-10-CM | POA: Diagnosis not present

## 2024-03-26 DIAGNOSIS — I10 Essential (primary) hypertension: Secondary | ICD-10-CM | POA: Diagnosis not present

## 2024-03-26 DIAGNOSIS — M542 Cervicalgia: Secondary | ICD-10-CM | POA: Diagnosis not present

## 2024-03-26 DIAGNOSIS — R5383 Other fatigue: Secondary | ICD-10-CM | POA: Diagnosis not present

## 2024-03-26 DIAGNOSIS — J9601 Acute respiratory failure with hypoxia: Secondary | ICD-10-CM | POA: Diagnosis not present

## 2024-03-26 DIAGNOSIS — M4802 Spinal stenosis, cervical region: Secondary | ICD-10-CM | POA: Diagnosis not present

## 2024-03-26 NOTE — Telephone Encounter (Signed)
 I was unable to locate a Pulmonary referral in patient's chart.

## 2024-03-26 NOTE — Telephone Encounter (Signed)
 Copied from CRM #[id number]. Topic: Referral - Question >> Mar 26, 2024  4:17 PM Burnard DEL wrote: Reason for CRM: Patients son called in to ask about referral to pulmonary that was suppose to be sent out for him mom a few weeks ago.He called to the office where provider was suppose to send referral and they did not have any referrals for patient.  Jordan Montoya

## 2024-03-27 DIAGNOSIS — M542 Cervicalgia: Secondary | ICD-10-CM | POA: Diagnosis not present

## 2024-03-27 DIAGNOSIS — J9601 Acute respiratory failure with hypoxia: Secondary | ICD-10-CM | POA: Diagnosis not present

## 2024-03-27 DIAGNOSIS — R638 Other symptoms and signs concerning food and fluid intake: Secondary | ICD-10-CM | POA: Diagnosis not present

## 2024-03-27 DIAGNOSIS — I10 Essential (primary) hypertension: Secondary | ICD-10-CM | POA: Diagnosis not present

## 2024-03-27 DIAGNOSIS — R5383 Other fatigue: Secondary | ICD-10-CM | POA: Diagnosis not present

## 2024-03-28 ENCOUNTER — Other Ambulatory Visit: Payer: Self-pay | Admitting: Family Medicine

## 2024-03-28 DIAGNOSIS — T8182XS Emphysema (subcutaneous) resulting from a procedure, sequela: Secondary | ICD-10-CM

## 2024-03-28 DIAGNOSIS — E119 Type 2 diabetes mellitus without complications: Secondary | ICD-10-CM | POA: Diagnosis not present

## 2024-03-28 DIAGNOSIS — J9601 Acute respiratory failure with hypoxia: Secondary | ICD-10-CM | POA: Diagnosis not present

## 2024-03-28 DIAGNOSIS — M542 Cervicalgia: Secondary | ICD-10-CM | POA: Diagnosis not present

## 2024-03-28 DIAGNOSIS — I10 Essential (primary) hypertension: Secondary | ICD-10-CM | POA: Diagnosis not present

## 2024-03-29 DIAGNOSIS — I10 Essential (primary) hypertension: Secondary | ICD-10-CM | POA: Diagnosis not present

## 2024-03-29 DIAGNOSIS — E875 Hyperkalemia: Secondary | ICD-10-CM | POA: Diagnosis not present

## 2024-03-29 DIAGNOSIS — J9601 Acute respiratory failure with hypoxia: Secondary | ICD-10-CM | POA: Diagnosis not present

## 2024-03-29 NOTE — Telephone Encounter (Signed)
 Put in referral for pulmonology

## 2024-03-29 NOTE — Telephone Encounter (Signed)
 Sent in Pulmonology referral

## 2024-03-29 NOTE — Progress Notes (Signed)
 Left vm x2.

## 2024-03-30 ENCOUNTER — Telehealth: Payer: Self-pay

## 2024-03-30 NOTE — Transitions of Care (Post Inpatient/ED Visit) (Signed)
   03/30/2024  Name: Jordan Montoya MRN: [id number] DOB: [birth year]  Today's TOC FU Call Status: Today's TOC FU Call Status:: Successful TOC FU Call Completed TOC FU Call Complete Date: 03/30/24 (spoke with patient who immediately declined call) Patient's Name and Date of Birth confirmed.  Transition Care Management Follow-up Telephone Call Date of Discharge: 03/29/24 Discharge Facility: Other ([facility]) Name of Other ([facility]) [facility]charge Facility: [facility] Type of Discharge: Inpatient Admission Primary Inpatient Discharge Diagnosis:: Acute hypoxic respiratory failure  Shona Prow RN, CCM [place]  VBCI-Population Health RN Care Manager [phone]

## 2024-04-04 ENCOUNTER — Other Ambulatory Visit: Payer: Self-pay | Admitting: Urology

## 2024-04-04 DIAGNOSIS — N3946 Mixed incontinence: Secondary | ICD-10-CM

## 2024-04-04 DIAGNOSIS — N3281 Overactive bladder: Secondary | ICD-10-CM

## 2024-04-13 ENCOUNTER — Encounter: Payer: Self-pay | Admitting: Family Medicine

## 2024-04-13 ENCOUNTER — Ambulatory Visit: Admitting: Family Medicine

## 2024-04-13 VITALS — BP 144/77 | HR 90 | Temp 98.4°F | Resp 18 | Ht 66.0 in | Wt 157.0 lb

## 2024-04-13 DIAGNOSIS — Z7984 Long term (current) use of oral hypoglycemic drugs: Secondary | ICD-10-CM

## 2024-04-13 DIAGNOSIS — M06011 Rheumatoid arthritis without rheumatoid factor, right shoulder: Secondary | ICD-10-CM

## 2024-04-13 DIAGNOSIS — E118 Type 2 diabetes mellitus with unspecified complications: Secondary | ICD-10-CM | POA: Diagnosis not present

## 2024-04-13 DIAGNOSIS — F411 Generalized anxiety disorder: Secondary | ICD-10-CM | POA: Diagnosis not present

## 2024-04-13 DIAGNOSIS — N3090 Cystitis, unspecified without hematuria: Secondary | ICD-10-CM

## 2024-04-13 DIAGNOSIS — Z111 Encounter for screening for respiratory tuberculosis: Secondary | ICD-10-CM | POA: Diagnosis not present

## 2024-04-13 DIAGNOSIS — M06012 Rheumatoid arthritis without rheumatoid factor, left shoulder: Secondary | ICD-10-CM

## 2024-04-13 DIAGNOSIS — L4050 Arthropathic psoriasis, unspecified: Secondary | ICD-10-CM | POA: Diagnosis not present

## 2024-04-13 DIAGNOSIS — J189 Pneumonia, unspecified organism: Secondary | ICD-10-CM | POA: Diagnosis not present

## 2024-04-13 MED ORDER — ALPRAZOLAM 0.25 MG PO TABS: 0.2500 mg | ORAL_TABLET | Freq: Every day | ORAL | 1 refills | Status: AC | PRN

## 2024-04-13 NOTE — Progress Notes (Signed)
 Established Patient Office Visit  Subjective   Patient ID: Ladana Chavero, female    DOB: 1933/10/11  Age: 89 y.o. MRN: [id number]  Chief Complaint  Patient presents with   Hospitalization Follow-up    HPI Discussed the use of AI scribe software for clinical note transcription with the patient, who gave verbal consent to proceed.  History of Present Illness   Coretha Creswell is an 89 year old female who presents for follow-up after hospitalization for pneumonia.  She was recently hospitalized for pneumonia, confirmed by a CT scan despite not showing on a chest x-ray. The other possibility was that she had pneumonitis from chronic macrodantin  that she has been taking for three years to prevent bladder infections.  She received prednisone  and ABX in the hospital and required oxygen in the hospital.  She received oxygen tanks to her house but she has not used them,. She notes improvement in her condition since discharge.She has been taking macrodantin  for urinary tract infections for over three years but has stopped as it might have contributed to her lung condition.  She is currently taking Myrbetriq  for bladder issues and reports no recent urinary tract infections.She will follow up with Urology for a different ABX to prevent UTIs.    Her thyroid  levels showed a decrease in TSH. This condition will be monitored with repeat testing in six to eight weeks. Currently, no treatment is required.  During her hospital stay, she experienced a significant increase in blood sugar levels, reaching 400, which she attributes to a five-day course of prednisone . Her blood sugar levels have improved since stopping prednisone .  She experienced severe neck pain, described as similar to shingles pain, which was managed with prednisone  and Flexeril 5mg  during her hospital stay. X-rays showed arthritis and possibly rheumatoid arthritis, which she was previously unaware of. She takes Taltz injections  monthly for psoriatic arthritis and is unsure of their efficacy.  She has a history of smoking, which may contribute to her respiratory issues. She recalls a past pulmonary function test that showed good breathing capacity.  She experiences occasional rapid heartbeats, with her heart rate reaching 91 bpm, and mild swelling in her feet, which she manages by keeping them elevated.  She takes tramadol  for pain and Ativan for anxiety, using them sparingly. She also plans to get a flu shot in October so that it will last through the winter.      Lataja Newland is an 89 year old female who presents for follow-up after hospitalization for pneumonia.  She was recently hospitalized for pneumonia, confirmed by a CT scan despite not showing on a chest x-ray. She received treatment in the hospital and continued care at home, where oxygen tanks were brought but not used, and she received antibiotics. She notes improvement in her condition since discharge.  Her thyroid  levels showed a decrease in TSH. This condition will be monitored with repeat testing in six to eight weeks. Currently, no treatment is required.  She has been taking macrodantin  for urinary tract infections for over three years but has stopped as it might have contributed to her lung infection. She is currently taking Myrbetriq  for bladder issues and reports no recent urinary tract infections.  During her hospital stay, she experienced a significant increase in blood sugar levels, reaching 400, which she attributes to a five-day course of prednisone . Her blood sugar levels have improved since stopping prednisone .  She experienced severe neck pain, described as similar to shingles pain, which was managed with  prednisone  and Flexeril during her hospital stay. X-rays showed arthritis and possibly rheumatoid arthritis, which she was previously unaware of. She takes Taltz injections monthly for psoriatic arthritis and is unsure of their  efficacy.  She has a history of smoking, which may contribute to her respiratory issues. She recalls a past pulmonary function test that showed good breathing capacity.  She experiences occasional rapid heartbeats, with her heart rate reaching 91 bpm, and mild swelling in her feet, which she manages by keeping them elevated.  She takes tramadol  for pain and Ativan for anxiety, using them sparingly. She also plans to get a flu shot in October to last through the winter.   Objective:     BP (!) 144/77 (BP Location: Left Arm, Patient Position: Sitting, Cuff Size: Normal)   Pulse 90   Temp 98.4 F (36.9 C) (Oral)   Resp 18   Ht 5' 6 (1.676 m)   Wt 157 lb (71.2 kg)   SpO2 94%   BMI 25.34 kg/m    Physical Exam Vitals and nursing note reviewed.  Constitutional:      Appearance: Normal appearance.  HENT:     Head: Normocephalic and atraumatic.  Eyes:     Conjunctiva/sclera: Conjunctivae normal.  Cardiovascular:     Rate and Rhythm: Normal rate and regular rhythm.  Pulmonary:     Effort: Pulmonary effort is normal.     Breath sounds: Normal breath sounds.  Musculoskeletal:     Right lower leg: No edema.     Left lower leg: No edema.  Skin:    General: Skin is warm and dry.  Neurological:     Mental Status: She is alert and oriented to person, place, and time.  Psychiatric:        Mood and Affect: Mood normal.        Behavior: Behavior normal.        Thought Content: Thought content normal.        Judgment: Judgment normal.          No results found for any visits on 04/13/24.    The ASCVD Risk score (Arnett DK, et al., 2019) failed to calculate for the following reasons:   The 2019 ASCVD risk score is only valid for ages 40 to 94    Assessment & Plan:  Psoriatic arthritis [facility]) Assessment & Plan: Checking her CMP, CBC and TB screening for her Rheumatologist,  She is on Taltz.  Orders: -     CMP14+EGFR -     CBC with Differential/Platelet  Generalized  anxiety disorder -     ALPRAZolam ; Take 1 tablet (0.25 mg total) by mouth daily as needed. for anxiety  Dispense: 30 tablet; Refill: 1  Rheumatoid arthritis involving both shoulders with negative rheumatoid factor (HCC)  Screening for tuberculosis -     QuantiFERON-TB Gold Plus  Type II diabetes mellitus with complication (HCC) Assessment & Plan: BS got up to 500 in the hospital because she was taking a steroid burst.  This has resolved.     Community acquired pneumonia, unspecified laterality Assessment & Plan: SOB and low saturations resolved with ABX and prednsione.  Question whether macrodantin  may have caused presentation.  Stopped macrodantin  and refer back to Urology for a different ABX for prevention of bladder infections.  She is allergic to sulfa .         Return in about 4 weeks (around 05/11/2024).    Thelma Viana K Briggitte Boline, MD

## 2024-04-16 ENCOUNTER — Ambulatory Visit: Admitting: Urology

## 2024-04-16 DIAGNOSIS — J189 Pneumonia, unspecified organism: Secondary | ICD-10-CM | POA: Insufficient documentation

## 2024-04-16 NOTE — Assessment & Plan Note (Signed)
 BS got up to 500 in the hospital because she was taking a steroid burst.  This has resolved.

## 2024-04-16 NOTE — Assessment & Plan Note (Signed)
 Checking her CMP, CBC and TB screening for her Rheumatologist,  She is on Taltz.

## 2024-04-16 NOTE — Assessment & Plan Note (Signed)
 SOB and low saturations resolved with ABX and prednsione.  Question whether macrodantin  may have caused presentation.  Stopped macrodantin  and refer back to Urology for a different ABX for prevention of bladder infections.  She is allergic to sulfa .

## 2024-04-17 ENCOUNTER — Ambulatory Visit: Payer: Self-pay | Admitting: Family Medicine

## 2024-04-17 LAB — CBC WITH DIFFERENTIAL/PLATELET
Basophils Absolute: 0 x10E3/uL (ref 0.0–0.2)
Basos: 0 %
EOS (ABSOLUTE): 0.1 x10E3/uL (ref 0.0–0.4)
Eos: 1 %
Hematocrit: 37.8 % (ref 34.0–46.6)
Hemoglobin: 12.3 g/dL (ref 11.1–15.9)
Immature Grans (Abs): 0 x10E3/uL (ref 0.0–0.1)
Immature Granulocytes: 0 %
Lymphocytes Absolute: 1.7 x10E3/uL (ref 0.7–3.1)
Lymphs: 26 %
MCH: 29.8 pg (ref 26.6–33.0)
MCHC: 32.5 g/dL (ref 31.5–35.7)
MCV: 92 fL (ref 79–97)
Monocytes Absolute: 0.5 x10E3/uL (ref 0.1–0.9)
Monocytes: 8 %
Neutrophils Absolute: 4.3 x10E3/uL (ref 1.4–7.0)
Neutrophils: 65 %
Platelets: 196 x10E3/uL (ref 150–450)
RBC: 4.13 x10E6/uL (ref 3.77–5.28)
RDW: 13.1 % (ref 11.7–15.4)
WBC: 6.7 x10E3/uL (ref 3.4–10.8)

## 2024-04-17 LAB — QUANTIFERON-TB GOLD PLUS
QuantiFERON Mitogen Value: 4.04 [IU]/mL
QuantiFERON Nil Value: 0.04 [IU]/mL
QuantiFERON TB1 Ag Value: 0.05 [IU]/mL
QuantiFERON TB2 Ag Value: 0.05 [IU]/mL
QuantiFERON-TB Gold Plus: NEGATIVE

## 2024-04-17 LAB — CMP14+EGFR
ALT: 10 IU/L (ref 0–32)
AST: 14 IU/L (ref 0–40)
Albumin: 4.2 g/dL (ref 3.7–4.7)
Alkaline Phosphatase: 92 IU/L (ref 44–121)
BUN/Creatinine Ratio: 21 (ref 12–28)
BUN: 16 mg/dL (ref 8–27)
Bilirubin Total: 0.4 mg/dL (ref 0.0–1.2)
CO2: 21 mmol/L (ref 20–29)
Calcium: 9.3 mg/dL (ref 8.7–10.3)
Chloride: 104 mmol/L (ref 96–106)
Creatinine, Ser: 0.75 mg/dL (ref 0.57–1.00)
Globulin, Total: 2.8 g/dL (ref 1.5–4.5)
Glucose: 142 mg/dL — ABNORMAL HIGH (ref 70–99)
Potassium: 4.6 mmol/L (ref 3.5–5.2)
Sodium: 141 mmol/L (ref 134–144)
Total Protein: 7 g/dL (ref 6.0–8.5)
eGFR: 77 mL/min/1.73 (ref >59)

## 2024-04-26 NOTE — Telephone Encounter (Signed)
 Copied from CRM [id number]. Topic: Appointments - Appointment Scheduling >> Apr 20, 2024  1:25 PM Delon DASEN wrote: Patient needs to schedule Medicare Annual Wellness visit, system would not allow me to schedule- [phone]

## 2024-04-27 NOTE — Progress Notes (Signed)
 04/30/2024 2:44 PM   Jordan Montoya [birth year] [id number]  Referring provider: Justus Leita DEL, MD [address] [place],  [place] [address]  Urological history: 1. rUTI's -March 25, 2024 - MUF  2. Mixed incontinence - Myrbetriq  50 mg daily - incontinence depends - vaginal estrogen cream three nights  weekly   Chief Complaint  Patient presents with   Follow-up   UTI Symptoms   HPI: Jordan Montoya is a 89 y.o. woman who presents today for yearly visit.    Previous records reviewed.  After her last hospitalization for acute hypoxic respiratory failure in August, she has been advised not to take the nitrofurantoin  anymore.  She is nervous that she will develop recurrent UTI since she cannot be on a prophylactic antibiotic.  She endorses good control of her incontinence on Myrbetriq  50 mg daily.  Patient denies any modifying or aggravating factors.  Patient denies any recent UTI's, gross hematuria, dysuria or suprapubic/flank pain.  Patient denies any fevers, chills, nausea or vomiting.    She has not been applying the vaginal estrogen cream.  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/25/2014   Psoriasis 11/25/2014   followed by Dermatology    PSVT (paroxysmal supraventricular tachycardia) 01/27/2021   Shingles 11/2018   Type 2 diabetes mellitus with hyperosmolarity without coma, without long-term current use of insulin  (HCC) 09/30/2015   Unspecified septicemia(038.9) (HCC) 02/28/2019    Surgical History: Past  Surgical History:  Procedure Laterality Date   ABDOMINAL HYSTERECTOMY     BREAST BIOPSY Left    neg-bx/clip   BREAST CYST EXCISION Left    neg   EYE SURGERY Bilateral    cataracts   PARTIAL HYSTERECTOMY     one ovary remains    Home Medications:  Allergies as of 04/30/2024       Reactions   Macrobid  [nitrofurantoin ] Other (See Comments)   Avoid use due to lung injury    Metformin Nausea Only        Medication List        Accurate as of April 30, 2024  2:44 PM. If you have any questions, ask your nurse or doctor.          ALPRAZolam  0.25 MG tablet Commonly known as: XANAX  Take 1 tablet (0.25 mg total) by mouth daily as needed. for anxiety   atorvastatin  10 MG tablet Commonly known as: LIPITOR Take 1 tablet (10 mg total) by mouth daily.   Azelastine  HCl 0.15 % Soln Commonly known as: Astepro  Place 1 spray into the nose 2 (two) times daily.   celecoxib  50 MG capsule Commonly known as: CeleBREX  Take 1 capsule (50 mg total) by mouth daily as needed for pain.   docusate sodium  100 MG capsule Commonly known as: COLACE Take 200 mg by mouth 2 (two) times daily as needed for mild constipation.   estradiol  0.1 MG/GM vaginal cream Commonly known as: ESTRACE  0.5MG  (PEA-SIZED AMOUNT) JUST INSIDE THE VAGINAL INTROITUS WITH A FINGER-TIP ON MON,WED,FRI NIGHTS What changed:  Another medication with the same name was added. Make sure you understand how and when to take each.   estradiol  0.1 MG/GM vaginal cream Commonly known as: ESTRACE  VAGINAL Apply 0.5mg  (pea-sized amount)  just inside the vaginal introitus with a finger-tip every night for 30 days and then continue on Monday, Wednesday and Friday nights. What changed: You were already taking a medication with the same name, and this prescription was added. Make sure you understand how and when to take each.   fluticasone  50 MCG/ACT nasal spray Commonly known as: FLONASE  INSTILL 1 SPRAY INTO BOTH NOSTRILS DAILY    lidocaine 5 % Commonly known as: LIDODERM Place 1 patch onto the skin daily.   Myrbetriq  50 MG Tb24 tablet Generic drug: mirabegron  ER TAKE 1 TABLET BY MOUTH EVERY DAY   nitrofurantoin  50 MG capsule Commonly known as: MACRODANTIN  Take 1 capsule (50 mg total) by mouth every other day.   pregabalin  100 MG capsule Commonly known as: LYRICA  Take 1 capsule (100 mg total) by mouth 2 (two) times daily.   pregabalin  100 MG capsule Commonly known as: Lyrica  Take 1 capsule (100 mg total) by mouth 2 (two) times daily.   PROBIOTIC-10 PO Take by mouth.   sitaGLIPtin  100 MG tablet Commonly known as: Januvia  Take 1 tablet (100 mg total) by mouth daily.   TALTZ [place] Inject into the skin every 30 (thirty) days.   traMADol  50 MG tablet Commonly known as: ULTRAM  Take 1 tablet (50 mg total) by mouth every 12 (twelve) hours as needed for moderate pain (pain score 4-6).   TYLENOL  ARTHRITIS EXT RELIEF PO Take by mouth daily as needed.   Vitamin D  (Ergocalciferol ) 1.25 MG (50000 UNIT) Caps capsule Commonly known as: DRISDOL Take 50,000 Units by mouth every 7 (seven) days.        Allergies:  Allergies  Allergen Reactions   Macrobid  [Nitrofurantoin ] Other (See Comments)    Avoid use due to lung injury    Metformin Nausea Only    Family History: Family History  Problem Relation Age of Onset   Cancer Mother    Heart disease Father    Diabetes Son    Breast cancer Neg Hx     Social History:  reports that she quit smoking about 44 years ago. Her smoking use included cigarettes. She started smoking about 64 years ago. She has a 5 pack-year smoking history. She has been exposed to tobacco smoke. She has never used smokeless tobacco. She reports that she does not drink alcohol and does not use drugs.  ROS: Pertinent ROS in HPI  Physical Exam: BP (!) 170/73 (BP Location: Left Arm, Patient Position: Sitting, Cuff Size: Normal)   Pulse 91   Wt 156 lb (70.8 kg)   SpO2 94%   BMI  25.18 kg/m   Constitutional:  Well nourished. Alert and oriented, No acute distress. HEENT: [place] AT, moist mucus membranes.  Trachea midline Cardiovascular: No clubbing, cyanosis, or edema. Respiratory: Normal respiratory effort, no increased work of breathing. Neurologic: Grossly intact, no focal deficits, moving all 4 extremities. Psychiatric: Normal mood and affect.    Laboratory Data: See HPI and EPIC I have reviewed the labs.   Pertinent Imaging:  04/30/24 14:18  Scan Result 14mL    Assessment & Plan:    1. rUTI's -Explained to her a vaginal atrophy contributed to recurrent UTIs and encouraged her to start applying the vaginal estrogen cream and she is in agreement  - She will start the vaginal  estrogen cream applied a blueberry sized with a fingertip to just inside the vaginal introitus every night for 30 days and then continue on Monday, Wednesday and Friday evenings   2. Mixed incontinence - at goal on Myrbetriq  50 mg daily    3. Vaginal atrophy - apply vaginal estrogen cream every night for 30 days and then continue with Monday, Wednesday and Friday nights                                          Return in about 1 year (around 04/30/2025) for OAB questionnaire and PVR.  These notes generated with voice recognition software. I apologize for typographical errors.  CLOTILDA HELON RIGGERS  [facility] [address]  [address] [place], [place] [address] [phone]

## 2024-04-30 ENCOUNTER — Ambulatory Visit: Admitting: Urology

## 2024-04-30 ENCOUNTER — Encounter: Payer: Self-pay | Admitting: Urology

## 2024-04-30 VITALS — BP 170/73 | HR 91 | Wt 156.0 lb

## 2024-04-30 DIAGNOSIS — N390 Urinary tract infection, site not specified: Secondary | ICD-10-CM

## 2024-04-30 DIAGNOSIS — N3946 Mixed incontinence: Secondary | ICD-10-CM | POA: Diagnosis not present

## 2024-04-30 DIAGNOSIS — N952 Postmenopausal atrophic vaginitis: Secondary | ICD-10-CM | POA: Diagnosis not present

## 2024-04-30 LAB — BLADDER SCAN AMB NON-IMAGING

## 2024-04-30 MED ORDER — ESTRADIOL 0.1 MG/GM VA CREA: TOPICAL_CREAM | VAGINAL | 12 refills | Status: AC

## 2024-05-07 ENCOUNTER — Ambulatory Visit: Admitting: Nurse Practitioner

## 2024-05-07 ENCOUNTER — Encounter: Payer: Self-pay | Admitting: Nurse Practitioner

## 2024-05-07 VITALS — BP 134/76 | HR 86 | Temp 97.6°F | Ht 66.0 in | Wt 159.2 lb

## 2024-05-07 DIAGNOSIS — J984 Other disorders of lung: Secondary | ICD-10-CM | POA: Diagnosis not present

## 2024-05-07 DIAGNOSIS — J9601 Acute respiratory failure with hypoxia: Secondary | ICD-10-CM

## 2024-05-07 DIAGNOSIS — M06012 Rheumatoid arthritis without rheumatoid factor, left shoulder: Secondary | ICD-10-CM | POA: Diagnosis not present

## 2024-05-07 DIAGNOSIS — M06011 Rheumatoid arthritis without rheumatoid factor, right shoulder: Secondary | ICD-10-CM

## 2024-05-07 DIAGNOSIS — J9600 Acute respiratory failure, unspecified whether with hypoxia or hypercapnia: Secondary | ICD-10-CM | POA: Insufficient documentation

## 2024-05-07 NOTE — Assessment & Plan Note (Signed)
 Potential for CT ILD. Will reassess imaging with HRCT chest. On [place]. Follow up with rheum as scheduled

## 2024-05-07 NOTE — Assessment & Plan Note (Signed)
 Resolved

## 2024-05-07 NOTE — Progress Notes (Deleted)
 @Patient  ID: Jordan Montoya, female    DOB: August 11, 1933, 89 y.o.   MRN: [id number]  Chief Complaint  Patient presents with   Medical Management of Chronic Issues    No breathing problems.     Referring provider: Ziglar, Susan K, MD  HPI: 89 year old female, former smoker   TEST/EVENTS:   Allergies  Allergen Reactions   Macrobid  [Nitrofurantoin ] Other (See Comments)    Avoid use due to lung injury    Metformin Nausea Only    Immunization History  Administered Date(s) Administered   Fluad Quad(high Dose 65+) 05/24/2019, 05/05/2022   Fluad Trivalent(High Dose 65+) 04/18/2023   INFLUENZA, HIGH DOSE SEASONAL PF 05/01/2018, 06/03/2020, 05/08/2021   Influenza, Seasonal, Injecte, Preservative Fre 05/22/2002   Influenza-Unspecified 05/04/2016, 05/24/2017, 05/04/2018   Moderna Sars-Covid-2 Vaccination 10/01/2019, 10/29/2019, 07/02/2020   Pneumococcal Conjugate-13 06/06/2014   Pneumococcal Polysaccharide-23 06/16/2017   Tdap 08/20/2011   Zoster Recombinant(Shingrix) 11/21/2019, 12/06/2019, 03/03/2020    Past Medical History:  Diagnosis Date   Allergy    Anxiety    Avitaminosis D 11/25/2014   Environmental and seasonal allergies 05/30/2015   HLD (hyperlipidemia) 08/20/2011   Overview:  LDL goal <100 given DM2    Major depressive disorder with single episode, in partial remission 11/25/2014   Psoriasis 11/25/2014   followed by Dermatology    PSVT (paroxysmal supraventricular tachycardia) 01/27/2021   Shingles 11/2018   Type 2 diabetes mellitus with hyperosmolarity without coma, without long-term current use of insulin  (HCC) 09/30/2015   Unspecified septicemia(038.9) (HCC) 02/28/2019    Tobacco History: Social History   Tobacco Use  Smoking Status Former   Current packs/day: 0.00   Average packs/day: 0.3 packs/day for 20.0 years (5.0 ttl pk-yrs)   Types: Cigarettes   Start date: 10/10/1959   Quit date: 10/10/1979   Years since quitting: 44.6   Passive exposure:  Past  Smokeless Tobacco Never  Tobacco Comments   Smoking cessation not required   Counseling given: Not Answered Tobacco comments: Smoking cessation not required   Outpatient Medications Prior to Visit  Medication Sig Dispense Refill   Acetaminophen  (TYLENOL  ARTHRITIS EXT RELIEF PO) Take by mouth daily as needed.     ALPRAZolam  (XANAX ) 0.25 MG tablet Take 1 tablet (0.25 mg total) by mouth daily as needed. for anxiety 30 tablet 1   atorvastatin  (LIPITOR) 10 MG tablet Take 1 tablet (10 mg total) by mouth daily. 90 tablet 3   celecoxib  (CELEBREX ) 50 MG capsule Take 1 capsule (50 mg total) by mouth daily as needed for pain. 30 capsule 1   docusate sodium  (COLACE) 100 MG capsule Take 200 mg by mouth 2 (two) times daily as needed for mild constipation.     estradiol  (ESTRACE  VAGINAL) 0.1 MG/GM vaginal cream Apply 0.5mg  (pea-sized amount)  just inside the vaginal introitus with a finger-tip every night for 30 days and then continue on Monday, Wednesday and Friday nights. 42.5 g 12   estradiol  (ESTRACE ) 0.1 MG/GM vaginal cream 0.5MG  (PEA-SIZED AMOUNT) JUST INSIDE THE VAGINAL INTROITUS WITH A FINGER-TIP ON MON,WED,FRI NIGHTS 42.5 g 12   fluticasone  (FLONASE ) 50 MCG/ACT nasal spray INSTILL 1 SPRAY INTO BOTH NOSTRILS DAILY 16 mL 2   Ixekizumab (TALTZ [place]) Inject into the skin every 30 (thirty) days.     lidocaine (LIDODERM) 5 % Place 1 patch onto the skin daily. (Patient taking differently: Place 1 patch onto the skin daily. PRN)     MYRBETRIQ  50 MG TB24 tablet TAKE 1 TABLET BY MOUTH  EVERY DAY 90 tablet 3   pregabalin  (LYRICA ) 100 MG capsule Take 1 capsule (100 mg total) by mouth 2 (two) times daily. 60 capsule 0   pregabalin  (LYRICA ) 100 MG capsule Take 1 capsule (100 mg total) by mouth 2 (two) times daily. 180 capsule 0   sitaGLIPtin  (JANUVIA ) 100 MG tablet Take 1 tablet (100 mg total) by mouth daily. 90 tablet 1   traMADol  (ULTRAM ) 50 MG tablet Take 1 tablet (50 mg total) by mouth every 12 (twelve)  hours as needed for moderate pain (pain score 4-6). 15 tablet 0   Vitamin D , Ergocalciferol , (DRISDOL) 1.25 MG (50000 UNIT) CAPS capsule Take 50,000 Units by mouth every 7 (seven) days.     Azelastine  HCl (ASTEPRO ) 0.15 % SOLN Place 1 spray into the nose 2 (two) times daily. (Patient not taking: Reported on 05/07/2024) 30 mL 2   nitrofurantoin  (MACRODANTIN ) 50 MG capsule Take 1 capsule (50 mg total) by mouth every other day. (Patient not taking: Reported on 05/07/2024) 90 capsule 3   Probiotic Product (PROBIOTIC-10 PO) Take by mouth. (Patient not taking: Reported on 05/07/2024)     No facility-administered medications prior to visit.     Review of Systems:   Constitutional: No weight loss or gain, night sweats, fevers, chills, fatigue, or lassitude. HEENT: No headaches, difficulty swallowing, tooth/dental problems, or sore throat. No sneezing, itching, ear ache, nasal congestion, or post nasal drip CV:  No chest pain, orthopnea, PND, swelling in lower extremities, anasarca, dizziness, palpitations, syncope Resp: No shortness of breath with exertion or at rest. No excess mucus or change in color of mucus. No productive or non-productive. No hemoptysis. No wheezing.  No chest wall deformity GI:  No heartburn, indigestion, abdominal pain, nausea, vomiting, diarrhea, change in bowel habits, loss of appetite, bloody stools.  GU: No dysuria, change in color of urine, urgency or frequency.  No flank pain, no hematuria  Skin: No rash, lesions, ulcerations MSK:  No joint pain or swelling.  No decreased range of motion.  No back pain. Neuro: No dizziness or lightheadedness.  Psych: No depression or anxiety. Mood stable.     Physical Exam:  BP 134/76   Pulse 86   Temp 97.6 F (36.4 C) (Temporal)   Ht 5' 6 (1.676 m)   Wt 159 lb 3.2 oz (72.2 kg)   SpO2 96%   BMI 25.70 kg/m   GEN: Pleasant, interactive, well-nourished/chronically-ill appearing/acutely-ill appearing/poorly-nourished/morbidly  obese; in no acute distress.****** HEENT:  Normocephalic and atraumatic. EACs patent bilaterally. TM pearly gray with present light reflex bilaterally. PERRLA. Sclera white. Nasal turbinates pink, moist and patent bilaterally. No rhinorrhea present. Oropharynx pink and moist, without exudate or edema. No lesions, ulcerations, or postnasal drip.  NECK:  Supple w/ fair ROM. No JVD present. Normal carotid impulses w/o bruits. Thyroid  symmetrical with no goiter or nodules palpated. No lymphadenopathy.   CV: RRR, no m/r/g, no peripheral edema. Pulses intact, +2 bilaterally. No cyanosis, pallor or clubbing. PULMONARY:  Unlabored, regular breathing. Clear bilaterally A&P w/o wheezes/rales/rhonchi. No accessory muscle use.  GI: BS present and normoactive. Soft, non-tender to palpation. No organomegaly or masses detected. No CVA tenderness. MSK: No erythema, warmth or tenderness. Cap refil <2 sec all extrem. No deformities or joint swelling noted.  Neuro: A/Ox3. No focal deficits noted.   Skin: Warm, no lesions or rashe Psych: Normal affect and behavior. Judgement and thought content appropriate.     Lab Results:  CBC    Component Value Date/Time  WBC 6.7 04/13/2024 1132   WBC 5.1 04/14/2020 1029   RBC 4.13 04/13/2024 1132   RBC 4.11 04/14/2020 1029   HGB 12.3 04/13/2024 1132   HCT 37.8 04/13/2024 1132   PLT 196 04/13/2024 1132   MCV 92 04/13/2024 1132   MCV 90 04/29/2014 1340   MCH 29.8 04/13/2024 1132   MCH 29.2 04/14/2020 1029   MCHC 32.5 04/13/2024 1132   MCHC 32.8 04/14/2020 1029   RDW 13.1 04/13/2024 1132   RDW 13.9 04/29/2014 1340   LYMPHSABS 1.7 04/13/2024 1132   LYMPHSABS 2.0 04/29/2014 1340   MONOABS 0.5 04/14/2020 1029   MONOABS 0.6 04/29/2014 1340   EOSABS 0.1 04/13/2024 1132   EOSABS 0.1 04/29/2014 1340   BASOSABS 0.0 04/13/2024 1132   BASOSABS 0.0 04/29/2014 1340    BMET    Component Value Date/Time   NA 141 04/13/2024 1132   NA 141 04/29/2014 1340   K 4.6  04/13/2024 1132   K 4.2 04/29/2014 1340   CL 104 04/13/2024 1132   CL 106 04/29/2014 1340   CO2 21 04/13/2024 1132   CO2 25 04/29/2014 1340   GLUCOSE 142 (H) 04/13/2024 1132   GLUCOSE 169 (H) 04/14/2020 1029   GLUCOSE 163 (H) 04/29/2014 1340   BUN 16 04/13/2024 1132   BUN 15 04/29/2014 1340   CREATININE 0.75 04/13/2024 1132   CREATININE 0.91 04/29/2014 1340   CALCIUM  9.3 04/13/2024 1132   CALCIUM  8.9 04/29/2014 1340   GFRNONAA 59 (L) 04/14/2020 1029   GFRNONAA >60 04/29/2014 1340   GFRAA >60 04/14/2020 1029   GFRAA >60 04/29/2014 1340    BNP    Component Value Date/Time   BNP 11.4 03/19/2024 1427     Imaging:  No results found.  Administration History     None           No data to display          No results found for: NITRICOXIDE      Assessment & Plan:   No problem-specific Assessment & Plan notes found for this encounter.   Advised if symptoms do not improve or worsen, to please contact office for sooner follow up or seek emergency care.   I spent *** minutes of dedicated to the care of this patient on the date of this encounter to include pre-visit review of records, face-to-face time with the patient discussing conditions above, post visit ordering of testing, clinical documentation with the electronic health record, making appropriate referrals as documented, and communicating necessary findings to members of the patients care team.  Comer LULLA Rouleau, NP 05/07/2024  Pt aware and understands NP's role.

## 2024-05-07 NOTE — Progress Notes (Signed)
 @Patient  ID: Jordan Montoya, female    DOB: 06-Jul-1934, 89 y.o.   MRN: [id number]  Chief Complaint  Patient presents with   Medical Management of Chronic Issues    No breathing problems.     Referring provider: Ziglar, Susan K, MD  HPI: 89 year old female, former remote smoker followed for drug induced pneumonitis in setting of chronic macrobid  use. Past medical history significant for HTN, DM, HLD, post herpetic neuralgia, RA, psoriatic arthritis, anxiety.   TEST/EVENTS:  06/04/2021 PFT: FVC 76, FEV1 63, ratio 81, TLC 74, DLCO 75 corrects to 85 for alveolar volume. Positive bronchodilator response 03/25/2024 CTA chest: bilateral groundglass opacities of lung parenchyma. No PE. RLL nodule 8 mm. No LAD.   08/18/2021: OV with Dr. Shellia. Hyperinflation on imaging. PFTs unremarkable. No significant DOE. Chronic rhinitis symptoms; advised on nasal sprays.   05/07/2024: Today - follow up Discussed the use of AI scribe software for clinical note transcription with the patient, who gave verbal consent to proceed.  History of Present Illness Jordan Montoya is an 89 year old female who presents for follow-up after hospitalization for respiratory issues.   In August, she was hospitalized due to respiratory issues initially suspected to be either pneumonia or airway inflammation, potentially caused by long-term antibiotic use. She had been on macrobid  prescribed by her urologist for recurrent urinary tract infections. It was discontinued due to its potential respiratory side effects.  During her hospitalization, she was treated with antibiotics and steroids. She feels 'back to normal' and denies current symptoms of coughing or shortness of breath. No high fevers or hemoptysis during her illness. She was discharged on oxygen but did not use it. She's not been monitoring her oxygen levels at home.   She has been told she has emphysema in the past, but she expresses doubt about this  diagnosis. She has a very remote smoking history. She's never been on inhalers.  Her son is with her today and helps to corroborate her history.    Allergies  Allergen Reactions   Macrobid  [Nitrofurantoin ] Other (See Comments)    Avoid use due to lung injury    Metformin Nausea Only    Immunization History  Administered Date(s) Administered   Fluad Quad(high Dose 65+) 05/24/2019, 05/05/2022   Fluad Trivalent(High Dose 65+) 04/18/2023   INFLUENZA, HIGH DOSE SEASONAL PF 05/01/2018, 06/03/2020, 05/08/2021   Influenza, Seasonal, Injecte, Preservative Fre 05/22/2002   Influenza-Unspecified 05/04/2016, 05/24/2017, 05/04/2018   Moderna Sars-Covid-2 Vaccination 10/01/2019, 10/29/2019, 07/02/2020   Pneumococcal Conjugate-13 06/06/2014   Pneumococcal Polysaccharide-23 06/16/2017   Tdap 08/20/2011   Zoster Recombinant(Shingrix) 11/21/2019, 12/06/2019, 03/03/2020    Past Medical History:  Diagnosis Date   Allergy    Anxiety    Avitaminosis D 11/25/2014   Environmental and seasonal allergies 05/30/2015   HLD (hyperlipidemia) 08/20/2011   Overview:  LDL goal <100 given DM2    Major depressive disorder with single episode, in partial remission 11/25/2014   Psoriasis 11/25/2014   followed by Dermatology    PSVT (paroxysmal supraventricular tachycardia) 01/27/2021   Shingles 11/2018   Type 2 diabetes mellitus with hyperosmolarity without coma, without long-term current use of insulin  (HCC) 09/30/2015   Unspecified septicemia(038.9) (HCC) 02/28/2019    Tobacco History: Social History   Tobacco Use  Smoking Status Former   Current packs/day: 0.00   Average packs/day: 0.3 packs/day for 20.0 years (5.0 ttl pk-yrs)   Types: Cigarettes   Start date: 10/10/1959   Quit date: 10/10/1979  Years since quitting: 44.6   Passive exposure: Past  Smokeless Tobacco Never  Tobacco Comments   Smoking cessation not required   Counseling given: Not Answered Tobacco comments: Smoking cessation  not required   Outpatient Medications Prior to Visit  Medication Sig Dispense Refill   Acetaminophen  (TYLENOL  ARTHRITIS EXT RELIEF PO) Take by mouth daily as needed.     ALPRAZolam  (XANAX ) 0.25 MG tablet Take 1 tablet (0.25 mg total) by mouth daily as needed. for anxiety 30 tablet 1   atorvastatin  (LIPITOR) 10 MG tablet Take 1 tablet (10 mg total) by mouth daily. 90 tablet 3   celecoxib  (CELEBREX ) 50 MG capsule Take 1 capsule (50 mg total) by mouth daily as needed for pain. 30 capsule 1   docusate sodium  (COLACE) 100 MG capsule Take 200 mg by mouth 2 (two) times daily as needed for mild constipation.     estradiol  (ESTRACE  VAGINAL) 0.1 MG/GM vaginal cream Apply 0.5mg  (pea-sized amount)  just inside the vaginal introitus with a finger-tip every night for 30 days and then continue on Monday, Wednesday and Friday nights. 42.5 g 12   estradiol  (ESTRACE ) 0.1 MG/GM vaginal cream 0.5MG  (PEA-SIZED AMOUNT) JUST INSIDE THE VAGINAL INTROITUS WITH A FINGER-TIP ON MON,WED,FRI NIGHTS 42.5 g 12   fluticasone  (FLONASE ) 50 MCG/ACT nasal spray INSTILL 1 SPRAY INTO BOTH NOSTRILS DAILY 16 mL 2   Ixekizumab (TALTZ [place]) Inject into the skin every 30 (thirty) days.     lidocaine (LIDODERM) 5 % Place 1 patch onto the skin daily. (Patient taking differently: Place 1 patch onto the skin daily. PRN)     MYRBETRIQ  50 MG TB24 tablet TAKE 1 TABLET BY MOUTH EVERY DAY 90 tablet 3   pregabalin  (LYRICA ) 100 MG capsule Take 1 capsule (100 mg total) by mouth 2 (two) times daily. 60 capsule 0   pregabalin  (LYRICA ) 100 MG capsule Take 1 capsule (100 mg total) by mouth 2 (two) times daily. 180 capsule 0   sitaGLIPtin  (JANUVIA ) 100 MG tablet Take 1 tablet (100 mg total) by mouth daily. 90 tablet 1   traMADol  (ULTRAM ) 50 MG tablet Take 1 tablet (50 mg total) by mouth every 12 (twelve) hours as needed for moderate pain (pain score 4-6). 15 tablet 0   Vitamin D , Ergocalciferol , (DRISDOL) 1.25 MG (50000 UNIT) CAPS capsule Take 50,000 Units  by mouth every 7 (seven) days.     Azelastine  HCl (ASTEPRO ) 0.15 % SOLN Place 1 spray into the nose 2 (two) times daily. (Patient not taking: Reported on 05/07/2024) 30 mL 2   Probiotic Product (PROBIOTIC-10 PO) Take by mouth. (Patient not taking: Reported on 05/07/2024)     nitrofurantoin  (MACRODANTIN ) 50 MG capsule Take 1 capsule (50 mg total) by mouth every other day. (Patient not taking: Reported on 05/07/2024) 90 capsule 3   No facility-administered medications prior to visit.     Review of Systems: As above    Physical Exam:  BP 134/76   Pulse 86   Temp 97.6 F (36.4 C) (Temporal)   Ht 5' 6 (1.676 m)   Wt 159 lb 3.2 oz (72.2 kg)   SpO2 96%   BMI 25.70 kg/m   GEN: Pleasant, interactive, well-appearing; in no acute distress HEENT:  Normocephalic and atraumatic. PERRLA. Sclera white. Nasal turbinates pink, moist and patent bilaterally. No rhinorrhea present. Oropharynx pink and moist, without exudate or edema. No lesions, ulcerations, or postnasal drip.  NECK:  Supple w/ fair ROM.  CV: RRR, no m/r/g. No peripheral edema  PULMONARY:  Unlabored, regular breathing. Clear bilaterally A&P w/o wheezes/rales/rhonchi.  GI: BS present and normoactive. Soft, non-tender to palpation.  MSK: No erythema, warmth or tenderness. Cap refil <2 sec all extrem.  Neuro: A/Ox3. No focal deficits noted.   Skin: Warm, no lesions or rashe Psych: Normal affect and behavior. Judgement and thought content appropriate.     Lab Results:  CBC    Component Value Date/Time   WBC 6.7 04/13/2024 1132   WBC 5.1 04/14/2020 1029   RBC 4.13 04/13/2024 1132   RBC 4.11 04/14/2020 1029   HGB 12.3 04/13/2024 1132   HCT 37.8 04/13/2024 1132   PLT 196 04/13/2024 1132   MCV 92 04/13/2024 1132   MCV 90 04/29/2014 1340   MCH 29.8 04/13/2024 1132   MCH 29.2 04/14/2020 1029   MCHC 32.5 04/13/2024 1132   MCHC 32.8 04/14/2020 1029   RDW 13.1 04/13/2024 1132   RDW 13.9 04/29/2014 1340   LYMPHSABS 1.7  04/13/2024 1132   LYMPHSABS 2.0 04/29/2014 1340   MONOABS 0.5 04/14/2020 1029   MONOABS 0.6 04/29/2014 1340   EOSABS 0.1 04/13/2024 1132   EOSABS 0.1 04/29/2014 1340   BASOSABS 0.0 04/13/2024 1132   BASOSABS 0.0 04/29/2014 1340    BMET    Component Value Date/Time   NA 141 04/13/2024 1132   NA 141 04/29/2014 1340   K 4.6 04/13/2024 1132   K 4.2 04/29/2014 1340   CL 104 04/13/2024 1132   CL 106 04/29/2014 1340   CO2 21 04/13/2024 1132   CO2 25 04/29/2014 1340   GLUCOSE 142 (H) 04/13/2024 1132   GLUCOSE 169 (H) 04/14/2020 1029   GLUCOSE 163 (H) 04/29/2014 1340   BUN 16 04/13/2024 1132   BUN 15 04/29/2014 1340   CREATININE 0.75 04/13/2024 1132   CREATININE 0.91 04/29/2014 1340   CALCIUM  9.3 04/13/2024 1132   CALCIUM  8.9 04/29/2014 1340   GFRNONAA 59 (L) 04/14/2020 1029   GFRNONAA >60 04/29/2014 1340   GFRAA >60 04/14/2020 1029   GFRAA >60 04/29/2014 1340    BNP    Component Value Date/Time   BNP 11.4 03/19/2024 1427     Imaging:  No results found.  Administration History     None           No data to display          No results found for: NITRICOXIDE      Assessment & Plan:   Drug-induced pneumonitis Recent hospitalization due to drug induced pneumonitis in setting of chronic macrobid  therapy vs pneumonia. No infectious symptoms at time of onset. Improved following empiric abx and steroids. She has since discontinued the macrobid . She does also have a hx of RA and moderate restriction on prior PFTs. She would not be interested in repeating at this time. Will obtain repeat HRCT chest in the next 4 weeks to ensure improvement/reassess baseline. Clinically asymptomatic. Walk test today without hypoxia on room air. Encouraged to monitor respiratory status at home and remain off macrobid .   Patient Instructions  Your prior CT scan looks like you may have had inflammation in the lungs caused by the macrobid  you had been taking for your UTIs. You need  to remain off of this and discuss alternatives with your urologist.   We will repeat your CT scan in the next 4 weeks   Your oxygen levels were normal during your walk test today  Follow up in 6 weeks to review CT chest scan with any new  pulmonologist or Katie Nolen Lindamood,NP. If symptoms do not improve or worsen, please contact office for sooner follow up or seek emergency care.    Acute respiratory failure (HCC) Resolved   Rheumatoid arthritis involving both shoulders with negative rheumatoid factor (HCC) Potential for CT ILD. Will reassess imaging with HRCT chest. On [place]. Follow up with rheum as scheduled  Advised if symptoms do not improve or worsen, to please contact office for sooner follow up or seek emergency care.   I spent 35 minutes of dedicated to the care of this patient on the date of this encounter to include pre-visit review of records, face-to-face time with the patient discussing conditions above, post visit ordering of testing, clinical documentation with the electronic health record, making appropriate referrals as documented, and communicating necessary findings to members of the patients care team.  Comer LULLA Rouleau, NP 05/07/2024  Pt aware and understands NP's role.

## 2024-05-07 NOTE — Assessment & Plan Note (Addendum)
 Recent hospitalization due to drug induced pneumonitis in setting of chronic macrobid  therapy vs pneumonia. No infectious symptoms at time of onset. Improved following empiric abx and steroids. She has since discontinued the macrobid . She does also have a hx of RA and moderate restriction on prior PFTs. She would not be interested in repeating at this time. Will obtain repeat HRCT chest in the next 4 weeks to ensure improvement/reassess baseline. Clinically asymptomatic. Walk test today without hypoxia on room air. Encouraged to monitor respiratory status at home and remain off macrobid .   Patient Instructions  Your prior CT scan looks like you may have had inflammation in the lungs caused by the macrobid  you had been taking for your UTIs. You need to remain off of this and discuss alternatives with your urologist.   We will repeat your CT scan in the next 4 weeks   Your oxygen levels were normal during your walk test today  Follow up in 6 weeks to review CT chest scan with any new pulmonologist or Jordan Gisselle Galvis,NP. If symptoms do not improve or worsen, please contact office for sooner follow up or seek emergency care.

## 2024-05-07 NOTE — Patient Instructions (Signed)
 Your prior CT scan looks like you may have had inflammation in the lungs caused by the macrobid  you had been taking for your UTIs. You need to remain off of this and discuss alternatives with your urologist.   We will repeat your CT scan in the next 4 weeks   Your oxygen levels were normal during your walk test today  Follow up in 6 weeks to review CT chest scan with any new pulmonologist or Jordan Tashea Othman,NP. If symptoms do not improve or worsen, please contact office for sooner follow up or seek emergency care.

## 2024-05-14 ENCOUNTER — Ambulatory Visit: Admitting: Family Medicine

## 2024-05-14 ENCOUNTER — Encounter: Payer: Self-pay | Admitting: Family Medicine

## 2024-05-14 VITALS — BP 139/79 | HR 92 | Temp 97.7°F | Resp 18 | Ht 66.0 in | Wt 163.0 lb

## 2024-05-14 DIAGNOSIS — J984 Other disorders of lung: Secondary | ICD-10-CM | POA: Diagnosis not present

## 2024-05-14 DIAGNOSIS — F5101 Primary insomnia: Secondary | ICD-10-CM

## 2024-05-14 DIAGNOSIS — Z23 Encounter for immunization: Secondary | ICD-10-CM

## 2024-05-14 DIAGNOSIS — R251 Tremor, unspecified: Secondary | ICD-10-CM

## 2024-05-14 DIAGNOSIS — Z87440 Personal history of urinary (tract) infections: Secondary | ICD-10-CM

## 2024-05-14 DIAGNOSIS — Z7984 Long term (current) use of oral hypoglycemic drugs: Secondary | ICD-10-CM | POA: Diagnosis not present

## 2024-05-14 DIAGNOSIS — M545 Low back pain, unspecified: Secondary | ICD-10-CM

## 2024-05-14 DIAGNOSIS — E118 Type 2 diabetes mellitus with unspecified complications: Secondary | ICD-10-CM | POA: Diagnosis not present

## 2024-05-14 DIAGNOSIS — T50905A Adverse effect of unspecified drugs, medicaments and biological substances, initial encounter: Secondary | ICD-10-CM | POA: Diagnosis not present

## 2024-05-14 LAB — POCT GLYCOSYLATED HEMOGLOBIN (HGB A1C): Hemoglobin A1C: 7 % — AB (ref 4.0–5.6)

## 2024-05-14 MED ORDER — SITAGLIPTIN PHOSPHATE 100 MG PO TABS: 100.0000 mg | ORAL_TABLET | Freq: Every day | ORAL | 1 refills | Status: AC

## 2024-05-14 MED ORDER — TRAMADOL HCL 50 MG PO TABS: 50.0000 mg | ORAL_TABLET | Freq: Two times a day (BID) | ORAL | 0 refills | Status: AC | PRN

## 2024-05-15 DIAGNOSIS — R251 Tremor, unspecified: Secondary | ICD-10-CM | POA: Insufficient documentation

## 2024-05-15 DIAGNOSIS — Z23 Encounter for immunization: Secondary | ICD-10-CM | POA: Insufficient documentation

## 2024-05-15 DIAGNOSIS — Z87440 Personal history of urinary (tract) infections: Secondary | ICD-10-CM | POA: Insufficient documentation

## 2024-05-15 DIAGNOSIS — G4700 Insomnia, unspecified: Secondary | ICD-10-CM | POA: Insufficient documentation

## 2024-05-15 NOTE — Assessment & Plan Note (Signed)
 She is taking Januvia  100 mg daily as her only treatment for diabetes other than diet and exercise.  A1c today is 7.0%.  Advised this is excellent control for her age group.  She has not experienced any low blood sugars or periods of shakiness, headache, confusion, diaphoresis.  Continue Januvia  100 mg daily.

## 2024-05-15 NOTE — Assessment & Plan Note (Signed)
 Has tremor of both hands.  Has difficulty signing her name but can drink out of a glass and eat without difficulty.  Advised if it interferes with drinking or eating then we would look at treatment.

## 2024-05-15 NOTE — Assessment & Plan Note (Signed)
 Double strength flu vaccine today.

## 2024-05-15 NOTE — Progress Notes (Signed)
 Established Patient Office Visit  Subjective   Patient ID: Jordan Montoya, female    DOB: 08/05/1933  Age: 89 y.o. MRN: [id number]  Chief Complaint  Patient presents with   Medical Management of Chronic Issues    HPI Delightful 89 year old woman with rheumatoid arthritis, psoriatic arthritis, spinal stenosis, anxiety, HTN, mixed hyperlipidemia, DMT2, osteoporosis and vitamin D  deficiency.   She has been to see the pulmonologist, Ms. Cobbs and had a repeat CT scan of her lungs.  She is on no treatment.  Her breathing has gotten much better.  She denies having a cough or shortness of breath with ambulation.  She has had no wheezing.  She is not using an inhaler.  The consensus is that she had pneumonitis from exposure to Macrobid .    She has been to see the urologist and is now on estrogen cream 1 pea-sized amount to the urethra every day for 2 weeks then a pea-sized amount to the urethra Monday Wednesday and Friday.  So far she is doing well and has no symptoms of UTI. She asked for refill of tramadol .  She takes 15 a month.  Advised she can take a half at a time if she would like.  She lives alone and has a lifeline alert around her neck. She had terrible neck pain while she was hospitalized and was given 5 mg Flexeril 3 times daily.  She will so she could have a prescription for this in case she has neck pain.  Discussion about medications that might make her lose her balance and fall advised that it is safer for her not to take Flexeril, even low-dose.  Reports a new onset tremor of bilateral hands with intention.  Does not remember that either parent or sibling had anything like this.  She has difficulty writing her name but is able to drink out of a glass and eat with a fork.  Request medicine to help her go to sleep.  She has tried melatonin and is not effective for her.   Fasting blood sugar 125 this morning.  She takes Januvia  100 mg only for her diabetes.  A1c today is  7.0%  She request a flu vaccine today    Objective:     BP 139/79 (BP Location: Left Arm, Patient Position: Sitting, Cuff Size: Normal)   Pulse 92   Temp 97.7 F (36.5 C) (Oral)   Resp 18   Ht 5' 6 (1.676 m)   Wt 163 lb (73.9 kg)   SpO2 92%   BMI 26.31 kg/m    Physical Exam Vitals and nursing note reviewed.  Constitutional:      Appearance: Normal appearance.  HENT:     Head: Normocephalic and atraumatic.  Eyes:     Conjunctiva/sclera: Conjunctivae normal.  Cardiovascular:     Rate and Rhythm: Normal rate and regular rhythm.  Pulmonary:     Effort: Pulmonary effort is normal.     Breath sounds: Normal breath sounds.  Musculoskeletal:     Right lower leg: No edema.     Left lower leg: No edema.  Skin:    General: Skin is warm and dry.  Neurological:     Mental Status: She is alert and oriented to person, place, and time.  Psychiatric:        Mood and Affect: Mood normal.        Behavior: Behavior normal.        Thought Content: Thought content normal.  Judgment: Judgment normal.          Results for orders placed or performed in visit on 05/14/24  POCT glycosylated hemoglobin (Hb A1C)  Result Value Ref Range   Hemoglobin A1C 7.0 (A) 4.0 - 5.6 %   HbA1c POC (<> result, manual entry)     HbA1c, POC (prediabetic range)     HbA1c, POC (controlled diabetic range)        The ASCVD Risk score (Arnett DK, et al., 2019) failed to calculate for the following reasons:   The 2019 ASCVD risk score is only valid for ages 45 to 32    Assessment & Plan:  Immunization due Assessment & Plan: Double strength flu vaccine today.  Orders: -     Flu vaccine HIGH DOSE PF(Fluzone Trivalent)  Lumbar back pain -     traMADol  HCl; Take 1 tablet (50 mg total) by mouth every 12 (twelve) hours as needed for moderate pain (pain score 4-6).  Dispense: 15 tablet; Refill: 0  Type II diabetes mellitus with complication (HCC) Assessment & Plan: She is taking Januvia   100 mg daily as her only treatment for diabetes other than diet and exercise.  A1c today is 7.0%.  Advised this is excellent control for her age group.  She has not experienced any low blood sugars or periods of shakiness, headache, confusion, diaphoresis.  Continue Januvia  100 mg daily.  Orders: -     SITagliptin  Phosphate; Take 1 tablet (100 mg total) by mouth daily.  Dispense: 90 tablet; Refill: 1 -     POCT glycosylated hemoglobin (Hb A1C)  Drug-induced pneumonitis Assessment & Plan: With drawl of Macrobid  and her symptoms have resolved.  She is not wheezing nor she is short of breath with ambulation.  She is tired and expect that will last for several weeks.   Tremor of both hands Assessment & Plan: Has tremor of both hands.  Has difficulty signing her name but can drink out of a glass and eat without difficulty.  Advised if it interferes with drinking or eating then we would look at treatment.   History of bladder infections Assessment & Plan: Has a history of recurrent bladder infections and was treated with Macrobid .  Developed pneumonitis secondary to Macrobid .  Now on estrogen cream.  Pea-sized amount every night to urethra for 2 weeks then pea-sized amount to urethra Monday Wednesday and Friday.   Primary insomnia Assessment & Plan: Did not do well with melatonin.  She already has a Xanax  prescription that she takes for anxiety.  Concerned about giving her medications that are would make her unsteady on her feet.  Try sleepy time tea.      Return in about 8 weeks (around 07/09/2024).    Isaia Hassell K Ilyanna Baillargeon, MD

## 2024-05-15 NOTE — Assessment & Plan Note (Signed)
 Has a history of recurrent bladder infections and was treated with Macrobid .  Developed pneumonitis secondary to Macrobid .  Now on estrogen cream.  Pea-sized amount every night to urethra for 2 weeks then pea-sized amount to urethra Monday Wednesday and Friday.

## 2024-05-15 NOTE — Assessment & Plan Note (Signed)
 With drawl of Macrobid  and her symptoms have resolved.  She is not wheezing nor she is short of breath with ambulation.  She is tired and expect that will last for several weeks.

## 2024-05-15 NOTE — Assessment & Plan Note (Signed)
 Did not do well with melatonin.  She already has a Xanax  prescription that she takes for anxiety.  Concerned about giving her medications that are would make her unsteady on her feet.  Try sleepy time tea.

## 2024-05-24 ENCOUNTER — Other Ambulatory Visit: Payer: Self-pay | Admitting: Family Medicine

## 2024-05-24 DIAGNOSIS — M06011 Rheumatoid arthritis without rheumatoid factor, right shoulder: Secondary | ICD-10-CM

## 2024-05-30 ENCOUNTER — Telehealth: Payer: Self-pay | Admitting: Family Medicine

## 2024-05-30 ENCOUNTER — Other Ambulatory Visit: Payer: Self-pay | Admitting: Family Medicine

## 2024-05-30 DIAGNOSIS — T753XXD Motion sickness, subsequent encounter: Secondary | ICD-10-CM

## 2024-05-30 MED ORDER — SCOPOLAMINE 1 MG/3DAYS TD PT72: 1.0000 | MEDICATED_PATCH | TRANSDERMAL | 0 refills | Status: AC

## 2024-05-30 NOTE — Telephone Encounter (Signed)
 Copied from CRM #[id number]. Topic: Clinical - Medication Refill >> May 30, 2024 10:46 AM Larissa RAMAN wrote: Medication: scopolamine  (TRANSDERM SCOP , 1.5 MG,) 1 MG, not on current med list  Has the patient contacted their pharmacy? No (Agent: If no, request that the patient contact the pharmacy for the refill. If patient does not wish to contact the pharmacy document the reason why and proceed with request.) (Agent: If yes, when and what did the pharmacy advise?)  This is the patient's preferred pharmacy:  [facility] [phone] [address] [place], [place] - [address] [place] [address] Phone: [phone] Fax: [phone]  Is this the correct pharmacy for this prescription? Yes If no, delete pharmacy and type the correct one.   Has the prescription been filled recently? No  Is the patient out of the medication? Yes  Has the patient been seen for an appointment in the last year OR does the patient have an upcoming appointment? Yes  Can we respond through MyChart? No  Agent: Please be advised that Rx refills may take up to 3 business days. We ask that you follow-up with your pharmacy.

## 2024-05-30 NOTE — Telephone Encounter (Signed)
 She used the scopolamine  patch when she has to ride in the car for a couple of hours.  She has a history of car sickness.  She takes it off as soon as she gets where she is going.  She denies side effects of blurred vision, dizziness or sleepiness.  She is not driving, she is riding.

## 2024-06-05 ENCOUNTER — Ambulatory Visit
Admission: RE | Admit: 2024-06-05 | Discharge: 2024-06-05 | Disposition: A | Discharge status: Home / Self Care | Source: Ambulatory Visit | Attending: Nurse Practitioner | Admitting: Nurse Practitioner

## 2024-06-05 DIAGNOSIS — T50905A Adverse effect of unspecified drugs, medicaments and biological substances, initial encounter: Secondary | ICD-10-CM | POA: Insufficient documentation

## 2024-06-05 DIAGNOSIS — J984 Other disorders of lung: Secondary | ICD-10-CM | POA: Diagnosis not present

## 2024-06-05 DIAGNOSIS — R918 Other nonspecific abnormal finding of lung field: Secondary | ICD-10-CM | POA: Diagnosis not present

## 2024-06-05 DIAGNOSIS — I700 Atherosclerosis of aorta: Secondary | ICD-10-CM | POA: Diagnosis not present

## 2024-06-06 ENCOUNTER — Ambulatory Visit: Payer: Self-pay | Admitting: Nurse Practitioner

## 2024-06-06 NOTE — Progress Notes (Signed)
 Improved CT chest imaging. No underlying ILD. She does have some small, non-specific nodules and a stable 6 mm nodule in the RLL that's considered benign. No dedicated follow up necessary. She has some chronic plaque buildup in the arteries; which is a common incidental finding and can be followed by her PCP. Ok to reschedule her appt if she is still doing well. Can be seen by any new MD to establish care in February 2026 for 4 month follow up. Thanks.

## 2024-06-08 ENCOUNTER — Other Ambulatory Visit: Payer: Self-pay | Admitting: Urology

## 2024-06-18 ENCOUNTER — Ambulatory Visit: Admitting: Nurse Practitioner

## 2024-06-25 ENCOUNTER — Other Ambulatory Visit: Payer: Self-pay | Admitting: Family Medicine

## 2024-07-16 ENCOUNTER — Ambulatory Visit: Admitting: Family Medicine

## 2024-08-06 ENCOUNTER — Other Ambulatory Visit: Payer: Self-pay | Admitting: Family Medicine

## 2024-08-06 DIAGNOSIS — B0229 Other postherpetic nervous system involvement: Secondary | ICD-10-CM

## 2024-08-06 NOTE — Telephone Encounter (Unsigned)
 Copied from CRM [id number]. Topic: Clinical - Medication Refill >> Aug 06, 2024  8:32 AM Alexandria E wrote: Medication: pregabalin  (LYRICA ) 100 MG capsule  Has the patient contacted their pharmacy? Yes (Agent: If no, request that the patient contact the pharmacy for the refill. If patient does not wish to contact the pharmacy document the reason why and proceed with request.) (Agent: If yes, when and what did the pharmacy advise?)  This is the patient's preferred pharmacy:  [facility] [phone] [facility] [place], [place] - [address] [place] [address] Phone: [phone] Fax: [phone]  Is this the correct pharmacy for this prescription? Yes If no, delete pharmacy and type the correct one.   Has the prescription been filled recently? No  Is the patient out of the medication? No, one day left.  Has the patient been seen for an appointment in the last year OR does the patient have an upcoming appointment? Yes  Can we respond through MyChart? No  Agent: Please be advised that Rx refills may take up to 3 business days. We ask that you follow-up with your pharmacy.

## 2024-08-07 MED ORDER — PREGABALIN 100 MG PO CAPS: 100.0000 mg | ORAL_CAPSULE | Freq: Two times a day (BID) | ORAL | 0 refills | Status: DC

## 2024-08-31 ENCOUNTER — Ambulatory Visit

## 2024-09-05 ENCOUNTER — Other Ambulatory Visit: Payer: Self-pay | Admitting: Family Medicine

## 2024-09-05 DIAGNOSIS — B0229 Other postherpetic nervous system involvement: Secondary | ICD-10-CM

## 2024-09-06 ENCOUNTER — Telehealth: Payer: Self-pay | Admitting: Family Medicine

## 2024-09-06 NOTE — Telephone Encounter (Signed)
 Pt called to report that she only has two pills left, please advise. She is requesting 3 months supply.

## 2024-09-06 NOTE — Telephone Encounter (Signed)
 Advised that I cannot give her Lyrica  for three months because it is a controlled medication.    She does not want to come in for her Medicare Wellness Examination.  She wants to know if she can get this over the phone.

## 2024-09-07 ENCOUNTER — Other Ambulatory Visit: Payer: Self-pay | Admitting: Family Medicine

## 2024-10-04 ENCOUNTER — Ambulatory Visit: Admitting: Family Medicine

## 2024-10-26 ENCOUNTER — Ambulatory Visit

## 2025-05-06 ENCOUNTER — Ambulatory Visit: Admitting: Urology
